# Patient Record
Sex: Male | Born: 1960 | Race: Black or African American | Hispanic: No | Marital: Married | Smoking: Current some day smoker
Health system: Southern US, Community
[De-identification: ages and names within clinical notes are randomized; demographics above are authoritative.]

## PROBLEM LIST (undated history)

## (undated) DIAGNOSIS — M1990 Unspecified osteoarthritis, unspecified site: Secondary | ICD-10-CM

## (undated) DIAGNOSIS — K769 Liver disease, unspecified: Secondary | ICD-10-CM

## (undated) DIAGNOSIS — I10 Essential (primary) hypertension: Secondary | ICD-10-CM

## (undated) DIAGNOSIS — F32A Depression, unspecified: Secondary | ICD-10-CM

## (undated) DIAGNOSIS — F419 Anxiety disorder, unspecified: Secondary | ICD-10-CM

## (undated) HISTORY — PX: JOINT REPLACEMENT: SHX530

## (undated) HISTORY — DX: Anxiety disorder, unspecified: F41.9

## (undated) HISTORY — DX: Liver disease, unspecified: K76.9

## (undated) HISTORY — DX: Depression, unspecified: F32.A

---

## 1994-07-02 HISTORY — PX: ANKLE SURGERY: SHX546

## 1999-02-14 ENCOUNTER — Emergency Department (HOSPITAL_COMMUNITY): Admission: EM | Admit: 1999-02-14 | Discharge: 1999-02-14 | Payer: Self-pay

## 1999-03-19 ENCOUNTER — Encounter: Payer: Self-pay | Admitting: Emergency Medicine

## 1999-03-19 ENCOUNTER — Emergency Department (HOSPITAL_COMMUNITY): Admission: EM | Admit: 1999-03-19 | Discharge: 1999-03-19 | Payer: Self-pay | Admitting: Emergency Medicine

## 1999-07-20 ENCOUNTER — Encounter: Payer: Self-pay | Admitting: Emergency Medicine

## 1999-07-20 ENCOUNTER — Emergency Department (HOSPITAL_COMMUNITY): Admission: EM | Admit: 1999-07-20 | Discharge: 1999-07-20 | Payer: Self-pay | Admitting: Emergency Medicine

## 2000-05-15 ENCOUNTER — Emergency Department (HOSPITAL_COMMUNITY): Admission: EM | Admit: 2000-05-15 | Discharge: 2000-05-15 | Payer: Self-pay | Admitting: Emergency Medicine

## 2001-01-06 ENCOUNTER — Emergency Department (HOSPITAL_COMMUNITY): Admission: EM | Admit: 2001-01-06 | Discharge: 2001-01-06 | Payer: Self-pay | Admitting: Emergency Medicine

## 2001-10-17 ENCOUNTER — Emergency Department (HOSPITAL_COMMUNITY): Admission: EM | Admit: 2001-10-17 | Discharge: 2001-10-17 | Payer: Self-pay

## 2002-09-01 ENCOUNTER — Emergency Department (HOSPITAL_COMMUNITY): Admission: EM | Admit: 2002-09-01 | Discharge: 2002-09-02 | Payer: Self-pay | Admitting: Emergency Medicine

## 2003-08-17 ENCOUNTER — Encounter: Admission: RE | Admit: 2003-08-17 | Discharge: 2003-11-15 | Payer: Self-pay | Admitting: Internal Medicine

## 2006-09-02 ENCOUNTER — Encounter (INDEPENDENT_AMBULATORY_CARE_PROVIDER_SITE_OTHER): Payer: Self-pay | Admitting: Cardiology

## 2006-09-02 ENCOUNTER — Inpatient Hospital Stay (HOSPITAL_COMMUNITY): Admission: EM | Admit: 2006-09-02 | Discharge: 2006-09-03 | Payer: Self-pay | Admitting: Emergency Medicine

## 2007-07-03 HISTORY — PX: UMBILICAL HERNIA REPAIR: SHX196
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
## 2008-04-23 DIAGNOSIS — F331 Major depressive disorder, recurrent, moderate: Secondary | ICD-10-CM | POA: Insufficient documentation

## 2008-04-23 DIAGNOSIS — E1169 Type 2 diabetes mellitus with other specified complication: Secondary | ICD-10-CM | POA: Insufficient documentation

## 2008-07-02 HISTORY — PX: CLAVICLE SURGERY: SHX598

## 2008-08-17 ENCOUNTER — Encounter: Payer: Self-pay | Admitting: Emergency Medicine

## 2008-08-17 ENCOUNTER — Inpatient Hospital Stay (HOSPITAL_COMMUNITY): Admission: AD | Admit: 2008-08-17 | Discharge: 2008-08-24 | Payer: Self-pay | Admitting: Orthopedic Surgery

## 2008-10-13 ENCOUNTER — Encounter: Admission: RE | Admit: 2008-10-13 | Discharge: 2009-01-11 | Payer: Self-pay | Admitting: Orthopedic Surgery

## 2008-12-17 ENCOUNTER — Encounter: Admission: RE | Admit: 2008-12-17 | Discharge: 2008-12-17 | Payer: Self-pay | Admitting: Orthopedic Surgery

## 2009-04-01 ENCOUNTER — Emergency Department (HOSPITAL_COMMUNITY): Admission: EM | Admit: 2009-04-01 | Discharge: 2009-04-01 | Payer: Self-pay | Admitting: Emergency Medicine

## 2009-04-01 ENCOUNTER — Ambulatory Visit: Payer: Self-pay | Admitting: Psychiatry

## 2009-04-01 ENCOUNTER — Inpatient Hospital Stay (HOSPITAL_COMMUNITY): Admission: RE | Admit: 2009-04-01 | Discharge: 2009-04-04 | Payer: Self-pay | Admitting: Psychiatry
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
## 2010-10-05 LAB — URINALYSIS, ROUTINE W REFLEX MICROSCOPIC
Bilirubin Urine: NEGATIVE
Ketones, ur: NEGATIVE mg/dL
Nitrite: NEGATIVE
Nitrite: NEGATIVE
Protein, ur: NEGATIVE mg/dL
Protein, ur: NEGATIVE mg/dL
Specific Gravity, Urine: 1.027 (ref 1.005–1.030)
Urobilinogen, UA: 1 mg/dL (ref 0.0–1.0)
Urobilinogen, UA: 1 mg/dL (ref 0.0–1.0)
pH: 7.5 (ref 5.0–8.0)

## 2010-10-05 LAB — POCT CARDIAC MARKERS
CKMB, poc: 2.7 ng/mL (ref 1.0–8.0)
Myoglobin, poc: 107 ng/mL (ref 12–200)
Troponin i, poc: <0.05 ng/mL (ref 0.00–0.09)

## 2010-10-05 LAB — DIFFERENTIAL
Lymphs Abs: 1 10*3/uL (ref 0.7–4.0)
Monocytes Relative: 13 % — ABNORMAL HIGH (ref 3–12)
Neutro Abs: 1.4 10*3/uL — ABNORMAL LOW (ref 1.7–7.7)
Neutrophils Relative %: 49 % (ref 43–77)

## 2010-10-05 LAB — POCT I-STAT, CHEM 8
Chloride: 106 mEq/L (ref 96–112)
Creatinine, Ser: 0.8 mg/dL (ref 0.4–1.5)
Glucose, Bld: 95 mg/dL (ref 70–99)
HCT: 39 % (ref 39.0–52.0)
Potassium: 3.3 mEq/L — ABNORMAL LOW (ref 3.5–5.1)
Sodium: 142 mEq/L (ref 135–145)

## 2010-10-05 LAB — GLUCOSE, CAPILLARY: Glucose-Capillary: 85 mg/dL (ref 70–99)

## 2010-10-05 LAB — RAPID URINE DRUG SCREEN, HOSP PERFORMED
Amphetamines: NOT DETECTED
Benzodiazepines: NOT DETECTED
Tetrahydrocannabinol: NOT DETECTED

## 2010-10-05 LAB — CBC
RBC: 4.26 MIL/uL (ref 4.22–5.81)
WBC: 2.8 10*3/uL — ABNORMAL LOW (ref 4.0–10.5)

## 2010-10-05 LAB — GC/CHLAMYDIA PROBE AMP, URINE: Chlamydia, Swab/Urine, PCR: NEGATIVE

## 2010-10-17 LAB — CBC
HCT: 31.1 % — ABNORMAL LOW (ref 39.0–52.0)
HCT: 35.4 % — ABNORMAL LOW (ref 39.0–52.0)
Hemoglobin: 10 g/dL — ABNORMAL LOW (ref 13.0–17.0)
Hemoglobin: 10.5 g/dL — ABNORMAL LOW (ref 13.0–17.0)
Hemoglobin: 12.7 g/dL — ABNORMAL LOW (ref 13.0–17.0)
MCHC: 33.7 g/dL (ref 30.0–36.0)
MCHC: 33.8 g/dL (ref 30.0–36.0)
MCHC: 33.9 g/dL (ref 30.0–36.0)
MCV: 87 fL (ref 78.0–100.0)
MCV: 87 fL (ref 78.0–100.0)
MCV: 87.2 fL (ref 78.0–100.0)
Platelets: 122 10*3/uL — ABNORMAL LOW (ref 150–400)
Platelets: 123 10*3/uL — ABNORMAL LOW (ref 150–400)
RBC: 3.41 MIL/uL — ABNORMAL LOW (ref 4.22–5.81)
RBC: 3.57 MIL/uL — ABNORMAL LOW (ref 4.22–5.81)
RBC: 4.28 MIL/uL (ref 4.22–5.81)
RDW: 12.4 % (ref 11.5–15.5)
RDW: 12.5 % (ref 11.5–15.5)
RDW: 12.8 % (ref 11.5–15.5)
RDW: 12.9 % (ref 11.5–15.5)
WBC: 4 10*3/uL (ref 4.0–10.5)
WBC: 4.9 10*3/uL (ref 4.0–10.5)

## 2010-10-17 LAB — BASIC METABOLIC PANEL
BUN: 7 mg/dL (ref 6–23)
BUN: 8 mg/dL (ref 6–23)
CO2: 27 mEq/L (ref 19–32)
CO2: 30 mEq/L (ref 19–32)
CO2: 30 mEq/L (ref 19–32)
Calcium: 8.4 mg/dL (ref 8.4–10.5)
Calcium: 8.4 mg/dL (ref 8.4–10.5)
Chloride: 100 mEq/L (ref 96–112)
Chloride: 101 mEq/L (ref 96–112)
Chloride: 97 mEq/L (ref 96–112)
GFR calc Af Amer: >60 mL/min (ref >60)
GFR calc Af Amer: >60 mL/min (ref >60)
GFR calc non Af Amer: >60 mL/min (ref >60)
GFR calc non Af Amer: >60 mL/min (ref >60)
GFR calc non Af Amer: >60 mL/min (ref >60)
Glucose, Bld: 126 mg/dL — ABNORMAL HIGH (ref 70–99)
Glucose, Bld: 130 mg/dL — ABNORMAL HIGH (ref 70–99)
Glucose, Bld: 98 mg/dL (ref 70–99)
Potassium: 3.6 mEq/L (ref 3.5–5.1)
Potassium: 3.8 mEq/L (ref 3.5–5.1)
Sodium: 133 mEq/L — ABNORMAL LOW (ref 135–145)
Sodium: 136 mEq/L (ref 135–145)
Sodium: 136 mEq/L (ref 135–145)

## 2010-10-17 LAB — GLUCOSE, CAPILLARY
Glucose-Capillary: 106 mg/dL — ABNORMAL HIGH (ref 70–99)
Glucose-Capillary: 108 mg/dL — ABNORMAL HIGH (ref 70–99)
Glucose-Capillary: 114 mg/dL — ABNORMAL HIGH (ref 70–99)
Glucose-Capillary: 115 mg/dL — ABNORMAL HIGH (ref 70–99)
Glucose-Capillary: 121 mg/dL — ABNORMAL HIGH (ref 70–99)
Glucose-Capillary: 124 mg/dL — ABNORMAL HIGH (ref 70–99)
Glucose-Capillary: 124 mg/dL — ABNORMAL HIGH (ref 70–99)
Glucose-Capillary: 146 mg/dL — ABNORMAL HIGH (ref 70–99)
Glucose-Capillary: 164 mg/dL — ABNORMAL HIGH (ref 70–99)
Glucose-Capillary: 76 mg/dL (ref 70–99)
Glucose-Capillary: 85 mg/dL (ref 70–99)
Glucose-Capillary: 86 mg/dL (ref 70–99)

## 2010-10-17 LAB — POCT I-STAT, CHEM 8
BUN: 9 mg/dL (ref 6–23)
Calcium, Ion: 1.1 mmol/L — ABNORMAL LOW (ref 1.12–1.32)
Chloride: 103 mEq/L (ref 96–112)
HCT: 44 % (ref 39.0–52.0)
Potassium: 3.4 mEq/L — ABNORMAL LOW (ref 3.5–5.1)
Sodium: 142 mEq/L (ref 135–145)

## 2010-10-17 LAB — PROTIME-INR
INR: 1.5 (ref 0.00–1.49)
INR: 1.9 — ABNORMAL HIGH (ref 0.00–1.49)
INR: 1.9 — ABNORMAL HIGH (ref 0.00–1.49)

## 2010-10-17 LAB — SAMPLE TO BLOOD BANK
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
## 2010-11-17 NOTE — Discharge Summary (Signed)
[name] [name], [name] [name]               ACCOUNT NO.:  [id number]   MEDICAL RECORD NO.:  [id number]          PATIENT TYPE:  INP   LOCATION:  1438                         FACILITY:  [facility]   PHYSICIAN:  Madaline Savage, MD        DATE OF BIRTH:  October 06, 1960   DATE OF ADMISSION:  09/01/2006  DATE OF DISCHARGE:                               DISCHARGE SUMMARY   PRIMARY CARE PHYSICIAN:  The patient is unassigned to [place].  He sees  doctors at [facility].   DISCHARGE DIAGNOSES:  1. Atypical chest pain rule out an acute coronary syndrome.  2. Hypertriglyceridemia.  3. Low HDL.  4. Borderline diabetes mellitus.   DISCHARGE MEDICATIONS:  1. Aspirin 81 mg once daily.  2. Pravastatin 20 mg once daily.  3. Xanax 0.25 mg up to t.i.d. p.r.n.   PROCEDURE PERFORMED:  He had a 2-D echocardiogram done on September 02, 2006,  which showed overall normal left ventricular systolic function with an  ejection fraction of 50-60%.  There were no regional wall motion  abnormalities in the left ventricle.   HISTORY OF PRESENT ILLNESS:  For full history and physical, see the  history and physical dictated by Dr. Roxan Hockey on September 02, 2006.  In  short, Mr. Ende is a 50 year old gentleman with a history of  borderline diabetes mellitus who comes in with chest pain a few minutes  after he had argument with his wife.  He says he had an attack of  similar pain a few years ago when it was diagnosed as a panic attack.  He was admitted for further evaluation.   PROBLEM LIST:  1. Atypical chest pain.  He was admitted, and 3 sets of cardiac      markers were done which were negative.  He had an EKG which showed      normal sinus rhythm.  We did a fasting lipid profile and a 2-D      echocardiogram for risk stratification.  His 2-D echocardiogram did      not show any left ventricular regional wall motion abnormalities.      He most likely has what has been secondary to anxiety, and he would      be started on Xanax on an  as-needed basis.  2. Hyperlipidemia.  He had elevated triglycerides of 267 and a low HDL      of 24.  He states that he has never had problem with his      cholesterol.  We will start him on pravastatin which is available      for $4.00 at the [facility] pharmacy.  3. Elevated blood sugars.  He had elevated blood sugars while he was      in the hospital.  His fasting blood sugar was up to 150s.  He      states he was being monitored for his borderline diabetes, and he      has been put on medications in the past and his blood sugars have      dropped down too low.  I would defer starting  of medications for      his diabetes for his outpatient doctor.   DISPOSITION:  He is now being discharged home in a stable condition.  He  can follow up with his regular doctors in 1-2 weeks.      Madaline Savage, MD  Electronically Signed     PKN/MEDQ  D:  09/03/2006  T:  09/03/2006  Job:  [id number]
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
## 2010-12-27 ENCOUNTER — Emergency Department (HOSPITAL_COMMUNITY)
Admission: EM | Admit: 2010-12-27 | Discharge: 2010-12-27 | Disposition: A | Discharge status: Home / Self Care | Payer: 59 | Attending: Emergency Medicine | Admitting: Emergency Medicine

## 2010-12-27 ENCOUNTER — Emergency Department (HOSPITAL_COMMUNITY): Payer: 59

## 2010-12-27 DIAGNOSIS — F410 Panic disorder [episodic paroxysmal anxiety] without agoraphobia: Secondary | ICD-10-CM | POA: Insufficient documentation

## 2010-12-27 DIAGNOSIS — R079 Chest pain, unspecified: Secondary | ICD-10-CM | POA: Insufficient documentation

## 2010-12-27 DIAGNOSIS — I10 Essential (primary) hypertension: Secondary | ICD-10-CM | POA: Insufficient documentation

## 2010-12-27 DIAGNOSIS — R0602 Shortness of breath: Secondary | ICD-10-CM | POA: Insufficient documentation

## 2010-12-27 DIAGNOSIS — M549 Dorsalgia, unspecified: Secondary | ICD-10-CM | POA: Insufficient documentation

## 2010-12-27 DIAGNOSIS — E119 Type 2 diabetes mellitus without complications: Secondary | ICD-10-CM | POA: Insufficient documentation

## 2010-12-27 LAB — COMPREHENSIVE METABOLIC PANEL
AST: 30 U/L (ref 0–37)
Albumin: 4 g/dL (ref 3.5–5.2)
Alkaline Phosphatase: 64 U/L (ref 39–117)
BUN: 12 mg/dL (ref 6–23)
Chloride: 100 mEq/L (ref 96–112)
Potassium: 3.9 mEq/L (ref 3.5–5.1)
Sodium: 136 mEq/L (ref 135–145)
Total Bilirubin: 0.5 mg/dL (ref 0.3–1.2)
Total Protein: 7.3 g/dL (ref 6.0–8.3)

## 2010-12-27 LAB — TROPONIN I: Troponin I: <0.3 ng/mL (ref <0.30)

## 2010-12-27 LAB — CBC
HCT: 36.7 % — ABNORMAL LOW (ref 39.0–52.0)
Hemoglobin: 12.3 g/dL — ABNORMAL LOW (ref 13.0–17.0)
MCV: 82.8 fL (ref 78.0–100.0)
RBC: 4.43 MIL/uL (ref 4.22–5.81)
WBC: 3.1 10*3/uL — ABNORMAL LOW (ref 4.0–10.5)

## 2010-12-27 LAB — DIFFERENTIAL
Basophils Absolute: 0 10*3/uL (ref 0.0–0.1)
Eosinophils Relative: 3 % (ref 0–5)
Lymphocytes Relative: 45 % (ref 12–46)
Lymphs Abs: 1.4 10*3/uL (ref 0.7–4.0)
Neutro Abs: 1.2 10*3/uL — ABNORMAL LOW (ref 1.7–7.7)
Neutrophils Relative %: 39 % — ABNORMAL LOW (ref 43–77)

## 2010-12-27 LAB — CK TOTAL AND CKMB (NOT AT ARMC)
CK, MB: 5 ng/mL — ABNORMAL HIGH (ref 0.3–4.0)
Relative Index: 1.9 (ref 0.0–2.5)

## 2011-03-31 ENCOUNTER — Emergency Department (HOSPITAL_COMMUNITY): Payer: 59

## 2011-03-31 ENCOUNTER — Emergency Department (HOSPITAL_COMMUNITY)
Admission: EM | Admit: 2011-03-31 | Discharge: 2011-03-31 | Disposition: A | Discharge status: Home / Self Care | Payer: 59 | Attending: Emergency Medicine | Admitting: Emergency Medicine

## 2011-03-31 DIAGNOSIS — Z79899 Other long term (current) drug therapy: Secondary | ICD-10-CM | POA: Insufficient documentation

## 2011-03-31 DIAGNOSIS — S139XXA Sprain of joints and ligaments of unspecified parts of neck, initial encounter: Secondary | ICD-10-CM | POA: Insufficient documentation

## 2011-03-31 DIAGNOSIS — M542 Cervicalgia: Secondary | ICD-10-CM | POA: Insufficient documentation

## 2011-05-06 ENCOUNTER — Emergency Department (HOSPITAL_COMMUNITY): Payer: 59

## 2011-05-06 ENCOUNTER — Emergency Department (HOSPITAL_COMMUNITY)
Admission: EM | Admit: 2011-05-06 | Discharge: 2011-05-06 | Disposition: A | Discharge status: Home / Self Care | Payer: 59 | Attending: Emergency Medicine | Admitting: Emergency Medicine

## 2011-05-06 ENCOUNTER — Encounter: Payer: Self-pay | Admitting: *Deleted

## 2011-05-06 DIAGNOSIS — IMO0002 Reserved for concepts with insufficient information to code with codable children: Secondary | ICD-10-CM | POA: Insufficient documentation

## 2011-05-06 DIAGNOSIS — S161XXA Strain of muscle, fascia and tendon at neck level, initial encounter: Secondary | ICD-10-CM

## 2011-05-06 DIAGNOSIS — S139XXA Sprain of joints and ligaments of unspecified parts of neck, initial encounter: Secondary | ICD-10-CM | POA: Insufficient documentation

## 2011-05-06 DIAGNOSIS — M542 Cervicalgia: Secondary | ICD-10-CM | POA: Insufficient documentation

## 2011-05-06 HISTORY — DX: Essential (primary) hypertension: I10

## 2011-05-06 MED ORDER — OXYCODONE-ACETAMINOPHEN 5-325 MG PO TABS: 1.0000 | ORAL_TABLET | Freq: Four times a day (QID) | ORAL | Status: AC | PRN

## 2011-05-06 NOTE — ED Notes (Signed)
Patient instructed to home BP medication and follow up with primary care provider ([facility]).
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
## 2011-09-24 ENCOUNTER — Emergency Department (HOSPITAL_COMMUNITY)
Admission: EM | Admit: 2011-09-24 | Discharge: 2011-09-25 | Disposition: A | Discharge status: Home / Self Care | Payer: 59 | Attending: Emergency Medicine | Admitting: Emergency Medicine

## 2011-09-24 DIAGNOSIS — E119 Type 2 diabetes mellitus without complications: Secondary | ICD-10-CM | POA: Insufficient documentation

## 2011-09-24 DIAGNOSIS — Z7982 Long term (current) use of aspirin: Secondary | ICD-10-CM | POA: Insufficient documentation

## 2011-09-24 DIAGNOSIS — R51 Headache: Secondary | ICD-10-CM | POA: Insufficient documentation

## 2011-09-24 DIAGNOSIS — R002 Palpitations: Secondary | ICD-10-CM

## 2011-09-24 DIAGNOSIS — R0602 Shortness of breath: Secondary | ICD-10-CM | POA: Insufficient documentation

## 2011-09-24 DIAGNOSIS — I10 Essential (primary) hypertension: Secondary | ICD-10-CM | POA: Insufficient documentation

## 2011-09-24 DIAGNOSIS — Z79899 Other long term (current) drug therapy: Secondary | ICD-10-CM | POA: Insufficient documentation

## 2011-09-25 ENCOUNTER — Emergency Department (HOSPITAL_COMMUNITY): Payer: 59

## 2011-09-25 ENCOUNTER — Encounter (HOSPITAL_COMMUNITY): Payer: Self-pay | Admitting: Emergency Medicine

## 2011-09-25 ENCOUNTER — Other Ambulatory Visit: Payer: Self-pay

## 2011-09-25 LAB — CBC
MCH: 28.9 pg (ref 26.0–34.0)
MCHC: 33.9 g/dL (ref 30.0–36.0)
MCV: 85 fL (ref 78.0–100.0)
Platelets: 163 10*3/uL (ref 150–400)
RDW: 12.4 % (ref 11.5–15.5)

## 2011-09-25 LAB — GLUCOSE, CAPILLARY: Glucose-Capillary: 191 mg/dL — ABNORMAL HIGH (ref 70–99)

## 2011-09-25 LAB — COMPREHENSIVE METABOLIC PANEL
AST: 28 U/L (ref 0–37)
Albumin: 3.7 g/dL (ref 3.5–5.2)
Calcium: 9.1 mg/dL (ref 8.4–10.5)
Creatinine, Ser: 1.3 mg/dL (ref 0.50–1.35)
GFR calc non Af Amer: 63 mL/min — ABNORMAL LOW (ref >90)
Total Protein: 7.4 g/dL (ref 6.0–8.3)

## 2011-09-25 LAB — POCT I-STAT, CHEM 8
Chloride: 103 mEq/L (ref 96–112)
Creatinine, Ser: 1.3 mg/dL (ref 0.50–1.35)
HCT: 41 % (ref 39.0–52.0)
Hemoglobin: 13.9 g/dL (ref 13.0–17.0)
Potassium: 3.5 mEq/L (ref 3.5–5.1)
Sodium: 141 mEq/L (ref 135–145)

## 2011-09-25 LAB — URINALYSIS, ROUTINE W REFLEX MICROSCOPIC
Glucose, UA: NEGATIVE mg/dL
Hgb urine dipstick: NEGATIVE
Specific Gravity, Urine: 1.026 (ref 1.005–1.030)
Urobilinogen, UA: 0.2 mg/dL (ref 0.0–1.0)
pH: 5.5 (ref 5.0–8.0)

## 2011-09-25 NOTE — ED Notes (Signed)
Pt states he is feeling short of breath and headache that is resolved at this time but continues to feel weak and having blurred vision  Sxs started earlier today around 1pm

## 2011-09-25 NOTE — ED Notes (Signed)
CBG 191 

## 2011-09-25 NOTE — Discharge Instructions (Signed)
Palpitations  A palpitation is the feeling that your heartbeat is irregular or is faster than normal. Although this is frightening, it usually is not serious. Palpitations may be caused by excesses of smoking, caffeine, or alcohol. They are also brought on by stress and anxiety. Sometimes, they are caused by heart disease. Unless otherwise noted, your caregiver did not find any signs of serious illness at this time. HOME CARE INSTRUCTIONS  To help prevent palpitations:  Drink decaffeinated coffee, tea, and soda pop. Avoid chocolate.   If you smoke or drink alcohol, quit or cut down as much as possible.   Reduce your stress or anxiety level. Biofeedback, yoga, or meditation will help you relax. Physical activity such as swimming, jogging, or walking also may be helpful.  SEEK MEDICAL CARE IF:   You continue to have a fast heartbeat.   Your palpitations occur more often.  SEEK IMMEDIATE MEDICAL CARE IF: You develop chest pain, shortness of breath, severe headache, dizziness, or fainting.   Dehydration, Adult  Dehydration is when you lose more fluids from the body than you take in. Vital organs like the kidneys, brain, and heart cannot function without a proper amount of fluids and salt. Any loss of fluids from the body can cause dehydration.  CAUSES  Vomiting.  Diarrhea.  Excessive sweating.  Excessive urine output.  Fever.  SYMPTOMS  Mild dehydration  Thirst.  Dry lips.  Slightly dry mouth.  Moderate dehydration  Very dry mouth.  Sunken eyes.  Skin does not bounce back quickly when lightly pinched and released.  Dark urine and decreased urine production.  Decreased tear production.  Headache.  Severe dehydration  Very dry mouth.  Extreme thirst.  Rapid, weak pulse (more than 100 beats per minute at rest).  Cold hands and feet.  Not able to sweat in spite of heat and temperature.  Rapid breathing.  Blue lips.  Confusion and lethargy.  Difficulty being awakened.  Minimal  urine production.  No tears.  DIAGNOSIS  Your caregiver will diagnose dehydration based on your symptoms and your exam. Blood and urine tests will help confirm the diagnosis. The diagnostic evaluation should also identify the cause of dehydration.  TREATMENT  Treatment of mild or moderate dehydration can often be done at home by increasing the amount of fluids that you drink. It is best to drink small amounts of fluid more often. Drinking too much at one time can make vomiting worse. Refer to the home care instructions below.  Severe dehydration needs to be treated at the hospital where you will probably be given intravenous (IV) fluids that contain water and electrolytes.  HOME CARE INSTRUCTIONS  Ask your caregiver about specific rehydration instructions.  Drink enough fluids to keep your urine clear or pale yellow.  Drink small amounts frequently if you have nausea and vomiting.  Eat as you normally do.  Avoid:  Foods or drinks high in sugar.  Carbonated drinks.  Juice.  Extremely hot or cold fluids.  Drinks with caffeine.  Fatty, greasy foods.  Alcohol.  Tobacco.  Overeating.  Gelatin desserts.  Wash your hands well to avoid spreading bacteria and viruses.  Only take over-the-counter or prescription medicines for pain, discomfort, or fever as directed by your caregiver.  Ask your caregiver if you should continue all prescribed and over-the-counter medicines.  Keep all follow-up appointments with your caregiver.  SEEK MEDICAL CARE IF:  You have abdominal pain and it increases or stays in one area (localizes).  You have  a rash, stiff neck, or severe headache.  You are irritable, sleepy, or difficult to awaken.  You are weak, dizzy, or extremely thirsty.  SEEK IMMEDIATE MEDICAL CARE IF:  You are unable to keep fluids down or you get worse despite treatment.  You have frequent episodes of vomiting or diarrhea.  You have blood or green matter (bile) in your vomit.  You have blood in  your stool or your stool looks black and tarry.  You have not urinated in 6 to 8 hours, or you have only urinated a small amount of very dark urine.  You have a fever.  You faint.  MAKE SURE YOU:  Understand these instructions.  Will watch your condition.  Will get help right away if you are not doing well or get worse.

## 2011-09-25 NOTE — ED Provider Notes (Signed)
History     CSN: [id number]  Arrival date & time 09/24/11  2334   First MD Initiated Contact with Patient 09/25/11 0253      Chief Complaint  Patient presents with  . Shortness of Breath  . Headache    (Consider location/radiation/quality/duration/timing/severity/associated sxs/prior treatment) The history is provided by the patient.   patient had a mild headache earlier today otherwise feeling fine. He went to work and developed some shortness of breath with palpitations. This lasted about an hour until he came to emergency department and have resolved. He denies any history of anxiety or panic attacks although has had similar symptoms previously. No history of thyroid problems. He denies any drug or alcohol use. No excess caffeine today. No cough cold or congestion. No chest pains. No fevers or chills. No new medications. Patient is treated for hypertension and diabetes without any history of heart disease. At time of evaluation patient is asymptomatic without complaints. No leg pain or swelling.  Past Medical History  Diagnosis Date  . Hypertension   . Diabetes mellitus     Past Surgical History  Procedure Date  . Clavicle surgery 2010  . Joint replacement     metal plate in knee    Family History  Problem Relation Age of Onset  . Diabetes Other   . Hypertension Other   . Leukemia Other     History  Substance Use Topics  . Smoking status: Never Smoker   . Smokeless tobacco: Not on file  . Alcohol Use: No      Review of Systems  Constitutional: Negative for fever and chills.  HENT: Negative for neck pain and neck stiffness.   Eyes: Negative for pain.  Respiratory: Negative for apnea, cough, chest tightness and wheezing.   Cardiovascular: Positive for palpitations. Negative for chest pain.  Gastrointestinal: Negative for abdominal pain.  Genitourinary: Negative for dysuria.  Musculoskeletal: Negative for back pain.  Skin: Negative for rash.  Neurological:  Positive for headaches. Negative for seizures, syncope, speech difficulty, weakness and light-headedness.  All other systems reviewed and are negative.    Allergies  Review of patient's allergies indicates no known allergies.  Home Medications   Current Outpatient Rx  Name Route Sig Dispense Refill  . ASPIRIN EC 81 MG PO TBEC Oral Take 81 mg by mouth daily.      . CYCLOBENZAPRINE HCL 10 MG PO TABS Oral Take 10 mg by mouth 3 (three) times daily as needed.    Marland Kitchen DICLOFENAC SODIUM 75 MG PO TBEC Oral Take 75 mg by mouth 2 (two) times daily.    Marland Kitchen LOSARTAN POTASSIUM-HCTZ 100-12.5 MG PO TABS Oral Take 1 tablet by mouth daily.    Marland Kitchen METFORMIN HCL 500 MG PO TABS Oral Take 500 mg by mouth daily with breakfast.    . THERA M PLUS PO TABS Oral Take 1 tablet by mouth daily.      Marland Kitchen PREDNISONE 20 MG PO TABS Oral Take 20 mg by mouth daily.    Marland Kitchen VITAMIN B-12 100 MCG PO TABS Oral Take 50 mcg by mouth daily.      Marland Kitchen METHOCARBAMOL 500 MG PO TABS Oral Take 500 mg by mouth daily.      BP 117/79  Pulse 85  Temp(Src) 98.6 F (37 C) (Oral)  Resp 18  SpO2 100%  Physical Exam  Constitutional: He is oriented to person, place, and time. He appears well-developed and well-nourished.  HENT:  Head: Normocephalic and atraumatic.  Eyes:  Conjunctivae and EOM are normal. Pupils are equal, round, and reactive to light.  Neck: Trachea normal. Neck supple. No thyromegaly present.  Cardiovascular: Normal rate, regular rhythm, S1 normal, S2 normal and normal pulses.     No systolic murmur is present   No diastolic murmur is present  Pulses:      Radial pulses are 2+ on the right side, and 2+ on the left side.  Pulmonary/Chest: Effort normal and breath sounds normal. He has no wheezes. He has no rhonchi. He has no rales. He exhibits no tenderness.  Abdominal: Soft. Normal appearance and bowel sounds are normal. There is no tenderness. There is no CVA tenderness and negative Murphy's sign.  Musculoskeletal:        BLE:s Calves nontender, no cords or erythema, negative Homans sign  Neurological: He is alert and oriented to person, place, and time. He has normal strength. No cranial nerve deficit or sensory deficit. GCS eye subscore is 4. GCS verbal subscore is 5. GCS motor subscore is 6.  Skin: Skin is warm and dry. No rash noted. He is not diaphoretic.  Psychiatric: His speech is normal.       Cooperative and appropriate    ED Course  Procedures (including critical care time)  Results for orders placed during the hospital encounter of 09/24/11  GLUCOSE, CAPILLARY      Component Value Range   Glucose-Capillary 191 (*) 70 - 99 (mg/dL)   Comment 1 Notify RN    CBC      Component Value Range   WBC 5.0  4.0 - 10.5 (K/uL)   RBC 4.54  4.22 - 5.81 (MIL/uL)   Hemoglobin 13.1  13.0 - 17.0 (g/dL)   HCT 16.1 (*) 09.6 - 52.0 (%)   MCV 85.0  78.0 - 100.0 (fL)   MCH 28.9  26.0 - 34.0 (pg)   MCHC 33.9  30.0 - 36.0 (g/dL)   RDW 04.5  40.9 - 81.1 (%)   Platelets 163  150 - 400 (K/uL)  COMPREHENSIVE METABOLIC PANEL      Component Value Range   Sodium 137  135 - 145 (mEq/L)   Potassium 3.5  3.5 - 5.1 (mEq/L)   Chloride 101  96 - 112 (mEq/L)   CO2 28  19 - 32 (mEq/L)   Glucose, Bld 189 (*) 70 - 99 (mg/dL)   BUN 25 (*) 6 - 23 (mg/dL)   Creatinine, Ser 9.14  0.50 - 1.35 (mg/dL)   Calcium 9.1  8.4 - 78.2 (mg/dL)   Total Protein 7.4  6.0 - 8.3 (g/dL)   Albumin 3.7  3.5 - 5.2 (g/dL)   AST 28  0 - 37 (U/L)   ALT 29  0 - 53 (U/L)   Alkaline Phosphatase 59  39 - 117 (U/L)   Total Bilirubin 0.3  0.3 - 1.2 (mg/dL)   GFR calc non Af Amer 63 (*) >90 (mL/min)   GFR calc Af Amer 73 (*) >90 (mL/min)  URINALYSIS, ROUTINE W REFLEX MICROSCOPIC      Component Value Range   Color, Urine YELLOW  YELLOW    APPearance CLEAR  CLEAR    Specific Gravity, Urine 1.026  1.005 - 1.030    pH 5.5  5.0 - 8.0    Glucose, UA NEGATIVE  NEGATIVE (mg/dL)   Hgb urine dipstick NEGATIVE  NEGATIVE    Bilirubin Urine NEGATIVE  NEGATIVE      Ketones, ur TRACE (*) NEGATIVE (mg/dL)   Protein, ur NEGATIVE  NEGATIVE (mg/dL)   Urobilinogen, UA 0.2  0.0 - 1.0 (mg/dL)   Nitrite NEGATIVE  NEGATIVE    Leukocytes, UA NEGATIVE  NEGATIVE   POCT I-STAT, CHEM 8      Component Value Range   Sodium 141  135 - 145 (mEq/L)   Potassium 3.5  3.5 - 5.1 (mEq/L)   Chloride 103  96 - 112 (mEq/L)   BUN 30 (*) 6 - 23 (mg/dL)   Creatinine, Ser 0.45  0.50 - 1.35 (mg/dL)   Glucose, Bld 409 (*) 70 - 99 (mg/dL)   Calcium, Ion 8.11  9.14 - 1.32 (mmol/L)   TCO2 28  0 - 100 (mmol/L)   Hemoglobin 13.9  13.0 - 17.0 (g/dL)   HCT 78.2  95.6 - 21.3 (%)   Dg Chest 2 View  09/25/2011  *RADIOLOGY REPORT*  Clinical Data: Shortness of breath and headache.  CHEST - 2 VIEW  Comparison: Chest radiograph performed 12/27/2010  Findings: The lungs are relatively well-aerated.  Mild bibasilar opacities likely reflect atelectasis.  There is no evidence of pleural effusion or pneumothorax.  The heart is normal in size; the mediastinal contour is within normal limits.  No acute osseous abnormalities are seen.  A plate and screws are noted along the left clavicle.  IMPRESSION: Mild bibasilar opacities likely reflect atelectasis; lungs otherwise clear.  Original Report Authenticated By: Tonia Ghent, M.D.     Date: 09/25/2011  Rate: 82  Rhythm: normal sinus rhythm  QRS Axis: left  Intervals: normal  ST/T Wave abnormalities: nonspecific ST changes  Conduction Disutrbances:none  Narrative Interpretation: LVH  Old EKG Reviewed: unchanged   Orthostatics vital signs reviewed by mouth fluids provided. No Indication IV fluid hydration.  MDM   Brief episode of palpitations with shortness of breath resolved prior to arrival. Symptoms do not suggest ACS - no associated chest pain, nausea or diaphoresis. Screening EKG reviewed as above. Labs obtained and reviewed. No anemia. no significant electrolyte abnormality. Patient is followed by primary care [facility] and he agrees  to outpatient followup for it hours for recheck. Work note provided.  reliable historian and verbalizes understanding of all discharge and followup instructions.        Sunnie Nielsen, MD 09/25/11 [phone]
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
## 2012-03-19 ENCOUNTER — Emergency Department (HOSPITAL_COMMUNITY)
Admission: EM | Admit: 2012-03-19 | Discharge: 2012-03-19 | Disposition: A | Discharge status: Home / Self Care | Payer: No Typology Code available for payment source | Attending: Emergency Medicine | Admitting: Emergency Medicine

## 2012-03-19 ENCOUNTER — Emergency Department (HOSPITAL_COMMUNITY): Payer: No Typology Code available for payment source

## 2012-03-19 DIAGNOSIS — Y92410 Unspecified street and highway as the place of occurrence of the external cause: Secondary | ICD-10-CM | POA: Insufficient documentation

## 2012-03-19 DIAGNOSIS — E119 Type 2 diabetes mellitus without complications: Secondary | ICD-10-CM | POA: Insufficient documentation

## 2012-03-19 DIAGNOSIS — Z8249 Family history of ischemic heart disease and other diseases of the circulatory system: Secondary | ICD-10-CM | POA: Insufficient documentation

## 2012-03-19 DIAGNOSIS — M25569 Pain in unspecified knee: Secondary | ICD-10-CM | POA: Insufficient documentation

## 2012-03-19 DIAGNOSIS — I10 Essential (primary) hypertension: Secondary | ICD-10-CM | POA: Insufficient documentation

## 2012-03-19 DIAGNOSIS — Z8489 Family history of other specified conditions: Secondary | ICD-10-CM | POA: Insufficient documentation

## 2012-03-19 DIAGNOSIS — Z7982 Long term (current) use of aspirin: Secondary | ICD-10-CM | POA: Insufficient documentation

## 2012-03-19 DIAGNOSIS — Z833 Family history of diabetes mellitus: Secondary | ICD-10-CM | POA: Insufficient documentation

## 2012-03-19 LAB — GLUCOSE, CAPILLARY: Glucose-Capillary: 342 mg/dL — ABNORMAL HIGH (ref 70–99)

## 2012-03-19 MED ORDER — INSULIN REGULAR HUMAN 100 UNIT/ML IJ SOLN: 6.0000 [IU] | Freq: Once | INTRAMUSCULAR | Status: DC

## 2012-03-19 MED ORDER — OXYCODONE-ACETAMINOPHEN 5-325 MG PO TABS: 2.0000 | ORAL_TABLET | ORAL | Status: DC | PRN

## 2012-03-19 MED ORDER — INSULIN ASPART 100 UNIT/ML ~~LOC~~ SOLN
6.0000 [IU] | Freq: Once | SUBCUTANEOUS | Status: AC
  Administered 2012-03-19: 6 [IU] via SUBCUTANEOUS
  Filled 2012-03-19: qty 1

## 2012-03-19 MED ORDER — LISINOPRIL 20 MG PO TABS
20.0000 mg | ORAL_TABLET | Freq: Once | ORAL | Status: DC
  Filled 2012-03-19: qty 1

## 2012-03-19 MED ORDER — OXYCODONE-ACETAMINOPHEN 5-325 MG PO TABS
1.0000 | ORAL_TABLET | Freq: Once | ORAL | Status: AC
  Administered 2012-03-19: 1 via ORAL
  Filled 2012-03-19: qty 1
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
## 2012-03-19 NOTE — ED Notes (Signed)
Pt back from X-Ray.  
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
## 2012-03-19 NOTE — ED Notes (Signed)
Pt reports quick headache, eyes started hurting, and vomiting on site.

## 2012-03-20 NOTE — ED Provider Notes (Signed)
Medical screening examination/treatment/procedure(s) were performed by non-physician practitioner and as supervising physician I was immediately available for consultation/collaboration.   Glynn Octave, MD 03/20/12 [phone]
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
## 2012-08-22 ENCOUNTER — Encounter (HOSPITAL_COMMUNITY): Payer: Self-pay | Admitting: Emergency Medicine

## 2012-08-22 ENCOUNTER — Emergency Department (HOSPITAL_COMMUNITY)
Admission: EM | Admit: 2012-08-22 | Discharge: 2012-08-23 | Disposition: A | Discharge status: Home / Self Care | Payer: 59 | Attending: Emergency Medicine | Admitting: Emergency Medicine

## 2012-08-22 DIAGNOSIS — E119 Type 2 diabetes mellitus without complications: Secondary | ICD-10-CM | POA: Insufficient documentation

## 2012-08-22 DIAGNOSIS — Z7982 Long term (current) use of aspirin: Secondary | ICD-10-CM | POA: Insufficient documentation

## 2012-08-22 DIAGNOSIS — I10 Essential (primary) hypertension: Secondary | ICD-10-CM

## 2012-08-22 DIAGNOSIS — Z79899 Other long term (current) drug therapy: Secondary | ICD-10-CM | POA: Insufficient documentation

## 2012-08-22 DIAGNOSIS — K4090 Unilateral inguinal hernia, without obstruction or gangrene, not specified as recurrent: Secondary | ICD-10-CM | POA: Insufficient documentation

## 2012-08-22 MED ORDER — OXYCODONE-ACETAMINOPHEN 5-325 MG PO TABS
1.0000 | ORAL_TABLET | Freq: Once | ORAL | Status: AC
  Administered 2012-08-22: 1 via ORAL
  Filled 2012-08-22: qty 1

## 2012-08-22 NOTE — ED Notes (Signed)
PT. REPORTS PROGRESSING RIGHT GROIN PAIN FOR SEVERAL DAYS WORSE WITH HEAVY LIFTING/BENDING , DENIES FALL OR INJURY. PT. TAKEN BC POWDER AND TYLENOL WITH SLIGHT RELIEF. HYPERTENSIVE AT TRIAGE.

## 2012-08-23 MED ORDER — HYDROCODONE-ACETAMINOPHEN 5-325 MG PO TABS: 1.0000 | ORAL_TABLET | Freq: Four times a day (QID) | ORAL | Status: DC | PRN

## 2012-08-23 MED ORDER — POLYETHYLENE GLYCOL 3350 17 G PO PACK: 17.0000 g | PACK | Freq: Every day | ORAL | Status: DC

## 2012-08-23 NOTE — ED Provider Notes (Signed)
Medical screening examination/treatment/procedure(s) were performed by non-physician practitioner and as supervising physician I was immediately available for consultation/collaboration.  Sunnie Nielsen, MD 08/23/12 [phone]

## 2012-08-23 NOTE — ED Provider Notes (Signed)
History     CSN: [id number]  Arrival date & time 08/22/12  2111   First MD Initiated Contact with Patient 08/22/12 2331      Chief Complaint  Patient presents with  . Groin Pain    (Consider location/radiation/quality/duration/timing/severity/associated sxs/prior treatment) HPI Comments: [name] [name] is a 52 y.o. male with a history of diabetes and hypertension presents to the emergency department with a chief complaint of right groin and pain.  Patient explains that 15 years ago when he would work out he would get intermittent bulge when lifting weights, however this resolved on its own and was never evaluated.  1-2 weeks ago patient states that a similar sensation begin to occur when lifting heavy objects at work.  Pain typically only lasts one heavy lifting or bending but has been progressively worsening.  Patient denies any current bulge. Pain severity is 3/10 now, but worsens with lifting. No known trama, testicular pain, testicular swelling, penile discharge, dysuria, hematuria, back pain.  Pain mildly relieved by [facility] powder and Tylenol.  Patient is a 52 y.o. male presenting with groin pain. The history is provided by the patient.  Groin Pain    Past Medical History  Diagnosis Date  . Hypertension   . Diabetes mellitus     Past Surgical History  Procedure Laterality Date  . Clavicle surgery  2010  . Joint replacement      metal plate in knee    Family History  Problem Relation Age of Onset  . Diabetes Other   . Hypertension Other   . Leukemia Other     History  Substance Use Topics  . Smoking status: Never Smoker   . Smokeless tobacco: Not on file  . Alcohol Use: No      Review of Systems  All other systems reviewed and are negative.    Allergies  Review of patient's allergies indicates no known allergies.  Home Medications   Current Outpatient Rx  Name  Route  Sig  Dispense  Refill  . aspirin EC 81 MG tablet   Oral   Take 81 mg by mouth daily.            . cyclobenzaprine (FLEXERIL) 10 MG tablet   Oral   Take 10 mg by mouth 3 (three) times daily as needed. Muscle spasm         . fish oil-omega-3 fatty acids 1000 MG capsule   Oral   Take 1 g by mouth daily.         Marland Kitchen glipiZIDE-metformin (METAGLIP) 5-500 MG per tablet   Oral   Take 1 tablet by mouth 2 (two) times daily before a meal.         . losartan (COZAAR) 100 MG tablet   Oral   Take 100 mg by mouth daily.         . Multiple Vitamins-Minerals (MULTIVITAMINS THER. W/MINERALS) TABS   Oral   Take 1 tablet by mouth daily.          . vitamin B-12 (CYANOCOBALAMIN) 100 MCG tablet   Oral   Take 50 mcg by mouth daily.            BP 208/122  Pulse 80  Temp(Src) 98.5 F (36.9 C) (Oral)  SpO2 97%  Physical Exam  Nursing note and vitals reviewed. Constitutional: He is oriented to person, place, and time. He appears well-developed and well-nourished. No distress.  HENT:  Head: Normocephalic and atraumatic.  Eyes: Conjunctivae and EOM are  normal.  Neck: Normal range of motion.  Pulmonary/Chest: Effort normal.  Abdominal: A hernia is present. Hernia confirmed positive in the right inguinal area.  Genitourinary: Testes normal and penis normal.    Cremasteric reflex is present. Right testis shows no swelling and no tenderness.  Musculoskeletal: Normal range of motion.  Neurological: He is alert and oriented to person, place, and time.  Skin: Skin is warm and dry. No rash noted. He is not diaphoretic.  Psychiatric: He has a normal mood and affect. His behavior is normal.    ED Course  Procedures (including critical care time)  Labs Reviewed - No data to display No results found.   No diagnosis found.    MDM  Inguinal hernia, clinically Hypertension   Patient has been diagnosed clinically with an inguinal hernia.  Thorough instructions upon discharge have been discussed including constipation precautions, no lifting heavy objects greater than  10 pounds, home reduction using gravity, and followup with general surgery recommended.  Discussed signs and symptoms of incarceration and strict return precautions.  Patient verbalizes understanding.  Will discharge with short course of pain or prescription and that the patient can use as needed. Pt has also presented with hypertension BP 208/122, he states that he is currently taking Losartan but did not take is second dose. He denies any HA or back pain. ADvised PCP f-u for re-check in the next 2 days. At this time there does not appear to be any evidence of an acute emergency medical condition and the patient appears stable for discharge with appropriate outpatient follow up. Diagnosis was discussed with patient who verbalizes understanding and is agreeable to discharge. Pt case discussed with Dr. Dierdre Highman who agrees with my plan.          Jaci Carrel, [place] 08/23/12 0030

## 2012-09-02 ENCOUNTER — Encounter (INDEPENDENT_AMBULATORY_CARE_PROVIDER_SITE_OTHER): Payer: Self-pay | Admitting: General Surgery

## 2012-09-02 ENCOUNTER — Ambulatory Visit (INDEPENDENT_AMBULATORY_CARE_PROVIDER_SITE_OTHER): Payer: 59 | Admitting: General Surgery

## 2012-09-02 VITALS — BP 130/82 | HR 84 | Temp 97.9°F | Resp 14 | Ht 70.0 in | Wt 226.0 lb

## 2012-09-02 DIAGNOSIS — S76219A Strain of adductor muscle, fascia and tendon of unspecified thigh, initial encounter: Secondary | ICD-10-CM

## 2012-09-02 DIAGNOSIS — IMO0002 Reserved for concepts with insufficient information to code with codable children: Secondary | ICD-10-CM

## 2012-09-02 NOTE — Progress Notes (Signed)
The patient comes in today with the diagnoses of writing the hernia after being seen in the emergency department recently.  The patient states that his had symptoms of a hernia in that area for over 15 years. It has become more acute in recent months.  The patient does a lot of heavy lifting at his job. The discomfort appears to be exacerbated with lifting. He describes the pain as being in the right inguinal area radiating down the right scrotum and on the medial aspect of his right thigh. He has described there is a knot in that area on occasion.  On my examination today I cannot identify all feel a hernia on either side. I did the examination with the patient in the standing position with proper Valsalva and coughing. I have the patient reexamined by one of my partners to make sure that was not overlooking some type of bulge. No hernia was identified by my partner.  The cause of the patient's discomfort is unknown and may be musculoskeletal strain versus something related to his back and/or his testicle I did not palpate a mass in his testicle or any significant pathology. He needs primary care followup and I would suggest in nature him to see. In the meantime I recommended that he continue to work as I cannot assign him disability based on our current working diagnosis of inguinal strain without hernia. He can return to see me on an as-needed basis however it would be best if he can be seen at the time that an actual bulge can be palpated.
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
## 2013-03-20 ENCOUNTER — Encounter: Payer: Self-pay | Admitting: Neurology

## 2013-03-23 ENCOUNTER — Encounter: Payer: 59 | Admitting: Neurology

## 2013-05-05 ENCOUNTER — Other Ambulatory Visit (HOSPITAL_COMMUNITY): Payer: Self-pay | Admitting: Orthopaedic Surgery

## 2013-05-07 ENCOUNTER — Encounter (HOSPITAL_COMMUNITY): Payer: Self-pay | Admitting: Pharmacy Technician

## 2013-05-07 ENCOUNTER — Other Ambulatory Visit (HOSPITAL_COMMUNITY): Payer: Self-pay | Admitting: *Deleted

## 2013-05-11 ENCOUNTER — Ambulatory Visit (HOSPITAL_COMMUNITY)
Admission: RE | Admit: 2013-05-11 | Discharge: 2013-05-11 | Disposition: A | Discharge status: Home / Self Care | Payer: BC Managed Care – PPO | Source: Ambulatory Visit | Attending: Orthopaedic Surgery | Admitting: Orthopaedic Surgery

## 2013-05-11 ENCOUNTER — Encounter (HOSPITAL_COMMUNITY): Payer: Self-pay

## 2013-05-11 ENCOUNTER — Encounter (HOSPITAL_COMMUNITY)
Admission: RE | Admit: 2013-05-11 | Discharge: 2013-05-11 | Disposition: A | Discharge status: Home / Self Care | Payer: BC Managed Care – PPO | Source: Ambulatory Visit | Attending: Orthopaedic Surgery | Admitting: Orthopaedic Surgery

## 2013-05-11 DIAGNOSIS — Z01818 Encounter for other preprocedural examination: Secondary | ICD-10-CM | POA: Insufficient documentation

## 2013-05-11 DIAGNOSIS — E119 Type 2 diabetes mellitus without complications: Secondary | ICD-10-CM | POA: Insufficient documentation

## 2013-05-11 DIAGNOSIS — Z01810 Encounter for preprocedural cardiovascular examination: Secondary | ICD-10-CM | POA: Insufficient documentation

## 2013-05-11 DIAGNOSIS — M1610 Unilateral primary osteoarthritis, unspecified hip: Secondary | ICD-10-CM | POA: Insufficient documentation

## 2013-05-11 DIAGNOSIS — Z01812 Encounter for preprocedural laboratory examination: Secondary | ICD-10-CM | POA: Insufficient documentation

## 2013-05-11 DIAGNOSIS — I10 Essential (primary) hypertension: Secondary | ICD-10-CM | POA: Insufficient documentation

## 2013-05-11 DIAGNOSIS — M169 Osteoarthritis of hip, unspecified: Secondary | ICD-10-CM | POA: Insufficient documentation

## 2013-05-11 HISTORY — DX: Unspecified osteoarthritis, unspecified site: M19.90

## 2013-05-11 LAB — URINALYSIS, ROUTINE W REFLEX MICROSCOPIC
Glucose, UA: >1000 mg/dL — AB
Leukocytes, UA: NEGATIVE
Protein, ur: NEGATIVE mg/dL
Specific Gravity, Urine: >1.046 — ABNORMAL HIGH (ref 1.005–1.030)

## 2013-05-11 LAB — BASIC METABOLIC PANEL
BUN: 11 mg/dL (ref 6–23)
Calcium: 9.7 mg/dL (ref 8.4–10.5)
Chloride: 102 mEq/L (ref 96–112)
GFR calc Af Amer: >90 mL/min (ref >90)
GFR calc non Af Amer: >90 mL/min (ref >90)
Potassium: 3.3 mEq/L — ABNORMAL LOW (ref 3.5–5.1)
Sodium: 137 mEq/L (ref 135–145)

## 2013-05-11 LAB — URINE MICROSCOPIC-ADD ON

## 2013-05-11 LAB — CBC
HCT: 41 % (ref 39.0–52.0)
Hemoglobin: 14 g/dL (ref 13.0–17.0)
MCHC: 34.1 g/dL (ref 30.0–36.0)
Platelets: 160 10*3/uL (ref 150–400)
RBC: 4.89 MIL/uL (ref 4.22–5.81)
WBC: 3.9 10*3/uL — ABNORMAL LOW (ref 4.0–10.5)

## 2013-05-11 LAB — ABO/RH: ABO/RH(D): B POS

## 2013-05-11 NOTE — Progress Notes (Signed)
abnormal BMET / UA faxed to Dr. Maureen Ralphs

## 2013-05-11 NOTE — Patient Instructions (Addendum)
[name] [name]  05/11/2013                           YOUR PROCEDURE IS SCHEDULED ON:  05/15/13               PLEASE REPORT TO SHORT STAY CENTER AT : 12:15 pm               CALL THIS NUMBER IF ANY PROBLEMS THE DAY OF SURGERY :               [phone]                      REMEMBER:   Do not eat food or drink liquids AFTER MIDNIGHT  May have clear liquids UNTIL 6 HOURS BEFORE SURGERY (8:40 am)  Clear liquids include soda, tea, black coffee, apple or grape juice, broth.  Take these medicines the morning of surgery with A SIP OF WATER:  AMLODIPINE   Do not wear jewelry, make-up   Do not wear lotions, powders, or perfumes.   Do not shave legs or underarms 12 hrs. before surgery (men may shave face)  Do not bring valuables to the hospital.  Contacts, dentures or bridgework may not be worn into surgery.  Leave suitcase in the car. After surgery it may be brought to your room.  For patients admitted to the hospital more than one night, checkout time is 11:00                          The day of discharge.   Patients discharged the day of surgery will not be allowed to drive home                             If going home same day of surgery, must have someone stay with you first                           24 hrs at home and arrange for some one to drive you home from hospital.    Special Instructions:   Please read over the following fact sheets that you were given:               1. MRSA  INFORMATION                      2.  PREPARING FOR SURGERY SHEET                                                X_____________________________________________________________________        Failure to follow these instructions may result in cancellation of your surgery

## 2013-05-11 NOTE — Progress Notes (Signed)
05/11/13 1322  OBSTRUCTIVE SLEEP APNEA  Have you ever been diagnosed with sleep apnea through a sleep study? No  Do you snore loudly (loud enough to be heard through closed doors)?  1  Do you often feel tired, fatigued, or sleepy during the daytime? 0  Has anyone observed you stop breathing during your sleep? 0  Do you have, or are you being treated for high blood pressure? 1  BMI more than 35 kg/m2? 0  Age over 52 years old? 1  Neck circumference greater than 40 cm/18 inches? 0  Gender: 1  Obstructive Sleep Apnea Score 4  Score 4 or greater  Results sent to PCP

## 2013-05-13 NOTE — Progress Notes (Signed)
Received call from Dr. Blackman - OK for surgery 

## 2013-05-15 ENCOUNTER — Inpatient Hospital Stay (HOSPITAL_COMMUNITY): Payer: BC Managed Care – PPO

## 2013-05-15 ENCOUNTER — Inpatient Hospital Stay (HOSPITAL_COMMUNITY): Payer: BC Managed Care – PPO | Admitting: Anesthesiology

## 2013-05-15 ENCOUNTER — Inpatient Hospital Stay (HOSPITAL_COMMUNITY)
Admission: RE | Admit: 2013-05-15 | Discharge: 2013-05-19 | DRG: 469 | Disposition: A | Discharge status: Home Health | Payer: BC Managed Care – PPO | Source: Ambulatory Visit | Attending: Orthopaedic Surgery | Admitting: Orthopaedic Surgery

## 2013-05-15 ENCOUNTER — Encounter (HOSPITAL_COMMUNITY)
Admission: RE | Disposition: A | Discharge status: Home Health | Payer: Self-pay | Source: Ambulatory Visit | Attending: Orthopaedic Surgery

## 2013-05-15 ENCOUNTER — Encounter (HOSPITAL_COMMUNITY): Payer: Self-pay | Admitting: *Deleted

## 2013-05-15 ENCOUNTER — Encounter (HOSPITAL_COMMUNITY): Payer: BC Managed Care – PPO | Admitting: Anesthesiology

## 2013-05-15 DIAGNOSIS — J9819 Other pulmonary collapse: Secondary | ICD-10-CM | POA: Diagnosis not present

## 2013-05-15 DIAGNOSIS — I10 Essential (primary) hypertension: Secondary | ICD-10-CM

## 2013-05-15 DIAGNOSIS — R0902 Hypoxemia: Secondary | ICD-10-CM

## 2013-05-15 DIAGNOSIS — M169 Osteoarthritis of hip, unspecified: Secondary | ICD-10-CM

## 2013-05-15 DIAGNOSIS — Z823 Family history of stroke: Secondary | ICD-10-CM

## 2013-05-15 DIAGNOSIS — Z833 Family history of diabetes mellitus: Secondary | ICD-10-CM

## 2013-05-15 DIAGNOSIS — F17200 Nicotine dependence, unspecified, uncomplicated: Secondary | ICD-10-CM | POA: Diagnosis present

## 2013-05-15 DIAGNOSIS — D696 Thrombocytopenia, unspecified: Secondary | ICD-10-CM

## 2013-05-15 DIAGNOSIS — Z791 Long term (current) use of non-steroidal anti-inflammatories (NSAID): Secondary | ICD-10-CM

## 2013-05-15 DIAGNOSIS — M1610 Unilateral primary osteoarthritis, unspecified hip: Principal | ICD-10-CM | POA: Diagnosis present

## 2013-05-15 DIAGNOSIS — Z91199 Patient's noncompliance with other medical treatment and regimen due to unspecified reason: Secondary | ICD-10-CM

## 2013-05-15 DIAGNOSIS — Z9119 Patient's noncompliance with other medical treatment and regimen: Secondary | ICD-10-CM

## 2013-05-15 DIAGNOSIS — IMO0001 Reserved for inherently not codable concepts without codable children: Secondary | ICD-10-CM | POA: Diagnosis present

## 2013-05-15 DIAGNOSIS — J9600 Acute respiratory failure, unspecified whether with hypoxia or hypercapnia: Secondary | ICD-10-CM | POA: Diagnosis not present

## 2013-05-15 DIAGNOSIS — Z7982 Long term (current) use of aspirin: Secondary | ICD-10-CM

## 2013-05-15 DIAGNOSIS — I498 Other specified cardiac arrhythmias: Secondary | ICD-10-CM | POA: Diagnosis not present

## 2013-05-15 HISTORY — PX: TOTAL HIP ARTHROPLASTY: SHX124

## 2013-05-15 LAB — GLUCOSE, CAPILLARY

## 2013-05-15 LAB — TYPE AND SCREEN
ABO/RH(D): B POS
Antibody Screen: NEGATIVE

## 2013-05-15 SURGERY — ARTHROPLASTY, HIP, TOTAL, ANTERIOR APPROACH
Anesthesia: General | Site: Hip | Laterality: Right | Wound class: Clean

## 2013-05-15 MED ORDER — VITAMIN B-12 100 MCG PO TABS
100.0000 ug | ORAL_TABLET | Freq: Every day | ORAL | Status: DC
  Administered 2013-05-16 – 2013-05-19 (×4): 100 ug via ORAL
  Filled 2013-05-15 (×5): qty 1

## 2013-05-15 MED ORDER — GLIPIZIDE 5 MG PO TABS
5.0000 mg | ORAL_TABLET | Freq: Two times a day (BID) | ORAL | Status: DC
  Administered 2013-05-16 (×2): 5 mg via ORAL
  Filled 2013-05-15 (×5): qty 1

## 2013-05-15 MED ORDER — SODIUM CHLORIDE 0.9 % IV SOLN
INTRAVENOUS | Status: DC
  Administered 2013-05-16: 09:00:00 via INTRAVENOUS

## 2013-05-15 MED ORDER — ONDANSETRON HCL 4 MG PO TABS: 4.0000 mg | ORAL_TABLET | Freq: Four times a day (QID) | ORAL | Status: DC | PRN

## 2013-05-15 MED ORDER — ALUM & MAG HYDROXIDE-SIMETH 200-200-20 MG/5ML PO SUSP: 30.0000 mL | ORAL | Status: DC | PRN

## 2013-05-15 MED ORDER — METFORMIN HCL 500 MG PO TABS
1000.0000 mg | ORAL_TABLET | Freq: Two times a day (BID) | ORAL | Status: DC
  Administered 2013-05-16 (×2): 1000 mg via ORAL
  Filled 2013-05-15 (×5): qty 2

## 2013-05-15 MED ORDER — ADULT MULTIVITAMIN W/MINERALS CH
1.0000 | ORAL_TABLET | Freq: Every day | ORAL | Status: DC
  Administered 2013-05-16 – 2013-05-19 (×4): 1 via ORAL
  Filled 2013-05-15 (×4): qty 1

## 2013-05-15 MED ORDER — CEFAZOLIN SODIUM 1-5 GM-% IV SOLN
1.0000 g | Freq: Four times a day (QID) | INTRAVENOUS | Status: AC
  Administered 2013-05-15 – 2013-05-16 (×2): 1 g via INTRAVENOUS
  Filled 2013-05-15 (×2): qty 50

## 2013-05-15 MED ORDER — ZOLPIDEM TARTRATE 5 MG PO TABS: 5.0000 mg | ORAL_TABLET | Freq: Every evening | ORAL | Status: DC | PRN

## 2013-05-15 MED ORDER — CEFAZOLIN SODIUM-DEXTROSE 2-3 GM-% IV SOLR
INTRAVENOUS | Status: AC
  Filled 2013-05-15: qty 50

## 2013-05-15 MED ORDER — CEFAZOLIN SODIUM-DEXTROSE 2-3 GM-% IV SOLR
2.0000 g | INTRAVENOUS | Status: AC
  Administered 2013-05-15: 2 g via INTRAVENOUS

## 2013-05-15 MED ORDER — HYDROMORPHONE HCL PF 1 MG/ML IJ SOLN
0.2500 mg | INTRAMUSCULAR | Status: DC | PRN
  Administered 2013-05-15 (×4): 0.5 mg via INTRAVENOUS

## 2013-05-15 MED ORDER — PHENYLEPHRINE HCL 10 MG/ML IJ SOLN
INTRAMUSCULAR | Status: DC | PRN
  Administered 2013-05-15 (×3): 80 ug via INTRAVENOUS

## 2013-05-15 MED ORDER — METOCLOPRAMIDE HCL 10 MG PO TABS: 5.0000 mg | ORAL_TABLET | Freq: Three times a day (TID) | ORAL | Status: DC | PRN

## 2013-05-15 MED ORDER — EPHEDRINE SULFATE 50 MG/ML IJ SOLN
INTRAMUSCULAR | Status: DC | PRN
  Administered 2013-05-15: 10 mg via INTRAVENOUS

## 2013-05-15 MED ORDER — HYDROMORPHONE HCL PF 1 MG/ML IJ SOLN
INTRAMUSCULAR | Status: AC
  Administered 2013-05-16: 1 mg via INTRAVENOUS
  Filled 2013-05-15: qty 1

## 2013-05-15 MED ORDER — FENTANYL CITRATE 0.05 MG/ML IJ SOLN
INTRAMUSCULAR | Status: DC | PRN
  Administered 2013-05-15: 50 ug via INTRAVENOUS
  Administered 2013-05-15: 100 ug via INTRAVENOUS
  Administered 2013-05-15 (×2): 50 ug via INTRAVENOUS

## 2013-05-15 MED ORDER — ASPIRIN EC 325 MG PO TBEC
325.0000 mg | DELAYED_RELEASE_TABLET | Freq: Two times a day (BID) | ORAL | Status: DC
  Administered 2013-05-16 – 2013-05-19 (×7): 325 mg via ORAL
  Filled 2013-05-15 (×10): qty 1

## 2013-05-15 MED ORDER — ONDANSETRON HCL 4 MG/2ML IJ SOLN: 4.0000 mg | Freq: Four times a day (QID) | INTRAMUSCULAR | Status: DC | PRN

## 2013-05-15 MED ORDER — 0.9 % SODIUM CHLORIDE (POUR BTL) OPTIME
TOPICAL | Status: DC | PRN
  Administered 2013-05-15: 1000 mL

## 2013-05-15 MED ORDER — MENTHOL 3 MG MT LOZG
1.0000 | LOZENGE | OROMUCOSAL | Status: DC | PRN
  Filled 2013-05-15: qty 9

## 2013-05-15 MED ORDER — PROPOFOL 10 MG/ML IV BOLUS
INTRAVENOUS | Status: DC | PRN
  Administered 2013-05-15: 200 mg via INTRAVENOUS

## 2013-05-15 MED ORDER — ROCURONIUM BROMIDE 100 MG/10ML IV SOLN
INTRAVENOUS | Status: DC | PRN
  Administered 2013-05-15: 30 mg via INTRAVENOUS

## 2013-05-15 MED ORDER — OXYCODONE HCL 5 MG PO TABS
5.0000 mg | ORAL_TABLET | ORAL | Status: DC | PRN
  Administered 2013-05-15 – 2013-05-17 (×6): 10 mg via ORAL
  Filled 2013-05-15 (×6): qty 2

## 2013-05-15 MED ORDER — MEPERIDINE HCL 50 MG/ML IJ SOLN: 6.2500 mg | INTRAMUSCULAR | Status: DC | PRN

## 2013-05-15 MED ORDER — DOCUSATE SODIUM 100 MG PO CAPS
100.0000 mg | ORAL_CAPSULE | Freq: Two times a day (BID) | ORAL | Status: DC
  Administered 2013-05-15 – 2013-05-19 (×8): 100 mg via ORAL
  Filled 2013-05-15 (×5): qty 1

## 2013-05-15 MED ORDER — ACETAMINOPHEN 325 MG PO TABS
650.0000 mg | ORAL_TABLET | Freq: Four times a day (QID) | ORAL | Status: DC | PRN
  Administered 2013-05-16 (×2): 650 mg via ORAL
  Filled 2013-05-15 (×2): qty 2

## 2013-05-15 MED ORDER — OXYCODONE HCL ER 10 MG PO T12A
10.0000 mg | EXTENDED_RELEASE_TABLET | Freq: Two times a day (BID) | ORAL | Status: DC
  Administered 2013-05-15: 10 mg via ORAL
  Filled 2013-05-15: qty 1

## 2013-05-15 MED ORDER — ACETAMINOPHEN 10 MG/ML IV SOLN
1000.0000 mg | Freq: Once | INTRAVENOUS | Status: AC
  Administered 2013-05-15: 1000 mg via INTRAVENOUS
  Filled 2013-05-15: qty 100

## 2013-05-15 MED ORDER — CANAGLIFLOZIN 300 MG PO TABS
300.0000 mg | ORAL_TABLET | Freq: Every morning | ORAL | Status: DC
  Administered 2013-05-16 – 2013-05-17 (×2): 300 mg via ORAL
  Filled 2013-05-15 (×2): qty 1

## 2013-05-15 MED ORDER — POLYETHYLENE GLYCOL 3350 17 G PO PACK
17.0000 g | PACK | Freq: Every day | ORAL | Status: DC | PRN
  Filled 2013-05-15: qty 1

## 2013-05-15 MED ORDER — METHOCARBAMOL 100 MG/ML IJ SOLN
500.0000 mg | Freq: Four times a day (QID) | INTRAVENOUS | Status: DC | PRN
  Administered 2013-05-15 (×2): 500 mg via INTRAVENOUS
  Filled 2013-05-15 (×2): qty 5

## 2013-05-15 MED ORDER — DIPHENHYDRAMINE HCL 12.5 MG/5ML PO ELIX: 12.5000 mg | ORAL_SOLUTION | ORAL | Status: DC | PRN

## 2013-05-15 MED ORDER — GLYCOPYRROLATE 0.2 MG/ML IJ SOLN
INTRAMUSCULAR | Status: DC | PRN
  Administered 2013-05-15: .5 mg via INTRAVENOUS

## 2013-05-15 MED ORDER — LACTATED RINGERS IV SOLN
INTRAVENOUS | Status: DC
  Administered 2013-05-15: 16:00:00 via INTRAVENOUS
  Administered 2013-05-15: 1000 mL via INTRAVENOUS

## 2013-05-15 MED ORDER — METHOCARBAMOL 500 MG PO TABS
500.0000 mg | ORAL_TABLET | Freq: Four times a day (QID) | ORAL | Status: DC | PRN
  Administered 2013-05-16 – 2013-05-18 (×3): 500 mg via ORAL
  Filled 2013-05-15 (×3): qty 1

## 2013-05-15 MED ORDER — LOSARTAN POTASSIUM 50 MG PO TABS
100.0000 mg | ORAL_TABLET | Freq: Every morning | ORAL | Status: DC
  Administered 2013-05-16 – 2013-05-19 (×4): 100 mg via ORAL
  Filled 2013-05-15 (×4): qty 2

## 2013-05-15 MED ORDER — NEOSTIGMINE METHYLSULFATE 1 MG/ML IJ SOLN
INTRAMUSCULAR | Status: DC | PRN
  Administered 2013-05-15: 4 mg via INTRAVENOUS

## 2013-05-15 MED ORDER — MIDAZOLAM HCL 5 MG/5ML IJ SOLN
INTRAMUSCULAR | Status: DC | PRN
  Administered 2013-05-15: 2 mg via INTRAVENOUS

## 2013-05-15 MED ORDER — HYDROMORPHONE HCL PF 1 MG/ML IJ SOLN
1.0000 mg | INTRAMUSCULAR | Status: DC | PRN
Start: 2013-05-15 — End: 2013-05-19
  Administered 2013-05-15 – 2013-05-18 (×6): 1 mg via INTRAVENOUS
  Filled 2013-05-15 (×6): qty 1

## 2013-05-15 MED ORDER — METOCLOPRAMIDE HCL 5 MG/ML IJ SOLN: 5.0000 mg | Freq: Three times a day (TID) | INTRAMUSCULAR | Status: DC | PRN

## 2013-05-15 MED ORDER — PHENOL 1.4 % MT LIQD
1.0000 | OROMUCOSAL | Status: DC | PRN
  Filled 2013-05-15: qty 177

## 2013-05-15 MED ORDER — INSULIN ASPART 100 UNIT/ML ~~LOC~~ SOLN
0.0000 [IU] | Freq: Three times a day (TID) | SUBCUTANEOUS | Status: DC
  Administered 2013-05-16: 3 [IU] via SUBCUTANEOUS
  Administered 2013-05-16: 2 [IU] via SUBCUTANEOUS

## 2013-05-15 MED ORDER — ONDANSETRON HCL 4 MG/2ML IJ SOLN
INTRAMUSCULAR | Status: DC | PRN
  Administered 2013-05-15: 4 mg via INTRAVENOUS

## 2013-05-15 MED ORDER — PROMETHAZINE HCL 25 MG/ML IJ SOLN: 6.2500 mg | INTRAMUSCULAR | Status: DC | PRN

## 2013-05-15 MED ORDER — ACETAMINOPHEN 650 MG RE SUPP: 650.0000 mg | Freq: Four times a day (QID) | RECTAL | Status: DC | PRN

## 2013-05-15 MED ORDER — STERILE WATER FOR IRRIGATION IR SOLN
Status: DC | PRN
Start: 2013-05-15 — End: 2013-05-15
  Administered 2013-05-15: 3000 mL

## 2013-05-15 MED ORDER — LACTATED RINGERS IV SOLN: INTRAVENOUS | Status: DC

## 2013-05-15 MED ORDER — AMLODIPINE BESYLATE 5 MG PO TABS
5.0000 mg | ORAL_TABLET | Freq: Every morning | ORAL | Status: DC
  Administered 2013-05-16 – 2013-05-19 (×4): 5 mg via ORAL
  Filled 2013-05-15 (×4): qty 1

## 2013-05-15 MED ORDER — SUCCINYLCHOLINE CHLORIDE 20 MG/ML IJ SOLN
INTRAMUSCULAR | Status: DC | PRN
  Administered 2013-05-15: 100 mg via INTRAVENOUS

## 2013-05-15 MED ORDER — INSULIN ASPART 100 UNIT/ML ~~LOC~~ SOLN: 0.0000 [IU] | Freq: Every day | SUBCUTANEOUS | Status: DC

## 2013-05-15 MED ORDER — SODIUM CHLORIDE 0.9 % IR SOLN
Status: DC | PRN
  Administered 2013-05-15: 1000 mL

## 2013-05-15 SURGICAL SUPPLY — 41 items
ADH SKN CLS APL DERMABOND .7 (GAUZE/BANDAGES/DRESSINGS)
BAG SPEC THK2 15X12 ZIP CLS (MISCELLANEOUS)
BAG ZIPLOCK 12X15 (MISCELLANEOUS) IMPLANT
BLADE SAW SGTL 18X1.27X75 (BLADE) ×2 IMPLANT
CAPT HIP PF COP ×1 IMPLANT
CELLS DAT CNTRL 66122 CELL SVR (MISCELLANEOUS) ×1 IMPLANT
DERMABOND ADVANCED (GAUZE/BANDAGES/DRESSINGS)
DERMABOND ADVANCED .7 DNX12 (GAUZE/BANDAGES/DRESSINGS) IMPLANT
DRAPE C-ARM 42X120 X-RAY (DRAPES) ×2 IMPLANT
DRAPE STERI IOBAN 125X83 (DRAPES) ×2 IMPLANT
DRAPE U-SHAPE 47X51 STRL (DRAPES) ×6 IMPLANT
DRSG AQUACEL AG ADV 3.5X10 (GAUZE/BANDAGES/DRESSINGS) ×2 IMPLANT
DURAPREP 26ML APPLICATOR (WOUND CARE) ×2 IMPLANT
ELECT BLADE TIP CTD 4 INCH (ELECTRODE) ×2 IMPLANT
ELECT REM PT RETURN 9FT ADLT (ELECTROSURGICAL) ×2
ELECTRODE REM PT RTRN 9FT ADLT (ELECTROSURGICAL) ×1 IMPLANT
FACESHIELD LNG OPTICON STERILE (SAFETY) ×8 IMPLANT
GAUZE XEROFORM 5X9 LF (GAUZE/BANDAGES/DRESSINGS) ×2 IMPLANT
GLOVE BIO SURGEON STRL SZ7.5 (GLOVE) ×2 IMPLANT
GLOVE BIOGEL PI IND STRL 8 (GLOVE) ×2 IMPLANT
GLOVE BIOGEL PI INDICATOR 8 (GLOVE) ×2
GLOVE ECLIPSE 8.0 STRL XLNG CF (GLOVE) ×2 IMPLANT
GOWN STRL REIN XL XLG (GOWN DISPOSABLE) ×4 IMPLANT
HANDPIECE INTERPULSE COAX TIP (DISPOSABLE) ×2
KIT BASIN OR (CUSTOM PROCEDURE TRAY) ×2 IMPLANT
PACK TOTAL JOINT (CUSTOM PROCEDURE TRAY) ×2 IMPLANT
PADDING CAST COTTON 6X4 STRL (CAST SUPPLIES) ×2 IMPLANT
RETRACTOR WND ALEXIS 18 MED (MISCELLANEOUS) ×1 IMPLANT
RTRCTR WOUND ALEXIS 18CM MED (MISCELLANEOUS) ×2
SET HNDPC FAN SPRY TIP SCT (DISPOSABLE) ×1 IMPLANT
STAPLER VISISTAT 35W (STAPLE) ×2 IMPLANT
SUT ETHIBOND NAB CT1 #1 30IN (SUTURE) ×2 IMPLANT
SUT ETHILON 3 0 PS 1 (SUTURE) IMPLANT
SUT MNCRL AB 4-0 PS2 18 (SUTURE) IMPLANT
SUT VIC AB 0 CT1 36 (SUTURE) ×2 IMPLANT
SUT VIC AB 1 CT1 36 (SUTURE) ×2 IMPLANT
SUT VIC AB 2-0 CT1 27 (SUTURE) ×2
SUT VIC AB 2-0 CT1 TAPERPNT 27 (SUTURE) ×1 IMPLANT
TOWEL OR 17X26 10 PK STRL BLUE (TOWEL DISPOSABLE) ×2 IMPLANT
TOWEL OR NON WOVEN STRL DISP B (DISPOSABLE) ×2 IMPLANT
TRAY FOLEY CATH 14FRSI W/METER (CATHETERS) ×2 IMPLANT

## 2013-05-15 NOTE — Transfer of Care (Signed)
Immediate Anesthesia Transfer of Care Note  Patient: [name] [name]  Procedure(s) Performed: Procedure(s): RIGHT TOTAL HIP ARTHROPLASTY ANTERIOR APPROACH (Right)  Patient Location: PACU  Anesthesia Type:General  Level of Consciousness: sedated and patient cooperative  Airway & Oxygen Therapy: Patient Spontanous Breathing and Patient connected to face mask oxygen  Post-op Assessment: Report given to PACU RN and Post -op Vital signs reviewed and stable  Post vital signs: Reviewed and stable  Complications: No apparent anesthesia complications

## 2013-05-15 NOTE — Anesthesia Preprocedure Evaluation (Signed)
Anesthesia Evaluation  Patient identified by MRN, date of birth, ID band Patient awake    Reviewed: Allergy & Precautions, H&P , NPO status , Patient's Chart, lab work & pertinent test results  Airway Mallampati: II TM Distance: >3 FB Neck ROM: Full    Dental no notable dental hx.    Pulmonary neg pulmonary ROS, Current Smoker,  breath sounds clear to auscultation  Pulmonary exam normal       Cardiovascular hypertension, Pt. on medications Rhythm:Regular Rate:Normal     Neuro/Psych negative neurological ROS  negative psych ROS   GI/Hepatic negative GI ROS, Neg liver ROS,   Endo/Other  diabetes, Type 2, Oral Hypoglycemic Agents  Renal/GU negative Renal ROS  negative genitourinary   Musculoskeletal negative musculoskeletal ROS (+)   Abdominal   Peds negative pediatric ROS (+)  Hematology negative hematology ROS (+)   Anesthesia Other Findings   Reproductive/Obstetrics negative OB ROS                           Anesthesia Physical Anesthesia Plan  ASA: II  Anesthesia Plan: General   Post-op Pain Management:    Induction: Intravenous  Airway Management Planned: Oral ETT  Additional Equipment:   Intra-op Plan:   Post-operative Plan: Extubation in OR  Informed Consent: I have reviewed the patients History and Physical, chart, labs and discussed the procedure including the risks, benefits and alternatives for the proposed anesthesia with the patient or authorized representative who has indicated his/her understanding and acceptance.   Dental advisory given  Plan Discussed with: CRNA  Anesthesia Plan Comments:         Anesthesia Quick Evaluation

## 2013-05-15 NOTE — Brief Op Note (Signed)
05/15/2013  4:11 PM  PATIENT:  [name] [name]  52 y.o. male  PRE-OPERATIVE DIAGNOSIS:  Osteoarthritis right hip  POST-OPERATIVE DIAGNOSIS:  Osteoarthritis right hip  PROCEDURE:  Procedure(s): RIGHT TOTAL HIP ARTHROPLASTY ANTERIOR APPROACH (Right)  SURGEON:  Surgeon(s) and Role:    * Kathryne Hitch, MD - Primary  PHYSICIAN ASSISTANT: Rexene Edison, PA-C  ANESTHESIA:   general  EBL:  Total I/O In: 1000 [I.V.:1000] Out: 500 [Urine:400; Blood:100]  BLOOD ADMINISTERED:none  DRAINS: none   LOCAL MEDICATIONS USED:  NONE  SPECIMEN:  No Specimen  DISPOSITION OF SPECIMEN:  N/A  COUNTS:  YES  TOURNIQUET:  * No tourniquets in log *  DICTATION: .Other Dictation: Dictation Number [id number]  PLAN OF CARE: Admit to inpatient   PATIENT DISPOSITION:  PACU - hemodynamically stable.   Delay start of Pharmacological VTE agent (>24hrs) due to surgical blood loss or risk of bleeding: no

## 2013-05-15 NOTE — H&P (Signed)
TOTAL HIP ADMISSION H&P  Patient is admitted for right total hip arthroplasty.  Subjective:  Chief Complaint: right hip pain  HPI: [name] [name], 52 y.o. male, has a history of pain and functional disability in the right hip(s) due to arthritis and patient has failed non-surgical conservative treatments for greater than 12 weeks to include NSAID's and/or analgesics, corticosteriod injections, weight reduction as appropriate and activity modification.  Onset of symptoms was gradual starting 1 years ago with gradually worsening course since that time.The patient noted no past surgery on the right hip(s).  Patient currently rates pain in the right hip at 10 out of 10 with activity. Patient has night pain, worsening of pain with activity and weight bearing, trendelenberg gait, pain that interfers with activities of daily living and pain with passive range of motion. Patient has evidence of subchondral sclerosis, periarticular osteophytes and joint space narrowing by imaging studies. This condition presents safety issues increasing the risk of falls.  There is no current active infection.  Patient Active Problem List   Diagnosis Date Noted  . Degenerative arthritis of right hip 05/15/2013  . Inguinal strain 09/02/2012   Past Medical History  Diagnosis Date  . Hypertension   . Diabetes mellitus   . Arthritis     Past Surgical History  Procedure Laterality Date  . Clavicle surgery  2010  . Umbilical hernia repair  2009  . Joint replacement      metal plate in lft knee  . Ankle surgery  1996    L ankle    Prescriptions prior to admission  Medication Sig Dispense Refill  . amLODipine (NORVASC) 5 MG tablet Take 5 mg by mouth every morning.      Marland Kitchen aspirin EC 81 MG tablet Take 81 mg by mouth daily.      . Canagliflozin (INVOKANA) 300 MG TABS Take 300 mg by mouth every morning.      Marland Kitchen CINNAMON PO Take 1,000 mg by mouth 2 (two) times daily.      . fish oil-omega-3 fatty acids 1000 MG capsule  Take 1 g by mouth daily.      Marland Kitchen gabapentin (NEURONTIN) 300 MG capsule Take 300 mg by mouth at bedtime.      Marland Kitchen glipiZIDE (GLUCOTROL) 5 MG tablet Take 5 mg by mouth 2 (two) times daily before a meal.      . losartan (COZAAR) 100 MG tablet Take 100 mg by mouth every morning.       . metFORMIN (GLUCOPHAGE) 1000 MG tablet Take 1,000 mg by mouth 2 (two) times daily with a meal.      . Multiple Vitamins-Minerals (MULTIVITAMINS THER. W/MINERALS) TABS Take 1 tablet by mouth daily.       . vitamin B-12 (CYANOCOBALAMIN) 100 MCG tablet Take 100 mcg by mouth daily.        No Known Allergies  History  Substance Use Topics  . Smoking status: Current Some Day Smoker    Types: Cigars  . Smokeless tobacco: Not on file     Comment: a cigar occasionally  . Alcohol Use: Yes     Comment: beer - occasionally    Family History  Problem Relation Age of Onset  . Diabetes Mother   . Stroke Mother   . Leukemia Maternal Aunt      Review of Systems  Musculoskeletal: Positive for back pain and joint pain.  All other systems reviewed and are negative.    Objective:  Physical Exam  Constitutional:  He is oriented to person, place, and time. He appears well-developed and well-nourished.  HENT:  Head: Normocephalic and atraumatic.  Eyes: EOM are normal. Pupils are equal, round, and reactive to light.  Neck: Normal range of motion. Neck supple.  Cardiovascular: Normal rate and regular rhythm.   Respiratory: Effort normal and breath sounds normal.  GI: Soft. Bowel sounds are normal.  Musculoskeletal:       Right hip: He exhibits decreased range of motion, decreased strength and bony tenderness.  Neurological: He is alert and oriented to person, place, and time.  Skin: Skin is warm and dry.  Psychiatric: He has a normal mood and affect.    Vital signs in last 24 hours: Temp:  [98.2 F (36.8 C)] 98.2 F (36.8 C) (11/14 1158) Pulse Rate:  [90] 90 (11/14 1158) Resp:  [18] 18 (11/14 1158) BP: (152)/(108)  152/108 mmHg (11/14 1158) SpO2:  [100 %] 100 % (11/14 1158)  Labs:   Estimated body mass index is 31.96 kg/(m^2) as calculated from the following:   Height as of 05/11/13: 5' 10.5" (1.791 m).   Weight as of 09/02/12: 102.513 kg (226 lb).   Imaging Review Plain radiographs demonstrate moderate degenerative joint disease of the right hip(s). The bone quality appears to be excellent for age and reported activity level.  Assessment/Plan:  End stage arthritis, right hip(s)  The patient history, physical examination, clinical judgement of the provider and imaging studies are consistent with end stage degenerative joint disease of the right hip(s) and total hip arthroplasty is deemed medically necessary. The treatment options including medical management, injection therapy, arthroscopy and arthroplasty were discussed at length. The risks and benefits of total hip arthroplasty were presented and reviewed. The risks due to aseptic loosening, infection, stiffness, dislocation/subluxation,  thromboembolic complications and other imponderables were discussed.  The patient acknowledged the explanation, agreed to proceed with the plan and consent was signed. Patient is being admitted for inpatient treatment for surgery, pain control, PT, OT, prophylactic antibiotics, VTE prophylaxis, progressive ambulation and ADL's and discharge planning.The patient is planning to be discharged home with home health services

## 2013-05-15 NOTE — Anesthesia Postprocedure Evaluation (Signed)
  Anesthesia Post-op Note  Patient: [name] [name]  Procedure(s) Performed: Procedure(s) (LRB): RIGHT TOTAL HIP ARTHROPLASTY ANTERIOR APPROACH (Right)  Patient Location: PACU  Anesthesia Type: General  Level of Consciousness: awake and alert   Airway and Oxygen Therapy: Patient Spontanous Breathing  Post-op Pain: mild  Post-op Assessment: Post-op Vital signs reviewed, Patient's Cardiovascular Status Stable, Respiratory Function Stable, Patent Airway and No signs of Nausea or vomiting  Last Vitals:  Filed Vitals:   05/15/13 1158  BP: 152/108  Pulse: 90  Temp: 36.8 C  Resp: 18    Post-op Vital Signs: stable   Complications: No apparent anesthesia complications

## 2013-05-15 NOTE — Preoperative (Signed)
Beta Blockers   Reason not to administer Beta Blockers:Not Applicable 

## 2013-05-16 LAB — HEMOGLOBIN A1C
Hgb A1c MFr Bld: 10.5 % — ABNORMAL HIGH (ref <5.7)
Mean Plasma Glucose: 255 mg/dL — ABNORMAL HIGH (ref <117)

## 2013-05-16 LAB — GLUCOSE, CAPILLARY
Glucose-Capillary: 184 mg/dL — ABNORMAL HIGH (ref 70–99)
Glucose-Capillary: 194 mg/dL — ABNORMAL HIGH (ref 70–99)
Glucose-Capillary: 139 mg/dL — ABNORMAL HIGH (ref 70–99)

## 2013-05-16 LAB — BASIC METABOLIC PANEL
CO2: 22 mEq/L (ref 19–32)
Calcium: 8.8 mg/dL (ref 8.4–10.5)
Chloride: 99 mEq/L (ref 96–112)
GFR calc non Af Amer: >90 mL/min (ref >90)
Sodium: 133 mEq/L — ABNORMAL LOW (ref 135–145)

## 2013-05-16 LAB — CBC
MCH: 28.6 pg (ref 26.0–34.0)
MCHC: 33.3 g/dL (ref 30.0–36.0)
Platelets: 147 10*3/uL — ABNORMAL LOW (ref 150–400)
RBC: 4.44 MIL/uL (ref 4.22–5.81)
WBC: 8.1 10*3/uL (ref 4.0–10.5)

## 2013-05-16 MED ORDER — SODIUM CHLORIDE 0.9 % IV BOLUS (SEPSIS): 500.0000 mL | Freq: Once | INTRAVENOUS | Status: DC

## 2013-05-16 MED ORDER — LACTATED RINGERS IV BOLUS (SEPSIS)
500.0000 mL | Freq: Once | INTRAVENOUS | Status: AC
  Administered 2013-05-16: 500 mL via INTRAVENOUS

## 2013-05-16 MED ORDER — OXYCODONE HCL ER 20 MG PO T12A
20.0000 mg | EXTENDED_RELEASE_TABLET | Freq: Two times a day (BID) | ORAL | Status: DC
  Administered 2013-05-16 (×2): 20 mg via ORAL
  Filled 2013-05-16 (×2): qty 1

## 2013-05-16 NOTE — Progress Notes (Signed)
   CARE MANAGEMENT NOTE 05/16/2013  Patient:  DAMANTE, SPRAGG   Account Number:  [id number]  Date Initiated:  05/16/2013  Documentation initiated by:  [facility]  Subjective/Objective Assessment:   RIGHT TOTAL HIP ARTHROPLASTY ANTERIOR APPROACH (Right)     Action/Plan:   lives at home with wife   Anticipated DC Date:  05/17/2013   Anticipated DC Plan:  HOME W HOME HEALTH SERVICES      DC Planning Services  CM consult      Choice offered to / List presented to:          [facility] arranged  HH-2 PT      [facility] agency  [facility]   Status of service:  Completed, signed off Medicare Important Message given?   (If response is "NO", the following Medicare IM given date fields will be blank) Date Medicare IM given:   Date Additional Medicare IM given:    Discharge Disposition:  HOME W HOME HEALTH SERVICES  Per UR Regulation:    If discussed at Long Length of Stay Meetings, dates discussed:    Comments:  05/16/2013 1145 NCM spoke to wife. Pt was preoperatively set up with [facility] for [facility]. Wife states pt has no DME at home. Will order DME at time of dc with [facility].  Isidoro Donning RN CCM Case Mgmt phone [phone]

## 2013-05-16 NOTE — Evaluation (Signed)
Physical Therapy Evaluation Patient Details Name: [name] [name] MRN: [id number] DOB: Mar 05, 1961 Today's Date: 05/16/2013 Time: 2725-3664 PT Time Calculation (min): 39 min  PT Assessment / Plan / Recommendation History of Present Illness  pt s/p direct anterior THA RLE  Clinical Impression  Pt with R anterior THA presents with extreme pain in R thigh, lateral quad area and tolerating very little movement during session today. Also noted to have trace quad and hip flexor strength today, as well as being very lethargic during session. Alerted nursing of this and monitoring fluctuation of HR as well, seemed at times the pulse ox would alarm and not have the ability to tract heart rate at times. Nursing aware of this as well. Will continue to follow him and need of PT to increase ability to mobilize and return to home with spouse. May need to stay on ground level for a while before heading up full flight to bedroom.      PT Assessment  Patient needs continued PT services    Follow Up Recommendations  Home health PT    Does the patient have the potential to tolerate intense rehabilitation      Barriers to Discharge        Equipment Recommendations  Rolling walker with 5" wheels    Recommendations for Other Services     Frequency 7X/week    Precautions / Restrictions Precautions Precautions: None Precaution Comments: direct anterior Restrictions Weight Bearing Restrictions: No Other Position/Activity Restrictions: WBAT   Pertinent Vitals/Pain Great pain.. 10/10  with very little movement and even to light palpation on R thigh area. nurisng aware, position in recliner and pt resting after session and ice applied to this area.    Mobility  Bed Mobility Bed Mobility: Not assessed (pt up in recliner , did not want to go back to bed, stated was in bed last night and it was hurting his thigh too much . He got out of bed at 3:00a.m.) Transfers Transfers: Sit to Stand;Stand to  Sit Sit to Stand: 1: +2 Total assist;With armrests;With upper extremity assist Sit to Stand: Patient Percentage: 60% Stand to Sit: 1: +2 Total assist;With upper extremity assist;With armrests Stand to Sit: Patient Percentage: 60% Details for Transfer Assistance: pt had to utilize all upper body strength to rise and sit down for the R LE was in so much paina dn unable to move the RLE in any fashion. one person had to assist with the the entire time.  Ambulation/Gait Ambulation/Gait Assistance: Not tested (comment) Ambulation/Gait Assistance Details: took 1-2 steps but with total assistance to progreess R LE and felt no quad contrction during this time. Worry of buckling, may use KI for next session .     Exercises Total Joint Exercises Ankle Circles/Pumps: AROM;Both;10 reps Short Arc Quad: PROM;Right;5 reps (very slow to help with movment and pain however very difficultt for him to tolerate any quad stretch. )   PT Diagnosis: Difficulty walking  PT Problem List: Decreased strength;Decreased range of motion;Decreased activity tolerance;Decreased mobility;Decreased knowledge of use of DME PT Treatment Interventions: Gait training;DME instruction;Stair training;Functional mobility training;Therapeutic activities;Therapeutic exercise     PT Goals(Current goals can be found in the care plan section) Acute Rehab PT Goals Patient Stated Goal: to go home PT Goal Formulation: With patient Time For Goal Achievement: 05/30/13 Potential to Achieve Goals: Good  Visit Information  Last PT Received On: 05/16/13 Assistance Needed: +2 PT/OT Co-Evaluation/Treatment: Yes (after beginning sessionw tih pt , knew I needed 2 skilled  therapists for difficulties pt was having. ) History of Present Illness: pt s/p direct anterior THA RLE       Prior Functioning  Home Living Family/patient expects to be discharged to:: Private residence Living Arrangements: Spouse/significant other Available Help at  Discharge: Family;Available 24 hours/day Type of Home: House Home Access: Stairs to enter [employer] of Steps: 1+1 Entrance Stairs-Rails: None Home Layout: Two level;1/2 bath on main level;Other (Comment) (bedrooms upstairs but 1/2 abth on ground level) Alternate Level Stairs-Number of Steps: 4 +14 Alternate Level Stairs-Rails: Right Home Equipment: None Prior Function Level of Independence: Independent Communication Communication: No difficulties    Cognition  Cognition Arousal/Alertness: Lethargic;Suspect due to medications Behavior During Therapy: Impulsive Overall Cognitive Status: Impaired/Different from baseline Area of Impairment: Safety/judgement Safety/Judgement: Decreased awareness of safety General Comments: suspect due to medications    Extremity/Trunk Assessment Upper Extremity Assessment Upper Extremity Assessment: Overall WFL for tasks assessed Lower Extremity Assessment Lower Extremity Assessment: RLE deficits/detail RLE Deficits / Details: active DF , but 1/4 quad and hip flxor activity.Marland Kitchen and very painful to attemp contraction as well  RLE: Unable to fully assess due to pain RLE Sensation:  (sensation intact per patient and assessment)   Balance    End of Session PT - End of Session Equipment Utilized During Treatment: Gait belt Activity Tolerance: Patient limited by lethargy;Patient limited by pain Patient left: in chair;with family/visitor present Nurse Communication: Mobility status  GP     Marella Bile 05/16/2013, 2:26 PM Marella Bile, PT Pager: [phone] 05/16/2013

## 2013-05-16 NOTE — Progress Notes (Signed)
Occupational Therapy Evaluation Patient Details Name: [name] [name] MRN: [id number] DOB: 1961-04-04 Today's Date: 05/16/2013 Time: 9562-1308 OT Time Calculation (min): 26 min  OT Assessment / Plan / Recommendation History of present illness Patient s/p R THR   Clinical Impression   Patient with decreased arousal/alertness, suspect due to medication. Also with decreased R quad strength impacting safe mobility on eval.    OT Assessment  Patient needs continued OT Services    Follow Up Recommendations  Supervision/Assistance - 24 hour;No OT follow up    Barriers to Discharge      Equipment Recommendations  3 in 1 bedside comode    Recommendations for Other Services    Frequency  Min 2X/week    Precautions / Restrictions Restrictions Weight Bearing Restrictions: No Other Position/Activity Restrictions: WBAT   Pertinent Vitals/Pain C/o pain R leg with movement    ADL  Upper Body Bathing: +1 Total assistance Lower Body Bathing: +1 Total assistance Upper Body Dressing: +1 Total assistance Lower Body Dressing: +1 Total assistance Toilet Transfer: Simulated;+2 Total assistance Toilet Transfer: Patient Percentage: 50% Toilet Transfer Method: Sit to stand Transfers/Ambulation Related to ADLs: sit to stand x 2 from recliner with mod A of 2. Patient with difficulty with quad on R not supporting him. Also has had a lot of pain medication with resulting lethargy/decreased arousal/alertness. Mildly impulsive. ADL Comments: Limited by pain, decreased alertness/arousal, weak quad on R    OT Diagnosis: Generalized weakness;Acute pain  OT Problem List: Decreased strength;Decreased range of motion;Decreased activity tolerance;Decreased knowledge of use of DME or AE;Pain OT Treatment Interventions: Self-care/ADL training;DME and/or AE instruction;Therapeutic activities;Patient/family education   OT Goals(Current goals can be found in the care plan section) Acute Rehab OT Goals Patient  Stated Goal: to go home OT Goal Formulation: With patient Time For Goal Achievement: 05/30/13 Potential to Achieve Goals: Good  Visit Information  Last OT Received On: 05/16/13 Assistance Needed: +2 PT/OT Co-Evaluation/Treatment: Yes History of Present Illness: Patient s/p R THR       Prior Functioning     Home Living Family/patient expects to be discharged to:: Private residence Living Arrangements: Spouse/significant other Available Help at Discharge: Family;Available 24 hours/day (wife available to assist initially) Type of Home: House Home Access: Stairs to enter [employer] of Steps: 1 Home Layout: Two level;1/2 bath on main level;Other (Comment) (all bedrooms upstairs) Prior Function Level of Independence: Independent         Vision/Perception Vision - History Baseline Vision: Wears glasses all the time   Cognition  Cognition Arousal/Alertness: Lethargic;Suspect due to medications Behavior During Therapy: Impulsive (mildly) Overall Cognitive Status: Impaired/Different from baseline Area of Impairment: Safety/judgement Safety/Judgement: Decreased awareness of safety General Comments: suspect due to medications    Extremity/Trunk Assessment Upper Extremity Assessment Upper Extremity Assessment: Overall WFL for tasks assessed     Mobility       Exercise     Balance     End of Session OT - End of Session Equipment Utilized During Treatment: Gait belt;Rolling walker Activity Tolerance: Patient limited by pain;Patient limited by lethargy Patient left: in chair;with call bell/phone within reach;with family/visitor present Nurse Communication: Mobility status  GO     Liba Hulsey A 05/16/2013, 1:15 PM

## 2013-05-16 NOTE — Op Note (Signed)
NAMEALEXX, MCBURNEY               ACCOUNT NO.:  [id number]  MEDICAL RECORD NO.:  [id number]  LOCATION:  1602                         FACILITY:  [facility]  PHYSICIAN:  Vanita Panda. Magnus Ivan, M.D.DATE OF BIRTH:  June 11, 1961  DATE OF PROCEDURE:  05/15/2013 DATE OF DISCHARGE:                              OPERATIVE REPORT   PREOPERATIVE DIAGNOSES:  Moderate-to-severe arthritis and degenerative joint disease, right hip.  POSTOPERATIVE DIAGNOSES:  Moderate-to-severe arthritis and degenerative joint disease, right hip.  PROCEDURE:  Right total hip arthroplasty, direct anterior approach.  SURGEON:  Vanita Panda. Magnus Ivan, M.D.  ASSISTANT:  Richardean Canal, P.A.  ANESTHESIA:  General.  BLOOD LOSS:  100 mL.  ANTIBIOTICS:  2 g of IV Ancef.  IMPLANTS:  DePuy Sector Gription acetabular component size 54, size 36+ 4 neutral polyethylene liner, size 13 Corail femoral component with standard offset, size 36+ 1.5 ceramic hip ball.  COMPLICATIONS:  None.  INDICATIONS:  Mr. Tumolo is a 52 year old gentleman well-known to me. He has been having hip pain for over a year now of his right hip and in the groin, but plain films reviewed did not show severe arthritis at all.  We even had him seen for his back and all indications pointed towards hip as source of pain and an intra-articular injection actually helped him feel better.  An MRI was obtained of his head, that actually showed moderate arthritis of the hip with infusion and pain.  This has become debilitating for him.  This is at a point where he wishes to proceed with surgical intervention because his activities of daily living are limited, his mobility has become evident, his pain is daily. At this point, we recommended total hip arthroplasty using direct anterior approach.  He understands the risks and benefits of this and does wish to proceed.  PROCEDURE DESCRIPTION:  After informed consent was obtained, appropriate right hip was  marked.  He was brought to the operating room and general anesthesia obtained while he was on the stretcher.  Foley catheter was placed and both feet had traction boots applied.  Next, he was placed supine on the HANA fracture table with the perineal post in place and both legs in inline skeletal traction, but no traction applied.  His right operative hip was then prepped and draped with DuraPrep and sterile drapes.  Time-out was called and he was identified as the correct patient, correct right hip.  I then made an incision just inferior and posterior to the anterior superior iliac spine and carried this obliquely down the leg.  I dissected down to the tensor fascia lata muscle and tensor fascia was divided longitudinally.  I then proceeded with direct anterior approach to the hip.  Cobra retractor was placed around the lateral neck and up underneath the rectus femoris.  A Cobra retractor was placed medially.  I cauterized the circumflex vessels and then I opened up the hip capsule in a L-type format placing the hip retractors in the hip capsule.  We did find a significant effusion, I made my femoral neck cut with an oscillating saw just proximal to the lesser trochanter and completed this with an osteotome, removed the femoral head in its  entirety and found it to have a large area devoid of cartilage.  We then placed a Bent Hohmann along the medial acetabular rim and a Cobra retractor laterally.  I removed the remnants of the labrum and soft tissue from within the fovea.  I then began reaming from size 42 reamer in 2 mm increments up to a size 54.  All reamers were placed under direct visualization.  The last reamer was placed under direct fluoroscopy so we could get into the depth of reamer inclination and anteversion.  Once I was pleased with this, I put the real DePuy Sector Gription acetabular component size 54, an apex hole eliminator guide, and the size 36+ 4 neutral polyethylene  liner for a size 54 cup. Attention was then turned to the femur with the leg externally rotated in 100 degrees, extended and adducted.  I placed a Mueller retractor medially and Hohmann retractor behind the greater trochanter.  I released the lateral joint capsule and used a box cutting osteotome to enter the femoral canal and a rongeur to lateralize the broaching from a size 8 broach using the Corail broaching system from DePuy up to a size 13, 13 was felt to be nice and stable, so we trialed a standard neck and a 36+ 1.5 hip ball.  We brought the leg back over and up and with traction and internal rotation reduced into the pelvis, it was stable with internal and external rotation with minimal shuck, and his leg lengths were measured equal as well as his offset under direct fluoroscopy.  We then dislocated the hip and removed all trial components.  I placed the real Corail femoral component with standard offset, size 13, real 36+ 1.5 ceramic hip ball.  We reduced this back in the acetabulum.  It was nice and stable.  We then copiously irrigated the soft tissues with normal saline solution.  We closed the joint capsule with interrupted #1 Ethibond suture followed by running #1 Vicryl in the tensor fascia, 0-Vicryl in the deep tissue, 2-0 Vicryl in the subcutaneous tissue, and staples on the skin.  Well-padded sterile dressing was applied and he was taken off the HANA table, awakened, extubated, and taken to the recovery room in stable condition.  All final counts were correct and there were no complications noted.     Vanita Panda. Magnus Ivan, M.D.     CYB/MEDQ  D:  05/15/2013  T:  05/16/2013  Job:  [id number]

## 2013-05-16 NOTE — Plan of Care (Signed)
Problem: Consults Goal: Diagnosis- Total Joint Replacement Hemiarthroplasty     

## 2013-05-16 NOTE — Progress Notes (Signed)
Patient ID: [name] [name], male   DOB: 01-01-1961, 52 y.o.   MRN: [id number] Nursing, Selena Batten called to inform of elevated heart rate, EKG showed tachycardia HR 115-120. I recommended bolus with 500cc of LR, patient does not have diabetes. Also should continue with pain medications. Check H/H and BMET.

## 2013-05-16 NOTE — Progress Notes (Signed)
Patient ID: [name] [name], male   DOB: [birth year], 52 y.o.   MRN: [id number] Subjective: 1 Day Post-Op Procedure(s) (LRB): RIGHT TOTAL HIP ARTHROPLASTY ANTERIOR APPROACH (Right) Awake, alert and oriented x4.  8 of 10 pain, 10-15 last night. Patient reports pain as marked.    Objective:   VITALS:  Temp:  [97.6 F (36.4 C)-99.9 F (37.7 C)] 99.9 F (37.7 C) (11/15 0605) Pulse Rate:  [68-107] 100 (11/15 0605) Resp:  [11-24] 16 (11/15 0605) BP: (118-152)/(72-108) 123/82 mmHg (11/15 0605) SpO2:  [93 %-100 %] 98 % (11/15 0605) Weight:  [100.245 kg (221 lb)] 100.245 kg (221 lb) (11/14 1816)  Neurologically intact ABD soft Neurovascular intact Sensation intact distally Dorsiflexion/Plantar flexion intact Incision: dressing C/D/I   LABS  Recent Labs  05/16/13 0420  HGB 12.7*  WBC 8.1  PLT 147*    Recent Labs  05/16/13 0420  NA 133*  K 4.0  CL 99  CO2 22  BUN 10  CREATININE 0.96  GLUCOSE 199*   No results found for this basename: LABPT, INR,  in the last 72 hours   Assessment/Plan: 1 Day Post-Op Procedure(s) (LRB): RIGHT TOTAL HIP ARTHROPLASTY ANTERIOR APPROACH (Right)  Advance diet Up with therapy D/C IV fluids Foley to SD.   NITKA,JAMES E 05/16/2013, 8:37 AM

## 2013-05-17 ENCOUNTER — Inpatient Hospital Stay (HOSPITAL_COMMUNITY): Payer: BC Managed Care – PPO

## 2013-05-17 DIAGNOSIS — I10 Essential (primary) hypertension: Secondary | ICD-10-CM

## 2013-05-17 DIAGNOSIS — R0902 Hypoxemia: Secondary | ICD-10-CM

## 2013-05-17 DIAGNOSIS — M169 Osteoarthritis of hip, unspecified: Secondary | ICD-10-CM

## 2013-05-17 DIAGNOSIS — D696 Thrombocytopenia, unspecified: Secondary | ICD-10-CM

## 2013-05-17 DIAGNOSIS — IMO0001 Reserved for inherently not codable concepts without codable children: Secondary | ICD-10-CM

## 2013-05-17 LAB — URINALYSIS W MICROSCOPIC + REFLEX CULTURE
Bilirubin Urine: NEGATIVE
Glucose, UA: >1000 mg/dL — AB
Ketones, ur: 40 mg/dL — AB
Nitrite: NEGATIVE
Protein, ur: NEGATIVE mg/dL
Specific Gravity, Urine: 1.038 — ABNORMAL HIGH (ref 1.005–1.030)
Urobilinogen, UA: 0.2 mg/dL (ref 0.0–1.0)

## 2013-05-17 LAB — GLUCOSE, CAPILLARY
Glucose-Capillary: 89 mg/dL (ref 70–99)
Glucose-Capillary: 100 mg/dL — ABNORMAL HIGH (ref 70–99)
Glucose-Capillary: 107 mg/dL — ABNORMAL HIGH (ref 70–99)

## 2013-05-17 LAB — CBC
Hemoglobin: 11.5 g/dL — ABNORMAL LOW (ref 13.0–17.0)
MCH: 28.5 pg (ref 26.0–34.0)
MCHC: 33 g/dL (ref 30.0–36.0)
MCV: 86.4 fL (ref 78.0–100.0)
RBC: 4.04 MIL/uL — ABNORMAL LOW (ref 4.22–5.81)
RDW: 12.6 % (ref 11.5–15.5)

## 2013-05-17 LAB — BLOOD GAS, ARTERIAL
Acid-base deficit: 3.1 mmol/L — ABNORMAL HIGH (ref 0.0–2.0)
O2 Content: 3 L/min
Patient temperature: 37
TCO2: 19.7 mmol/L (ref 0–100)

## 2013-05-17 LAB — COMPREHENSIVE METABOLIC PANEL
ALT: 39 U/L (ref 0–53)
Albumin: 3.3 g/dL — ABNORMAL LOW (ref 3.5–5.2)
CO2: 22 mEq/L (ref 19–32)
Calcium: 9.4 mg/dL (ref 8.4–10.5)
Chloride: 97 mEq/L (ref 96–112)
Creatinine, Ser: 1.08 mg/dL (ref 0.50–1.35)
GFR calc Af Amer: 89 mL/min — ABNORMAL LOW (ref >90)
GFR calc non Af Amer: 77 mL/min — ABNORMAL LOW (ref >90)
Glucose, Bld: 105 mg/dL — ABNORMAL HIGH (ref 70–99)
Total Bilirubin: 0.8 mg/dL (ref 0.3–1.2)

## 2013-05-17 MED ORDER — SODIUM CHLORIDE 0.9 % IV SOLN
INTRAVENOUS | Status: DC
  Administered 2013-05-17 – 2013-05-19 (×4): via INTRAVENOUS

## 2013-05-17 MED ORDER — HYDROCODONE-ACETAMINOPHEN 7.5-325 MG PO TABS
1.0000 | ORAL_TABLET | ORAL | Status: DC | PRN
  Administered 2013-05-17 – 2013-05-19 (×4): 2 via ORAL
  Filled 2013-05-17 (×4): qty 2

## 2013-05-17 MED ORDER — IOHEXOL 350 MG/ML SOLN
100.0000 mL | Freq: Once | INTRAVENOUS | Status: AC | PRN
  Administered 2013-05-17: 100 mL via INTRAVENOUS

## 2013-05-17 MED ORDER — INSULIN ASPART 100 UNIT/ML ~~LOC~~ SOLN
0.0000 [IU] | Freq: Three times a day (TID) | SUBCUTANEOUS | Status: DC
  Administered 2013-05-18: 2 [IU] via SUBCUTANEOUS
  Administered 2013-05-18: 3 [IU] via SUBCUTANEOUS

## 2013-05-17 MED ORDER — INSULIN ASPART 100 UNIT/ML ~~LOC~~ SOLN: 0.0000 [IU] | Freq: Every day | SUBCUTANEOUS | Status: DC

## 2013-05-17 MED ORDER — BISACODYL 10 MG RE SUPP
10.0000 mg | Freq: Once | RECTAL | Status: AC
  Administered 2013-05-17: 10 mg via RECTAL
  Filled 2013-05-17: qty 1

## 2013-05-17 NOTE — Progress Notes (Signed)
Pt transported via bed to 1403 for tele monitoring.

## 2013-05-17 NOTE — Progress Notes (Signed)
Pt being transferred to tele bed per Dr. Arbutus Leas.  Report called to Morrie Sheldon, RN on 4 east.

## 2013-05-17 NOTE — Progress Notes (Signed)
Patient ID: [name] [name], male   DOB: [birth year], 52 y.o.   MRN: [id number] Subjective: 2 Days Post-Op Procedure(s) (LRB): RIGHT TOTAL HIP ARTHROPLASTY ANTERIOR APPROACH (Right) Awake, alert and oriented x 4. Mild pain today. PT notes drowsiness and we will stop Oxycontin and Oxycodone And start hydrocodone. Patient reports pain as moderate.    Objective:   VITALS:  Temp:  [99.7 F (37.6 C)-100.9 F (38.3 C)] 99.7 F (37.6 C) (11/16 0513) Pulse Rate:  [112-220] 112 (11/16 0513) Resp:  [16] 16 (11/16 0800) BP: (118-135)/(74-80) 123/74 mmHg (11/16 0513) SpO2:  [91 %-100 %] 100 % (11/16 0800)  Neurologically intact ABD soft Neurovascular intact Sensation intact distally Intact pulses distally Dorsiflexion/Plantar flexion intact Incision: dressing C/D/I and no drainage No cellulitis present   LABS  Recent Labs  05/16/13 0420 05/16/13 2202 05/17/13 0420  HGB 12.7* 11.7* 11.5*  WBC 8.1  --  9.4  PLT 147*  --  126*    Recent Labs  05/16/13 0420  NA 133*  K 4.0  CL 99  CO2 22  BUN 10  CREATININE 0.96  GLUCOSE 199*   No results found for this basename: LABPT, INR,  in the last 72 hours   Assessment/Plan: 2 Days Post-Op Procedure(s) (LRB): RIGHT TOTAL HIP ARTHROPLASTY ANTERIOR APPROACH (Right)  Advance diet Up with therapy D/C IV fluids Plan for discharge tomorrow Discharge home with home health Change back to a oral antihyperglycemic medication schedule and d/c SSI.  Margy Sumler E 05/17/2013, 11:13 AM

## 2013-05-17 NOTE — Progress Notes (Signed)
Pt taken to CT as he arrived to the floor, HR is 115 on telemetry monitor.  Dr Tat at bedside and speaking w/pts wife.

## 2013-05-17 NOTE — Progress Notes (Addendum)
Patient ID: [name] [name], male   DOB: 05-Nov-1960, 52 y.o.   MRN: [id number] Nursing called, noted recurring episodes of tachycardia as high as >120. He is alert. Pain appears controlled. EKG yesterday was consistent with tachycardia. His blood pressure is elevated 150/80s. He is presently on O2 pnc and when this is removed he tends to desat into the 80% Range.  STAT PCXR and New EKG ordered. [facility] hospitalist contacted and will see.

## 2013-05-17 NOTE — Progress Notes (Signed)
Occupational Therapy Treatment Patient Details Name: [name] [name] MRN: [id number] DOB: 1960/09/04 Today's Date: 05/17/2013 Time: 5621-3086 OT Time Calculation (min): 10 min  OT Assessment / Plan / Recommendation  History of present illness pt s/p direct anterior THA RLE   OT comments  Patient a little more alert, falling asleep during conversation/education. Resting HR 118-122.  Follow Up Recommendations  Supervision/Assistance - 24 hour;No OT follow up (? SNF if pt does not progress)    Barriers to Discharge       Equipment Recommendations  3 in 1 bedside comode    Recommendations for Other Services    Frequency Min 2X/week   Progress towards OT Goals Progress towards OT goals: Progressing toward goals  Plan      Precautions / Restrictions Precautions Precautions: None Precaution Comments: direct anterior Restrictions Weight Bearing Restrictions: No Other Position/Activity Restrictions: WBAT   Pertinent Vitals/Pain C/o 3/10 pain in RLE    ADL  Eating/Feeding: Performed;Set up Where Assessed - Eating/Feeding: Bed level Lower Body Bathing: Moderate assistance Where Assessed - Lower Body Bathing: Supine, head of bed up;Other (comment) (educated on long sponge vs assistance by wife.) Lower Body Dressing: Maximal assistance Where Assessed - Lower Body Dressing:  (educated on AE/techniques for LB dressing) Toilet Transfer:  (deferred due to HR 118-122 at rest) Transfers/Ambulation Related to ADLs: Patient in bed. Resting HR 118-122. Transfers/amb deferred. Patient to receive bolus per nurse.  ADL Comments: Patient a little bit more alert but falling asleep during conversation. Discussed LB self-care and AE (where to purchase). Patient states his wife works so he will not have 24/7 assistance.     OT Diagnosis:    OT Problem List:   OT Treatment Interventions:     OT Goals(current goals can now be found in the care plan section)    Visit Information  Last OT  Received On: 05/17/13 History of Present Illness: pt s/p direct anterior THA RLE    Subjective Data      Prior Functioning       Cognition  Cognition Arousal/Alertness: Lethargic;Suspect due to medications General Comments: falling asleep during conversation    Mobility       Exercises      Balance     End of Session OT - End of Session Activity Tolerance: Patient limited by lethargy Patient left: in bed;with call bell/phone within reach  GO     Daved Mcfann A 05/17/2013, 10:17 AM

## 2013-05-17 NOTE — Progress Notes (Signed)
Physical Therapy Treatment Patient Details Name: [name] [name] MRN: [id number] DOB: 01-24-1961 Today's Date: 05/17/2013 Time: 2440-1027 PT Time Calculation (min): 33 min  PT Assessment / Plan / Recommendation  History of Present Illness pt s/p direct anterior THA RLE   PT Comments   Pt more alert today however still with very sensitive point tenderness to touch (hot as well) with noticeable swelling in R mid thigh and lateral with numbness to right lateral thigh as well. Still trace quad activating in medial component, but not noticeable quad activation in rectus femoris region or hip flexor. Does have good hamstring which compensates with heel slides for lack of quad and hip flexors at this time. He was able to tolerate movement of this LE more today. Also noted slightly more stomach distension and pt reported to feeling hungry and like his pills were just not going down. All of this was communicated to nurse and MD on call (which happened to be Dr. Ophelia Charter at the time).  With gait, instructed pt to keep RLE out front to decrease weight bearing on it to prevent buckling, relying on step to pattern with BLE body strength. PT had to total assist R LE for progression and placement with each step of gait.    Follow Up Recommendations  Home health PT (however will see how he progresses at this time)     Does the patient have the potential to tolerate intense rehabilitation     Barriers to Discharge        Equipment Recommendations  Rolling walker with 5" wheels    Recommendations for Other Services    Frequency 7X/week   Progress towards PT Goals Progress towards PT goals: Progressing toward goals  Plan Current plan remains appropriate    Precautions / Restrictions Precautions Precautions: None Precaution Comments: direct anterior Restrictions Weight Bearing Restrictions: No Other Position/Activity Restrictions: WBAT   Pertinent Vitals/Pain See vitals entered... 157/91, 2 L O2 91% ,  but on RA during session down to 87% and HR 118 bpm (reported to nursing)    Mobility  Bed Mobility Bed Mobility: Supine to Sit Supine to Sit: 1: +2 Total assist;With rails;HOB elevated Supine to Sit: Patient Percentage: 60% Details for Bed Mobility Assistance: assistance for all movment of RLE unable to move this LE and assist with upper body and hips as well.  Transfers Transfers: Sit to Stand;Stand to Sit Sit to Stand: 1: +2 Total assist;With armrests;With upper extremity assist Sit to Stand: Patient Percentage: 60% Stand to Sit: 1: +2 Total assist;With upper extremity assist;With armrests Stand to Sit: Patient Percentage: 60% Details for Transfer Assistance: pt had to utilize all upper body strength to rise and sit down for the R LE was in so much paina dn unable to move the RLE in any fashion. one person had to assist with the the entire time.  Ambulation/Gait Ambulation/Gait Assistance: 1: +2 Total assist Ambulation/Gait: Patient Percentage: 60% Ambulation Distance (Feet): 20 Feet Assistive device: Rolling walker Ambulation/Gait Assistance Details: very difficult for patient. Using all Upper body and UEs support and total assist by therapist to progress RLE and instructed to not put weight on RLE for it would not be able to support him with no quad or hip muscle activation. This was very taxing for patient and very tiring with amount of effort to produce ambulation today.  Gait Pattern: Step-to pattern    Exercises Total Joint Exercises Ankle Circles/Pumps: AROM;Both;10 reps Short Arc Quad: PROM;Right;5 reps   PT Diagnosis:  PT Problem List:   PT Treatment Interventions:     PT Goals (current goals can now be found in the care plan section) Acute Rehab PT Goals Patient Stated Goal: I am not doing good and supposed to go home tomorrow, but I know that will not happen, I am not doing good.  PT Goal Formulation: With patient Time For Goal Achievement: 05/30/13 Potential to  Achieve Goals: Good  Visit Information  Last PT Received On: 05/17/13 Assistance Needed: +2 History of Present Illness: pt s/p direct anterior THA RLE    Subjective Data  Subjective: I feel a little better today, but feel like my medicines are not going down and I am hot!! Patient Stated Goal: I am not doing good and supposed to go home tomorrow, but I know that will not happen, I am not doing good.    Cognition  Cognition Arousal/Alertness: Awake/alert General Comments: more alret and awake today during session     Balance     End of Session PT - End of Session Equipment Utilized During Treatment: Gait belt Activity Tolerance: Patient tolerated treatment well (just discouraged by limitationas and progress) Patient left: in chair;with family/visitor present Nurse Communication: Mobility status   GP     [name] [name] 05/17/2013, 1:08 PM Marella Bile, PT Pager: [phone] 05/17/2013

## 2013-05-17 NOTE — Consult Note (Signed)
[facility] Medical Consultation  KIT MOLLETT ZOX:[id number] DOB: 04/05/1961 DOA: 05/15/2013 PCP: Milda Smart, MD   Requesting physician: Dr. Otelia Sergeant Date of consultation: 05/17/2013 Reason for consultation: hypoxemia and tachycardia  Impression/Recommendations Hypoxemia -Concerned about pulmonary embolus -CT angiogram of the chest -Supplemental oxygen -Check ABG -Patient is not currently in any respiratory distress Sinus tachycardia -Concerned about pulmonary embolus -May be related to fever -Repeat EKG -Patient states that his pain is controlled. -Transfer patient to telemetry -TSH, CMP -start IVF Abdominal discomfort/distention -Concerned about ileus; patient is passing flatus -Acute abdominal series -Continue cathartics Diabetes mellitus type 2 -Hemoglobin A1c 10.5 -Discontinue oral hypoglycemic agents -Start patient on insulin -Start with NovoLog sliding scale -Ultimately need long-acting insulin prior to discharge -Patient endorsed noncompliance with outpatient medications Thrombocytopenia -Appears to be chronic with review of the records dating back to 2010 -Continue to monitor for now Degenerative joint disease -Status post right total hip arthroplasty -Per orthopedic service -May ultimately need workup as an outpatient   I will followup again tomorrow. Please contact me if I can be of assistance in the meanwhile. Thank you for this consultation.  Chief Complaint: Dyspnea and hypoxemia  HPI:  52 year old male with a history of diabetes mellitus, hypertension, osteoarthritis  presented to the hospital on 05/15/2013 for elective right total hip arthroplasty. This was performed by Dr. Magnus Ivan. The patient did not have any immediate postoperative complications. However since the surgery, the patient has become increasingly tachycardic. EKG obtained on 05/16/2013 revealed sinus tachycardia with nonspecific T-wave flattening. The patient has remained  hemodynamically stable. In addition, the patient has had increasing hypoxemia postoperatively. He is required 2 L nasal cannula with oxygen saturation of 91%. As a result, TRH is consulted.  Currently, the patient complains of some mild dyspnea. He denies any chest discomfort,  vomiting, diarrhea, hematochezia, melena, dysuria headache, dizziness. He complains of tightness in his abdomen and abdominal discomfort. His last bowel movement was on 05/14/2013. He is passing flatus. The patient was given a total of 1 L of fluid yesterday without much improvement of his tachycardia. Today, the patient had a low-grade temperature 100.33F. WBC was 9.5. Hemoglobin is 11.5. Platelets 126,000. BMP on 05/16/2013 showed sodium 133. Hemoglobin A1c on 05/15/2013 was 10.5. Review of Systems:  Constitutional:  No weight loss, night sweats,  fatigue.  Head&Eyes: No headache.  No vision loss.  No eye pain or scotoma ENT:  No Difficulty swallowing,Tooth/dental problems,Sore throat,   Cardio-vascular:  No chest pain, Orthopnea, PND, swelling in lower extremities,  dizziness, palpitations  GI:  No heartburn, indigestion,nausea,diarrhea, loss of appetite, hematochezia, melena Resp:   No excess mucus, no productive cough,No coughing up of blood.No change in color of mucus.No wheezing.No chest wall deformity  Skin:  no rash or lesions.  GU:  no dysuria, change in color of urine, no urgency or frequency. No flank pain.  Musculoskeletal:  No joint pain or swelling. No decreased range of motion. No back pain.  Psych:  No change in mood or affect. No depression or anxiety. Neurologic: No headache, no dysesthesia, no focal weakness, no vision loss. No syncope   Past Medical History  Diagnosis Date  . Hypertension   . Diabetes mellitus   . Arthritis    Past Surgical History  Procedure Laterality Date  . Clavicle surgery  2010  . Umbilical hernia repair  2009  . Joint replacement      metal plate in lft knee   . Ankle surgery  1996  L ankle   Social History:  reports that he has been smoking Cigars.  He does not have any smokeless tobacco history on file. He reports that he drinks alcohol. He reports that he does not use illicit drugs.  Family History  Problem Relation Age of Onset  . Diabetes Mother   . Stroke Mother   . Leukemia Maternal Aunt     No Known Allergies   Prior to Admission medications   Medication Sig Start Date End Date Taking? Authorizing Provider  amLODipine (NORVASC) 5 MG tablet Take 5 mg by mouth every morning.   Yes Historical Provider, MD  aspirin EC 81 MG tablet Take 81 mg by mouth daily.   Yes Historical Provider, MD  Canagliflozin (INVOKANA) 300 MG TABS Take 300 mg by mouth every morning.   Yes Historical Provider, MD  CINNAMON PO Take 1,000 mg by mouth 2 (two) times daily.   Yes Historical Provider, MD  fish oil-omega-3 fatty acids 1000 MG capsule Take 1 g by mouth daily.   Yes Historical Provider, MD  gabapentin (NEURONTIN) 300 MG capsule Take 300 mg by mouth at bedtime.   Yes Historical Provider, MD  glipiZIDE (GLUCOTROL) 5 MG tablet Take 5 mg by mouth 2 (two) times daily before a meal.   Yes Historical Provider, MD  losartan (COZAAR) 100 MG tablet Take 100 mg by mouth every morning.    Yes Historical Provider, MD  metFORMIN (GLUCOPHAGE) 1000 MG tablet Take 1,000 mg by mouth 2 (two) times daily with a meal.   Yes Historical Provider, MD  Multiple Vitamins-Minerals (MULTIVITAMINS THER. W/MINERALS) TABS Take 1 tablet by mouth daily.    Yes Historical Provider, MD  vitamin B-12 (CYANOCOBALAMIN) 100 MCG tablet Take 100 mcg by mouth daily.    Yes Historical Provider, MD    Physical Exam: Filed Vitals:   05/17/13 0513 05/17/13 0800 05/17/13 1200 05/17/13 1215  BP: 123/74   157/91  Pulse: 112   118  Temp: 99.7 F (37.6 C)     TempSrc: Oral     Resp: 16 16 16    Height:      Weight:      SpO2: 99% 100% 100% 91%   General:  A&O x 3, NAD, nontoxic,  pleasant/cooperative Head/Eye: No conjunctival hemorrhage, no icterus, [place]/AT, No nystagmus ENT:  No icterus,  No thrush, good dentition, no pharyngeal exudate Neck:  No masses, no lymphadenpathy, no bruits CV:  RRR, no rub, no gallop, no S3 Lung:  CTAB, good air movement, no wheeze, no rhonchi Abdomen: soft/NT, +BS, nondistended, no peritoneal signs Ext: No cyanosis, No rashes, No petechiae, No lymphangitis, No edema Neuro: CNII-XII intact, strength 4/5 in bilateral upper and lower extremities, no dysmetria  Labs on Admission:  Basic Metabolic Panel:  Recent Labs Lab 05/11/13 1440 05/16/13 0420  NA 137 133*  K 3.3* 4.0  CL 102 99  CO2 21 22  GLUCOSE 293* 199*  BUN 11 10  CREATININE 0.91 0.96  CALCIUM 9.7 8.8   Liver Function Tests: No results found for this basename: AST, ALT, ALKPHOS, BILITOT, PROT, ALBUMIN,  in the last 168 hours No results found for this basename: LIPASE, AMYLASE,  in the last 168 hours No results found for this basename: AMMONIA,  in the last 168 hours CBC:  Recent Labs Lab 05/11/13 1440 05/16/13 0420 05/16/13 2202 05/17/13 0420  WBC 3.9* 8.1  --  9.4  HGB 14.0 12.7* 11.7* 11.5*  HCT 41.0 38.1* 34.5* 34.9*  MCV  83.8 85.8  --  86.4  PLT 160 147*  --  126*   Cardiac Enzymes: No results found for this basename: CKTOTAL, CKMB, CKMBINDEX, TROPONINI,  in the last 168 hours BNP: No components found with this basename: POCBNP,  CBG:  Recent Labs Lab 05/16/13 1100 05/16/13 1642 05/16/13 2131 05/17/13 0737 05/17/13 1138  GLUCAP 194* 143* 139* 89 100*    Radiological Exams on Admission: Dg Hip Complete Right  05/15/2013   CLINICAL DATA:  Status post total hip arthroplasty. History of osteoarthritis.  EXAM: RIGHT HIP - COMPLETE 2+ VIEW  COMPARISON:  04/29/2013 MRI 9  FINDINGS: The patient has undergone right total hip arthroplasty. The hip appears located on the frontal view. No evidence for acute fracture.  IMPRESSION: Status post total hip  arthroplasty.  No adverse features.   Electronically Signed   By: Rosalie Gums M.D.   On: 05/15/2013 16:55   Dg Pelvis Portable  05/15/2013   CLINICAL DATA:  Postop right hip arthroplasty  EXAM: PORTABLE PELVIS 1-2 VIEWS  COMPARISON:  Operative image, 05/15/2013  FINDINGS: Single view of the right hip prosthesis shows the acetabular and prosthetic components to be well-seated in well aligned. There is no acute fracture or evidence of an operative complication.  IMPRESSION: Right hip arthroplasty is well seated and aligned.   Electronically Signed   By: Amie Portland M.D.   On: 05/15/2013 17:42   Dg Chest Port 1 View  05/17/2013   CLINICAL DATA:  Shortness of breath, abdominal pain  EXAM: PORTABLE CHEST - 1 VIEW  COMPARISON:  05/11/2013  FINDINGS: Lung volumes are extremely low with curvilinear lower lobe atelectasis bilaterally. No pleural effusion. Heart size upper limits of normal. Left clavicular side plate and screw fixation noted.  IMPRESSION: Low lung volumes bilaterally with bibasilar platelike atelectasis.   Electronically Signed   By: Christiana Pellant M.D.   On: 05/17/2013 13:56   Dg Hip Portable 1 View Right  05/15/2013   CLINICAL DATA:  Postop right hip arthroplasty  EXAM: PORTABLE RIGHT HIP - 1 VIEW  COMPARISON:  Operative view, 05/15/2013  FINDINGS: Cross-table lateral view of the right hip prosthesis demonstrates the femoral and acetabular components to be well seated and aligned. No acute fracture or evidence of an operative complication.  IMPRESSION: New right hip prosthesis is well-seated and well-aligned.   Electronically Signed   By: Amie Portland M.D.   On: 05/15/2013 17:42   Dg C-arm 1-60 Min-no Report  05/15/2013   CLINICAL DATA: right anterior hip replacement   C-ARM 1-60 MINUTES  Fluoroscopy was utilized by the requesting physician.  No radiographic  interpretation.     EKG: Independently reviewed. Sinus tachycardia, left axis deviation, nonspecific T wave flattening  Time  spent: 70 min  Tajha Sammarco [facility] Pager [phone]  If 7PM-7AM, please contact night-coverage [email or web address] Password [email or web address] 05/17/2013, 2:22 PM

## 2013-05-17 NOTE — Progress Notes (Signed)
05/17/13 1500  PT Visit Information  Last PT Received On 05/17/13  Assistance Needed +2  History of Present Illness pt s/p direct anterior THA RLE  PT Time Calculation  PT Start Time 1445  PT Stop Time 1500  PT Time Calculation (min) 15 min  Subjective Data  Subjective I just feel bloated with gas.   Precautions  Precautions None  Precaution Comments direct anterior  Restrictions  Weight Bearing Restrictions No  Other Position/Activity Restrictions WBAT  Cognition  Arousal/Alertness Awake/alert  Bed Mobility  Bed Mobility Sit to Supine  Sit to Supine 1: +2 Total assist  Sit to Supine: Patient Percentage 50%  Details for Bed Mobility Assistance assistance for all movment of RLE unable to move this LE and assist with upper body and hips as well.   Transfers  Transfers Stand Pivot Transfers  Sit to Stand 1: +2 Total assist;With armrests;With upper extremity assist  Sit to Stand: Patient Percentage 60%  Details for Transfer Assistance total asssit with RLE for movment progression and ease for stand to sit   PT General Charges  $$ ACUTE PT VISIT 1 Procedure  PT Treatments  $Therapeutic Activity 8-22 mins  Limited session this afternoon due to pt transferring to 4th floor at the moment.  Marella Bile, PT Pager: [phone] 05/17/2013

## 2013-05-18 ENCOUNTER — Inpatient Hospital Stay (HOSPITAL_COMMUNITY): Payer: BC Managed Care – PPO

## 2013-05-18 DIAGNOSIS — M79609 Pain in unspecified limb: Secondary | ICD-10-CM

## 2013-05-18 DIAGNOSIS — I10 Essential (primary) hypertension: Secondary | ICD-10-CM

## 2013-05-18 LAB — GLUCOSE, CAPILLARY
Glucose-Capillary: 127 mg/dL — ABNORMAL HIGH (ref 70–99)
Glucose-Capillary: 101 mg/dL — ABNORMAL HIGH (ref 70–99)
Glucose-Capillary: 155 mg/dL — ABNORMAL HIGH (ref 70–99)
Glucose-Capillary: 127 mg/dL — ABNORMAL HIGH (ref 70–99)

## 2013-05-18 LAB — BASIC METABOLIC PANEL
BUN: 12 mg/dL (ref 6–23)
CO2: 21 mEq/L (ref 19–32)
Calcium: 9 mg/dL (ref 8.4–10.5)
Chloride: 101 mEq/L (ref 96–112)
Creatinine, Ser: 0.99 mg/dL (ref 0.50–1.35)
Glucose, Bld: 104 mg/dL — ABNORMAL HIGH (ref 70–99)

## 2013-05-18 LAB — CBC
HCT: 33.3 % — ABNORMAL LOW (ref 39.0–52.0)
Hemoglobin: 11.2 g/dL — ABNORMAL LOW (ref 13.0–17.0)
MCH: 28.7 pg (ref 26.0–34.0)
MCV: 85.4 fL (ref 78.0–100.0)
Platelets: 128 10*3/uL — ABNORMAL LOW (ref 150–400)
RBC: 3.9 MIL/uL — ABNORMAL LOW (ref 4.22–5.81)

## 2013-05-18 MED ORDER — BISACODYL 10 MG RE SUPP: 10.0000 mg | Freq: Every day | RECTAL | Status: DC | PRN

## 2013-05-18 MED ORDER — METHOCARBAMOL 500 MG PO TABS: 500.0000 mg | ORAL_TABLET | Freq: Four times a day (QID) | ORAL | Status: DC | PRN

## 2013-05-18 MED ORDER — HYDROCODONE-ACETAMINOPHEN 7.5-325 MG PO TABS: 1.0000 | ORAL_TABLET | ORAL | Status: DC | PRN

## 2013-05-18 MED ORDER — POLYETHYLENE GLYCOL 3350 17 G PO PACK
17.0000 g | PACK | Freq: Two times a day (BID) | ORAL | Status: DC
  Administered 2013-05-18 – 2013-05-19 (×2): 17 g via ORAL
  Filled 2013-05-18 (×4): qty 1

## 2013-05-18 MED ORDER — INFLUENZA VAC SPLIT QUAD 0.5 ML IM SUSP
0.5000 mL | INTRAMUSCULAR | Status: AC
  Administered 2013-05-19: 0.5 mL via INTRAMUSCULAR
  Filled 2013-05-18 (×2): qty 0.5

## 2013-05-18 MED ORDER — ASPIRIN 325 MG PO TBEC: 325.0000 mg | DELAYED_RELEASE_TABLET | Freq: Two times a day (BID) | ORAL | Status: DC

## 2013-05-18 NOTE — Progress Notes (Signed)
Subjective: 3 Days Post-Op Procedure(s) (LRB): RIGHT TOTAL HIP ARTHROPLASTY ANTERIOR APPROACH (Right) Patient reports pain as mild.  Patient states he is feeling better today. No acute distress apparent that patients heart rate is not 10 as recorded.  Objective: Vital signs in last 24 hours: Temp:  [97.3 F (36.3 C)-100.6 F (38.1 C)] 98.6 F (37 C) (11/17 0530) Pulse Rate:  [10-118] 10 (11/17 0530) Resp:  [16-18] 16 (11/17 0530) BP: (122-157)/(74-91) 122/74 mmHg (11/17 0530) SpO2:  [91 %-100 %] 97 % (11/17 0530)  Intake/Output from previous day: 11/16 0701 - 11/17 0700 In: 692.5 [P.O.:480; I.V.:212.5] Out: 1525 [Urine:1525] Intake/Output this shift:     Recent Labs  05/16/13 0420 05/16/13 2202 05/17/13 0420 05/18/13 0428  HGB 12.7* 11.7* 11.5* 11.2*    Recent Labs  05/17/13 0420 05/18/13 0428  WBC 9.4 7.1  RBC 4.04* 3.90*  HCT 34.9* 33.3*  PLT 126* 128*    Recent Labs  05/17/13 1652 05/18/13 0428  NA 133* 136  K 3.2* 3.3*  CL 97 101  CO2 22 21  BUN 13 12  CREATININE 1.08 0.99  GLUCOSE 105* 104*  CALCIUM 9.4 9.0   No results found for this basename: LABPT, INR,  in the last 72 hours  Sensation intact distally Intact pulses distally Incision: scant drainage General alert and oriented no acute distress  Assessment/Plan: 3 Days Post-Op Procedure(s) (LRB): RIGHT TOTAL HIP ARTHROPLASTY ANTERIOR APPROACH (Right) Up with therapy Possible discharge tomorrow Negative work up for DVT  Richardean Canal 05/18/2013, 7:43 AM

## 2013-05-18 NOTE — Progress Notes (Signed)
Physical Therapy Treatment Patient Details Name: [name] [name] MRN: [id number] DOB: 06/17/1961 Today's Date: 05/18/2013 Time: 9562-1308 PT Time Calculation (min): 41 min  PT Assessment / Plan / Recommendation  History of Present Illness pt s/p direct anterior THA RLE   PT Comments   POD # 3 am session.  Demonstrated and instructed pt how to use a belt to assist R LE off and informed about the poss of purchasing a "Leg Lifter".  Pt was able to get self OOB however require increased time, nearly 4 min to get self from supine to EOB.  Amb in hallway increased distance.  Pt still c/o "thigh numbness" and 3/10 pain however did increase his gait distance and tolerance this session.  Positioned in recliner and performed some THR TE's.   Follow Up Recommendations  Home health PT     Does the patient have the potential to tolerate intense rehabilitation     Barriers to Discharge        Equipment Recommendations  Rolling walker with 5" wheels    Recommendations for Other Services    Frequency 7X/week   Progress towards PT Goals Progress towards PT goals: Progressing toward goals  Plan      Precautions / Restrictions Precautions Precautions: None Precaution Comments: direct anterior Restrictions Weight Bearing Restrictions: No Other Position/Activity Restrictions: WBAT    Pertinent Vitals/Pain C/o 3/10 pain and "alot of soreness/swelling"     Mobility  Bed Mobility Bed Mobility: Supine to Sit;Sitting - Scoot to Edge of Bed Supine to Sit: 4: Min guard Sitting - Scoot to [place] of Bed: 4: Min guard Details for Bed Mobility Assistance: demonstrated and instructed pt how to use a belt to assist R LE off bed and 25% VC's on proper tech.  Pt required increased time, nearly 4 min to complete task.   Transfers Transfers: Sit to Stand;Stand to Sit Sit to Stand: 4: Min guard;From bed Stand to Sit: 4: Min guard;To chair/3-in-1 Details for Transfer Assistance: 50% VC's on proper tech and  hand placement as pt attempted bu pulling self up from walker.   Ambulation/Gait Ambulation/Gait Assistance: 4: Min guard Ambulation Distance (Feet): 150 Feet Assistive device: Rolling walker Ambulation/Gait Assistance Details: increased time with difficulty advancing R LE due to "sore" hip flexors reporting pain as 3/10.  Pt was able to advance R LE without assist this session and became more tolerable with increased gait distance.   Gait Pattern: Step-to pattern Gait velocity: decreased    Exercises  10 reps LAQ's 10 reps ankle pumps    PT Goals (current goals can now be found in the care plan section)    Visit Information  Last PT Received On: 05/18/13 Assistance Needed: +1 History of Present Illness: pt s/p direct anterior THA RLE    Subjective Data      Cognition       Balance     End of Session PT - End of Session Equipment Utilized During Treatment: Gait belt Activity Tolerance: Patient tolerated treatment well Patient left: in chair;with family/visitor present   Felecia Shelling  PTA [facility]  Acute  Rehab Pager      [phone]

## 2013-05-18 NOTE — Care Management Note (Addendum)
    Page 1 of 2   05/19/2013     11:29:16 AM   CARE MANAGEMENT NOTE 05/19/2013  Patient:  [name] [name], [name] [name]   Account Number:  [id number]  Date Initiated:  05/16/2013  Documentation initiated by:  [facility]  Subjective/Objective Assessment:   RIGHT TOTAL HIP ARTHROPLASTY ANTERIOR APPROACH (Right)     Action/Plan:   lives at home with wife   Anticipated DC Date:  05/19/2013   Anticipated DC Plan:  HOME W HOME HEALTH SERVICES      DC Planning Services  CM consult      Choice offered to / List presented to:  C-1 Patient   DME arranged  WALKER - ROLLING  BEDSIDE COMMODE      DME agency  [facility]     HH arranged  HH-2 PT      [facility] agency  [facility]   Status of service:  Completed, signed off Medicare Important Message given?   (If response is "NO", the following Medicare IM given date fields will be blank) Date Medicare IM given:   Date Additional Medicare IM given:    Discharge Disposition:  HOME W HOME HEALTH SERVICES  Per UR Regulation:  Reviewed for med. necessity/level of care/duration of stay  If discussed at Long Length of Stay Meetings, dates discussed:    Comments:  05/18/13 Khloee Garza RN,BSN NCM [phone] [facility] REP DEBBIE AWARE OF D/C,& HHPT ORDER.ORDERED FOR RW,3N1-[facility] REP HAS BROUGHT TO [facility].  05/18/13 Pope Brunty RN,BSN NCM [phone] TRANSFER FROM 6E.POD#3 R THA.NOTED PT NOTES STATES 1-2+ASST,DEPENDING ON PROGRESS,IF HOME [facility] FOLLOWING DEBBIE REP AWARE.IF DME NEEDED CAN ARRANGE IF D/C PLAN HOME.  05/16/2013 1145 NCM spoke to wife. Pt was preoperatively set up with [facility] for [facility]. Wife states pt has no DME at home. Will order DME at time of dc with [facility].  Isidoro Donning RN CCM Case Mgmt phone [phone]

## 2013-05-18 NOTE — Progress Notes (Signed)
Patient ID: [name] [name], male   DOB: October 06, 1960, 52 y.o.   MRN: [id number] Doing much better overall. Can discharge to home tomorrow 11/18.

## 2013-05-18 NOTE — Consult Note (Signed)
[facility] PROGRESS NOTE  [name] [name] VHQ:[id number] DOB: 11/10/1960 DOA: 05/15/2013 PCP: Milda Smart, MD  Requesting physician: Dr. Otelia Sergeant  Date of consultation: 05/17/2013  Reason for consultation: hypoxemia and tachycardia   Brief narrative: 52 year old male with past medical history of diabetes mellitus, hypertension, osteoarthritis who presented to [facility] ED 05/15/2013 for an elective right total hip arthroplasty. The patient did not have any immediate postoperative complications. However since the surgery, the patient has become increasingly tachycardic. EKG obtained on 05/16/2013 revealed sinus tachycardia with nonspecific T-wave flattening. In addition, the patient has had an episode of desaturation and his O2 sats were around low 90's with 2 L [place] oxygen support. CT angio chest ruled out PE.   Impression/Recommendations   Acute hypoxic respiratory failure - CT angio chest ruled out pulmonary embolism; unclear etiology of hypoxia, no significant findings of CXR; perhaps atelectasis which may cause lower oxygen sats - I placed an order for LE venous doppler to rule out DVT; will follow up on this result  - respiratory status is stable at this time - saturating 97% on room air. Ambulate pt prior to discharge to see if oxygen remains stable; If O2 saturation less than 89% on ambulation then patient may benefit form supplemental oxygen at home on discharge  Sinus tachycardia  - on 12 lead EKG - related to pain - no chest pain Abdominal discomfort/distention  - Concerned about ileus; patient is passing flatus  - added miralax to colace - had 2 BM recently -Continue cathartics  Diabetes mellitus type 2  - Hemoglobin A1c 10.5  - appreciate diabetic coordinator consult - pt is on glipizide and metformin; I dont see previous A1c on file; I think it is reasonable to have him on same regimen on discharge and then repeat A1c in 3 months and see if further change is warranted.  If he does not have PCP he can follow in community health and wellness clinic with me or one of my colleagues and we can follow up on this issue. [phone]) Thrombocytopenia  - Appears to be chronic with review of the records dating back to 2010  Degenerative joint disease  -Status post right total hip arthroplasty  -Per orthopedic service   Will sign off; for further questions call [phone] or my pager [phone]  Code Status: full code Family Communication: wife at the bedside Disposition Plan: home likely in next 24 hours  Manson Passey, MD  [facility] Pager [phone]  If 7PM-7AM, please contact night-coverage [email or web address] Password [facility] 05/18/2013, 12:35 PM   LOS: 3 days    HPI/Subjective: Feels better this am with no shortness of breath.   Objective: Filed Vitals:   05/18/13 0000 05/18/13 0357 05/18/13 0530 05/18/13 0941  BP:   122/74 122/73  Pulse:   100 91  Temp:   98.6 F (37 C)   TempSrc:   Oral   Resp: 16 16 16    Height:      Weight:      SpO2: 96% 97% 97%     Intake/Output Summary (Last 24 hours) at 05/18/13 1235 Last data filed at 05/18/13 0931  Gross per 24 hour  Intake 1352.5 ml  Output   1325 ml  Net   27.5 ml    Exam:   General:  Pt is alert, follows commands appropriately, not in acute distress  Cardiovascular: Regular rate and rhythm, S1/S2, no murmurs, no rubs, no gallops  Respiratory: Clear to auscultation bilaterally, no wheezing, no crackles, no rhonchi  Abdomen: Soft, non tender, non distended, bowel sounds present, no guarding  Extremities: Pulses DP and PT palpable bilaterally  Neuro: Grossly nonfocal  Data Reviewed: Basic Metabolic Panel:  Recent Labs Lab 05/11/13 1440 05/16/13 0420 05/17/13 1652 05/18/13 0428  NA 137 133* 133* 136  K 3.3* 4.0 3.2* 3.3*  CL 102 99 97 101  CO2 21 22 22 21   GLUCOSE 293* 199* 105* 104*  BUN 11 10 13 12   CREATININE 0.91 0.96 1.08 0.99  CALCIUM 9.7 8.8 9.4 9.0   Liver  Function Tests:  Recent Labs Lab 05/17/13 1652  AST 27  ALT 39  ALKPHOS 75  BILITOT 0.8  PROT 7.7  ALBUMIN 3.3*   No results found for this basename: LIPASE, AMYLASE,  in the last 168 hours No results found for this basename: AMMONIA,  in the last 168 hours CBC:  Recent Labs Lab 05/11/13 1440 05/16/13 0420 05/16/13 2202 05/17/13 0420 05/18/13 0428  WBC 3.9* 8.1  --  9.4 7.1  HGB 14.0 12.7* 11.7* 11.5* 11.2*  HCT 41.0 38.1* 34.5* 34.9* 33.3*  MCV 83.8 85.8  --  86.4 85.4  PLT 160 147*  --  126* 128*   Cardiac Enzymes: No results found for this basename: CKTOTAL, CKMB, CKMBINDEX, TROPONINI,  in the last 168 hours BNP: No components found with this basename: POCBNP,  CBG:  Recent Labs Lab 05/17/13 1138 05/17/13 1659 05/17/13 2155 05/18/13 0727 05/18/13 1207  GLUCAP 100* 107* 127* 101* 155*    Recent Results (from the past 240 hour(s))  SURGICAL PCR SCREEN     Status: None   Collection Time    05/11/13  1:50 PM      Result Value Range Status   MRSA, PCR NEGATIVE  NEGATIVE Final   Staphylococcus aureus NEGATIVE  NEGATIVE Final     Studies: Ct Angio Chest Pe W/cm &/or Wo Cm  05/17/2013   CLINICAL DATA:  Sudden onset tachycardia and hypoxemia. Recent surgery.  EXAM: CT ANGIOGRAPHY CHEST WITH CONTRAST  TECHNIQUE: Multidetector CT imaging of the chest was performed using the standard protocol during bolus administration of intravenous contrast. Multiplanar CT image reconstructions including MIPs were obtained to evaluate the vascular anatomy.  CONTRAST:  OMNIPAQUE IOHEXOL 350 MG/ML SOLN  COMPARISON:  Chest radiograph same date  FINDINGS: Bovine arch type configuration, a normal variant. Suboptimal contrast bolus opacification. The study is adequate for evaluation for pulmonary embolism up to and including both main pulmonary arteries but is decreased in sensitivity for more distal evaluation. No central focal filling defect is seen to suggest large pulmonary  embolism. Heart size is normal. No pericardial or pleural effusion. No lymphadenopathy.  Left clavicular fusion sideplate and screws partly visualized. Subpleural dependent areas of patchy and curvilinear atelectasis are noted. No focal consolidation, mass, or nodule. Central airways are patent.  No acute osseous abnormality.  Review of the MIP images confirms the above findings.  IMPRESSION: No CT evidence for acute pulmonary embolism up to and involving either main pulmonary artery.  Dependent bibasilar patchy and curvilinear atelectasis reidentified. Very early pneumonia could appear similar but is less likely given the clinical presentation and radiographic appearance.   Electronically Signed   By: Christiana Pellant M.D.   On: 05/17/2013 15:39   Dg Chest Port 1 View  05/17/2013   CLINICAL DATA:  Shortness of breath, abdominal pain  EXAM: PORTABLE CHEST - 1 VIEW  COMPARISON:  05/11/2013  FINDINGS: Lung volumes are extremely low with curvilinear lower lobe  atelectasis bilaterally. No pleural effusion. Heart size upper limits of normal. Left clavicular side plate and screw fixation noted.  IMPRESSION: Low lung volumes bilaterally with bibasilar platelike atelectasis.   Electronically Signed   By: Christiana Pellant M.D.   On: 05/17/2013 13:56   Dg Abd 2 Views  05/17/2013   CLINICAL DATA:  Abdominal pain and distention for 1 day  EXAM: ABDOMEN - 2 VIEW  COMPARISON:  None.  FINDINGS: Diffuse prominence of multiple loops of bowel without differential air-fluid levels. No decubitus evidence for free air. No abnormal calcific opacity. Radiodense material projecting over the bladder likely represents contrast from recent separate examination. Right hip prosthesis partly visualized.  IMPRESSION: Diffuse prominence of multiple loops of bowel with a pattern most typical for ileus.   Electronically Signed   By: Christiana Pellant M.D.   On: 05/17/2013 16:03   Dg Abd Portable 2v  05/18/2013   CLINICAL DATA:  Ileus  EXAM:  PORTABLE ABDOMEN - 2 VIEW  COMPARISON:  Radiography from yesterday  FINDINGS: Persistent gaseous dilation of the colon diffusely (note the rectum is not included on this examination). Maximal sigmoid diameter is slightly increased from prior, now 8 cm - previously 7.5 cm. No evidence of pneumatosis or pneumoperitoneum. No evidence of small bowel obstruction.  IMPRESSION: Similar degree of diffuse colonic distention - again favoring ileus.   Electronically Signed   By: Tiburcio Pea M.D.   On: 05/18/2013 05:48    Scheduled Meds: . amLODipine  5 mg Oral q morning - 10a  . aspirin EC  325 mg Oral BID PC  . docusate sodium  100 mg Oral BID  . [START ON 05/19/2013] influenza vac split quadrivalent PF  0.5 mL Intramuscular Tomorrow-1000  . insulin aspart  0-15 Units Subcutaneous TID WC  . insulin aspart  0-5 Units Subcutaneous QHS  . losartan  100 mg Oral q morning - 10a  . multivitamin with minerals  1 tablet Oral Daily  . sodium chloride  500 mL Intravenous Once  . vitamin B-12  100 mcg Oral Daily   Continuous Infusions: . sodium chloride 75 mL/hr at 05/18/13 0402

## 2013-05-18 NOTE — Progress Notes (Addendum)
Physical Therapy Treatment Patient Details Name: [name] [name] MRN: [id number] DOB: 04-19-1961 Today's Date: 05/18/2013 Time: 1191-4782 PT Time Calculation (min): 35 min  PT Assessment / Plan / Recommendation  History of Present Illness pt s/p direct anterior THA RLE   PT Comments   POD # 3 pm session.  Assisted out of recliner to amb in hallway, practiced going up one step backward with RW, assisted to BR then back to bed.  Pt progressing. Will practice 7 steps with one rail and one crutch in am.   Follow Up Recommendations  Home health PT     Does the patient have the potential to tolerate intense rehabilitation     Barriers to Discharge        Equipment Recommendations  Rolling walker with 5" wheels    Recommendations for Other Services    Frequency 7X/week   Progress towards PT Goals Progress towards PT goals: Progressing toward goals  Plan      Precautions / Restrictions Precautions Precautions: None Precaution Comments: direct anterior Restrictions Weight Bearing Restrictions: No Other Position/Activity Restrictions: WBAT    Pertinent Vitals/Pain C/o 4/10 ICE applied    Mobility  Bed Mobility Bed Mobility: Sit to Supine Supine to Sit: 4: Min guard Sitting - Scoot to Edge of Bed: 4: Min guard Sit to Supine: 4: Min guard Details for Bed Mobility Assistance: Pt used a belt to Assist R LE up onto bed with 25% VC's proper tech and was able to complete with increased time. Transfers Transfers: Sit to Stand;Stand to Sit Sit to Stand: 4: Min guard;From chair/3-in-1 Stand to Sit: 4: Min guard;To toilet;To bed Details for Transfer Assistance: 25% VC's on proper tech and increased time Ambulation/Gait Ambulation/Gait Assistance: 4: Min guard Ambulation Distance (Feet): 150 Feet Assistive device: Rolling walker Ambulation/Gait Assistance Details: increased time and 25% VC's on safety with turns and increased time to complete due to MAX c/o "soreness',  "tightness". Gait Pattern: Step-to pattern;Step-through pattern Gait velocity: decreased Stairs: Yes Stairs Assistance: 4: Min assist Stairs Assistance Details (indicate cue type and reason): 50% VC's on proper sequencing and safety Stair Management Technique: No rails;Backwards;With walker Number of Stairs: 1    PT Goals (current goals can now be found in the care plan section)    Visit Information  Last PT Received On: 05/18/13 Assistance Needed: +1 History of Present Illness: pt s/p direct anterior THA RLE    Subjective Data      Cognition       Balance     End of Session PT - End of Session Equipment Utilized During Treatment: Gait belt Activity Tolerance: Patient tolerated treatment well Patient left: in bed;with call bell/phone within reach   Felecia Shelling  PTA [facility]  Acute  Rehab Pager      [phone]

## 2013-05-18 NOTE — Progress Notes (Signed)
Inpatient Diabetes Program Recommendations  AACE/ADA: New Consensus Statement on Inpatient Glycemic Control (2013)  Target Ranges:  Prepandial:   less than 140 mg/dL      Peak postprandial:   less than 180 mg/dL (1-2 hours)      Critically ill patients:  140 - 180 mg/dL   Reason for Visit: Diabetes Consult  Pt states he has been depressed and eating lots of candy and sweets, regular sodas and lemonade, and hasn't been interested in taking care of his diabetes.  Has meters at home, but states they don't work right. States he has been to diabetes classes in the past and also attended with other family members who also have diabetes.  "I know what to do, I just haven't felt like doing it." Pt states he misses his diabetes medicines occasionally and just forgets to take them.  Results for [name], [name] [name] (MRN [id number]) as of 05/18/2013 14:55  Ref. Range 05/17/2013 11:38 05/17/2013 16:59 05/17/2013 21:55 05/18/2013 07:27 05/18/2013 12:07  Glucose-Capillary Latest Range: 70-99 mg/dL 147 (H) 829 (H) 562 (H) 101 (H) 155 (H)  Results for [name], [name] [name] (MRN [id number]) as of 05/18/2013 14:55  Ref. Range 05/15/2013 18:43  Hemoglobin A1C Latest Range: <5.7 % 10.5 (H)   ? Whether HgbA1C is accurate with low H/H. Discussed importance of controlling blood sugars to prevent long-term complications.  Recommend f/u with PCP to manage DM when pt is discharged from hospital. Would like prescription for glucose meter and strips. If he must be discharged on insulin, pt requests insulin pen instead of vial and syringe.    Blood sugars WNL here in hospital while on Carb-mod diet. Will order Living Well With Diabetes book and encouraged pt to view diabetes videos on pt ed channel. Does not want to go to OP Diabetes Education at this time.  Will continue to follow. Thank you. Ailene Ards, RD, LDN, CDE Inpatient Diabetes Coordinator [phone]

## 2013-05-18 NOTE — Progress Notes (Signed)
VASCULAR LAB PRELIMINARY  PRELIMINARY  PRELIMINARY  PRELIMINARY  Bilateral lower extremity venous duplex completed.    Preliminary report:  Bilateral:  No evidence of DVT, superficial thrombosis, or Baker's Cyst.   Crickett Abbett, RVS 05/18/2013, 4:02 PM

## 2013-05-19 ENCOUNTER — Encounter (HOSPITAL_COMMUNITY): Payer: Self-pay | Admitting: Orthopaedic Surgery

## 2013-05-19 LAB — GLUCOSE, CAPILLARY

## 2013-05-19 MED ORDER — METHOCARBAMOL 500 MG PO TABS: 500.0000 mg | ORAL_TABLET | Freq: Four times a day (QID) | ORAL | Status: AC | PRN

## 2013-05-19 MED ORDER — DSS 100 MG PO CAPS: 100.0000 mg | ORAL_CAPSULE | Freq: Two times a day (BID) | ORAL | Status: DC

## 2013-05-19 MED ORDER — HYDROCODONE-ACETAMINOPHEN 7.5-325 MG PO TABS: 1.0000 | ORAL_TABLET | ORAL | Status: DC | PRN

## 2013-05-19 MED ORDER — ASPIRIN 325 MG PO TBEC: 325.0000 mg | DELAYED_RELEASE_TABLET | Freq: Two times a day (BID) | ORAL | Status: DC

## 2013-05-19 NOTE — Progress Notes (Signed)
Physical Therapy Treatment Patient Details Name: [name] [name] MRN: [id number] DOB: Mar 18, 1961 Today's Date: 05/19/2013 Time: 4098-1191 PT Time Calculation (min): 48 min  PT Assessment / Plan / Recommendation  History of Present Illness pt s/p direct anterior THA RLE   PT Comments   POD # 4 pt progressing better and plans to D/c to home today.  Assisted OOB to amb in hallway, practiced going up one step backward, practiced going up 7 steps with one rail and one crutch, assisted to BR and toileting, then positioned in recliner with ICE to hip.  Pt given handout HEP.   Follow Up Recommendations  Home health PT     Does the patient have the potential to tolerate intense rehabilitation     Barriers to Discharge        Equipment Recommendations  Rolling walker with 5" wheels    Recommendations for Other Services    Frequency 7X/week   Progress towards PT Goals Progress towards PT goals: Progressing toward goals  Plan      Precautions / Restrictions Precautions Precautions: None Precaution Comments: direct anterior Restrictions Weight Bearing Restrictions: No Other Position/Activity Restrictions: WBAT    Pertinent Vitals/Pain C/o 2/10 pain "soreness" ICE applied    Mobility  Bed Mobility Bed Mobility: Supine to Sit Supine to Sit: 5: Supervision Sitting - Scoot to Edge of Bed: 5: Supervision Details for Bed Mobility Assistance: Pt used his leg lifter and was able to transition self from supine to EOB with increased time. Transfers Transfers: Sit to Stand;Stand to Sit Sit to Stand: 6: Modified independent (Device/Increase time);From bed;From toilet Stand to Sit: 6: Modified independent (Device/Increase time);To toilet;To chair/3-in-1 Details for Transfer Assistance: good use of hands and safety cognition Ambulation/Gait Ambulation/Gait Assistance: 5: Supervision Ambulation Distance (Feet): 285 Feet Assistive device: Rolling walker Ambulation/Gait Assistance  Details: increased time and < 25% VC's safety with turns and backward gait sequencing Gait Pattern: Step-to pattern;Step-through pattern Gait velocity: decreased Stairs: Yes Stairs Assistance: 4: Min guard Stairs Assistance Details (indicate cue type and reason): performed one step backward with RW with one VC on proper sequencing then performed 7 steps up forward with one rail and one crutch @ 25% VC's proper sequencing Stair Management Technique: One rail Right;With crutches;Backwards Number of Stairs: 8 (one step then 7 steps)     PT Goals (current goals can now be found in the care plan section)    Visit Information  Last PT Received On: 05/19/13 Assistance Needed: +1 History of Present Illness: pt s/p direct anterior THA RLE    Subjective Data      Cognition       Balance     End of Session PT - End of Session Equipment Utilized During Treatment: Gait belt Activity Tolerance: Patient tolerated treatment well Patient left: in chair;with call bell/phone within reach   Felecia Shelling  PTA [facility]  Acute  Rehab Pager      [phone]

## 2013-05-19 NOTE — Progress Notes (Signed)
Subjective: 4 Days Post-Op Procedure(s) (LRB): RIGHT TOTAL HIP ARTHROPLASTY ANTERIOR APPROACH (Right) Patient reports pain as moderate.  Tolerating diet. BM x 2. Positive flatus. No chest pain or SOB.  Objective: Vital signs in last 24 hours: Temp:  [98.8 F (37.1 C)-100 F (37.8 C)] 98.8 F (37.1 C) (11/18 0521) Pulse Rate:  [91-114] 100 (11/18 0521) Resp:  [14-20] 19 (11/18 0521) BP: (122-142)/(67-80) 130/80 mmHg (11/18 0521) SpO2:  [92 %-97 %] 92 % (11/18 0521)  Intake/Output from previous day: 11/17 0701 - 11/18 0700 In: 1402.5 [P.O.:240; I.V.:1162.5] Out: 1350 [Urine:1350] Intake/Output this shift: Total I/O In: -  Out: 300 [Urine:300]   Recent Labs  05/16/13 2202 05/17/13 0420 05/18/13 0428  HGB 11.7* 11.5* 11.2*    Recent Labs  05/17/13 0420 05/18/13 0428  WBC 9.4 7.1  RBC 4.04* 3.90*  HCT 34.9* 33.3*  PLT 126* 128*    Recent Labs  05/17/13 1652 05/18/13 0428  NA 133* 136  K 3.2* 3.3*  CL 97 101  CO2 22 21  BUN 13 12  CREATININE 1.08 0.99  GLUCOSE 105* 104*  CALCIUM 9.4 9.0   No results found for this basename: LABPT, INR,  in the last 72 hours  Neurovascular intact Sensation intact distally Intact pulses distally Dorsiflexion/Plantar flexion intact Incision: dressing C/D/I Compartment soft  Assessment/Plan: 4 Days Post-Op Procedure(s) (LRB): RIGHT TOTAL HIP ARTHROPLASTY ANTERIOR APPROACH (Right) Up with therapy Discharge home with home health  Richardean Canal 05/19/2013, 8:30 AM

## 2013-05-19 NOTE — Discharge Summary (Signed)
Patient ID: [name] [name] MRN: [id number] DOB/AGE: [birth year] 52 y.o.  Admit date: 05/15/2013 Discharge date: 05/19/2013  Admission Diagnoses:  Principal Problem:   Degenerative arthritis of right hip Active Problems:   Hypoxemia   Type II or unspecified type diabetes mellitus without mention of complication, uncontrolled   Thrombocytopenia, unspecified   Unspecified essential hypertension   Discharge Diagnoses:  S/p right total hip arthroplasty Tachycardia (Resolved) secondary to pain   Past Medical History  Diagnosis Date  . Hypertension   . Diabetes mellitus   . Arthritis     Surgeries: Procedure(s): RIGHT TOTAL HIP ARTHROPLASTY ANTERIOR APPROACH on 05/15/2013   Consultants:    Discharged Condition: Improved  Hospital Course: [name] [name] is an 52 y.o. male who was admitted 05/15/2013 for operative treatment ofDegenerative arthritis of hip. Patient has severe unremitting pain that affects sleep, daily activities, and work/hobbies. After pre-op clearance the patient was taken to the operating room on 05/15/2013 and underwent  Procedure(s): RIGHT TOTAL HIP ARTHROPLASTY ANTERIOR APPROACH.    Patient was given perioperative antibiotics: Anti-infectives   Start     Dose/Rate Route Frequency Ordered Stop   05/15/13 2100  ceFAZolin (ANCEF) IVPB 1 g/50 mL premix     1 g 100 mL/hr over 30 Minutes Intravenous Every 6 hours 05/15/13 1822 05/16/13 0442   05/15/13 1230  ceFAZolin (ANCEF) IVPB 2 g/50 mL premix     2 g 100 mL/hr over 30 Minutes Intravenous On call to O.R. 05/15/13 1214 05/15/13 1506       Patient was given sequential compression devices, early ambulation, and chemoprophylaxis to prevent DVT.  Patient benefited maximally from hospital stay and there were no complications.   Tachycardia workup of Chest CT and dopper negative for DVT.  Recent vital signs: Patient Vitals for the past 24 hrs:  BP Temp Temp src Pulse Resp SpO2  05/19/13 0521 130/80 mmHg  98.8 F (37.1 C) Oral 100 19 92 %  05/19/13 0400 - - - - 18 92 %  05/19/13 0000 - - - - 14 97 %  05/18/13 2330 - 98.9 F (37.2 C) Oral - - -  05/18/13 2132 140/72 mmHg 100 F (37.8 C) Oral 114 20 94 %  05/18/13 2000 - - - - 16 96 %  05/18/13 1520 142/67 mmHg 100 F (37.8 C) Oral 112 16 97 %     Recent laboratory studies:  Recent Labs  05/17/13 0420  05/17/13 1652 05/18/13 0428  WBC 9.4  --   --  7.1  HGB 11.5*  --   --  11.2*  HCT 34.9*  --   --  33.3*  PLT 126*  --   --  128*  NA  --   --  133* 136  K  --   --  3.2* 3.3*  CL  --   --  97 101  CO2  --   --  22 21  BUN  --   --  13 12  CREATININE  --   --  1.08 0.99  GLUCOSE  --   --  105* 104*  CALCIUM  --   < > 9.4 9.0  < > = values in this interval not displayed.   Discharge Medications:     Medication List         amLODipine 5 MG tablet  Commonly known as:  NORVASC  Take 5 mg by mouth every morning.     aspirin 325 MG EC tablet  Take  1 tablet (325 mg total) by mouth 2 (two) times daily after a meal.     CINNAMON PO  Take 1,000 mg by mouth 2 (two) times daily.     DSS 100 MG Caps  Take 100 mg by mouth 2 (two) times daily.     fish oil-omega-3 fatty acids 1000 MG capsule  Take 1 g by mouth daily.     gabapentin 300 MG capsule  Commonly known as:  NEURONTIN  Take 300 mg by mouth at bedtime.     glipiZIDE 5 MG tablet  Commonly known as:  GLUCOTROL  Take 5 mg by mouth 2 (two) times daily before a meal.     HYDROcodone-acetaminophen 7.5-325 MG per tablet  Commonly known as:  NORCO  Take 1-2 tablets by mouth every 4 (four) hours as needed for moderate pain (maximum of 8 per day.).     INVOKANA 300 MG Tabs  Generic drug:  Canagliflozin  Take 300 mg by mouth every morning.     losartan 100 MG tablet  Commonly known as:  COZAAR  Take 100 mg by mouth every morning.     metFORMIN 1000 MG tablet  Commonly known as:  GLUCOPHAGE  Take 1,000 mg by mouth 2 (two) times daily with a meal.      methocarbamol 500 MG tablet  Commonly known as:  ROBAXIN  Take 1 tablet (500 mg total) by mouth every 6 (six) hours as needed for muscle spasms.     multivitamins ther. w/minerals Tabs tablet  Take 1 tablet by mouth daily.     vitamin B-12 100 MCG tablet  Commonly known as:  CYANOCOBALAMIN  Take 100 mcg by mouth daily.        Diagnostic Studies: Dg Chest 2 View  05/11/2013   CLINICAL DATA:  Preoperative for total hip replacement. Hypertension. Diabetic.  EXAM: CHEST  2 VIEW  COMPARISON:  September 25, 2011  FINDINGS: The heart size and mediastinal contours are within normal limits. Both lungs are clear. The visualized skeletal structures are stable. Prior fixation of left clavicle is unchanged.  IMPRESSION: No active cardiopulmonary disease.   Electronically Signed   By: Sherian Rein M.D.   On: 05/11/2013 15:18   Dg Hip Complete Right  05/15/2013   CLINICAL DATA:  Status post total hip arthroplasty. History of osteoarthritis.  EXAM: RIGHT HIP - COMPLETE 2+ VIEW  COMPARISON:  04/29/2013 MRI 9  FINDINGS: The patient has undergone right total hip arthroplasty. The hip appears located on the frontal view. No evidence for acute fracture.  IMPRESSION: Status post total hip arthroplasty.  No adverse features.   Electronically Signed   By: Rosalie Gums M.D.   On: 05/15/2013 16:55   Ct Angio Chest Pe W/cm &/or Wo Cm  05/17/2013   CLINICAL DATA:  Sudden onset tachycardia and hypoxemia. Recent surgery.  EXAM: CT ANGIOGRAPHY CHEST WITH CONTRAST  TECHNIQUE: Multidetector CT imaging of the chest was performed using the standard protocol during bolus administration of intravenous contrast. Multiplanar CT image reconstructions including MIPs were obtained to evaluate the vascular anatomy.  CONTRAST:  OMNIPAQUE IOHEXOL 350 MG/ML SOLN  COMPARISON:  Chest radiograph same date  FINDINGS: Bovine arch type configuration, a normal variant. Suboptimal contrast bolus opacification. The study is adequate for  evaluation for pulmonary embolism up to and including both main pulmonary arteries but is decreased in sensitivity for more distal evaluation. No central focal filling defect is seen to suggest large pulmonary embolism. Heart size  is normal. No pericardial or pleural effusion. No lymphadenopathy.  Left clavicular fusion sideplate and screws partly visualized. Subpleural dependent areas of patchy and curvilinear atelectasis are noted. No focal consolidation, mass, or nodule. Central airways are patent.  No acute osseous abnormality.  Review of the MIP images confirms the above findings.  IMPRESSION: No CT evidence for acute pulmonary embolism up to and involving either main pulmonary artery.  Dependent bibasilar patchy and curvilinear atelectasis reidentified. Very early pneumonia could appear similar but is less likely given the clinical presentation and radiographic appearance.   Electronically Signed   By: Christiana Pellant M.D.   On: 05/17/2013 15:39   Dg Pelvis Portable  05/15/2013   CLINICAL DATA:  Postop right hip arthroplasty  EXAM: PORTABLE PELVIS 1-2 VIEWS  COMPARISON:  Operative image, 05/15/2013  FINDINGS: Single view of the right hip prosthesis shows the acetabular and prosthetic components to be well-seated in well aligned. There is no acute fracture or evidence of an operative complication.  IMPRESSION: Right hip arthroplasty is well seated and aligned.   Electronically Signed   By: Amie Portland M.D.   On: 05/15/2013 17:42   Dg Chest Port 1 View  05/17/2013   CLINICAL DATA:  Shortness of breath, abdominal pain  EXAM: PORTABLE CHEST - 1 VIEW  COMPARISON:  05/11/2013  FINDINGS: Lung volumes are extremely low with curvilinear lower lobe atelectasis bilaterally. No pleural effusion. Heart size upper limits of normal. Left clavicular side plate and screw fixation noted.  IMPRESSION: Low lung volumes bilaterally with bibasilar platelike atelectasis.   Electronically Signed   By: Christiana Pellant M.D.    On: 05/17/2013 13:56   Dg Hip Portable 1 View Right  05/15/2013   CLINICAL DATA:  Postop right hip arthroplasty  EXAM: PORTABLE RIGHT HIP - 1 VIEW  COMPARISON:  Operative view, 05/15/2013  FINDINGS: Cross-table lateral view of the right hip prosthesis demonstrates the femoral and acetabular components to be well seated and aligned. No acute fracture or evidence of an operative complication.  IMPRESSION: New right hip prosthesis is well-seated and well-aligned.   Electronically Signed   By: Amie Portland M.D.   On: 05/15/2013 17:42   Dg Abd 2 Views  05/17/2013   CLINICAL DATA:  Abdominal pain and distention for 1 day  EXAM: ABDOMEN - 2 VIEW  COMPARISON:  None.  FINDINGS: Diffuse prominence of multiple loops of bowel without differential air-fluid levels. No decubitus evidence for free air. No abnormal calcific opacity. Radiodense material projecting over the bladder likely represents contrast from recent separate examination. Right hip prosthesis partly visualized.  IMPRESSION: Diffuse prominence of multiple loops of bowel with a pattern most typical for ileus.   Electronically Signed   By: Christiana Pellant M.D.   On: 05/17/2013 16:03   Dg Abd Portable 2v  05/18/2013   CLINICAL DATA:  Ileus  EXAM: PORTABLE ABDOMEN - 2 VIEW  COMPARISON:  Radiography from yesterday  FINDINGS: Persistent gaseous dilation of the colon diffusely (note the rectum is not included on this examination). Maximal sigmoid diameter is slightly increased from prior, now 8 cm - previously 7.5 cm. No evidence of pneumatosis or pneumoperitoneum. No evidence of small bowel obstruction.  IMPRESSION: Similar degree of diffuse colonic distention - again favoring ileus.   Electronically Signed   By: Tiburcio Pea M.D.   On: 05/18/2013 05:48   Dg C-arm 1-60 Min-no Report  05/15/2013   CLINICAL DATA: right anterior hip replacement   C-ARM 1-60 MINUTES  Fluoroscopy was utilized by the requesting physician.  No radiographic  interpretation.      Disposition: 01-Home or Self Care      Discharge Orders   Future Orders Complete By Expires   Discharge wound care:  As directed    Comments:     Keep dressing clean and intact. May shower with dressing intact . Remove dressing Thursday and shower. After showering apply clean dressing.   Weight bearing as tolerated  As directed    Questions:     Laterality:     Extremity:        Follow-up Information   Follow up with [facility])    Contact information:   [address] [place] [address] [phone]       Follow up with Kathryne Hitch, MD In 2 weeks.   Specialty:  Orthopedic Surgery   Contact information:   [address] [place] [address] [phone]        Signed: Richardean Canal 05/19/2013, 12:58 PM

## 2014-04-14 IMAGING — CR DG HIP 1V PORT*R*
1 series · 1 of 1 positions shown · non-contrast
Comparison: Operative view, 05/15/2013

CLINICAL DATA: Postop right hip arthroplasty

EXAM:
PORTABLE RIGHT HIP - 1 VIEW

[AP]
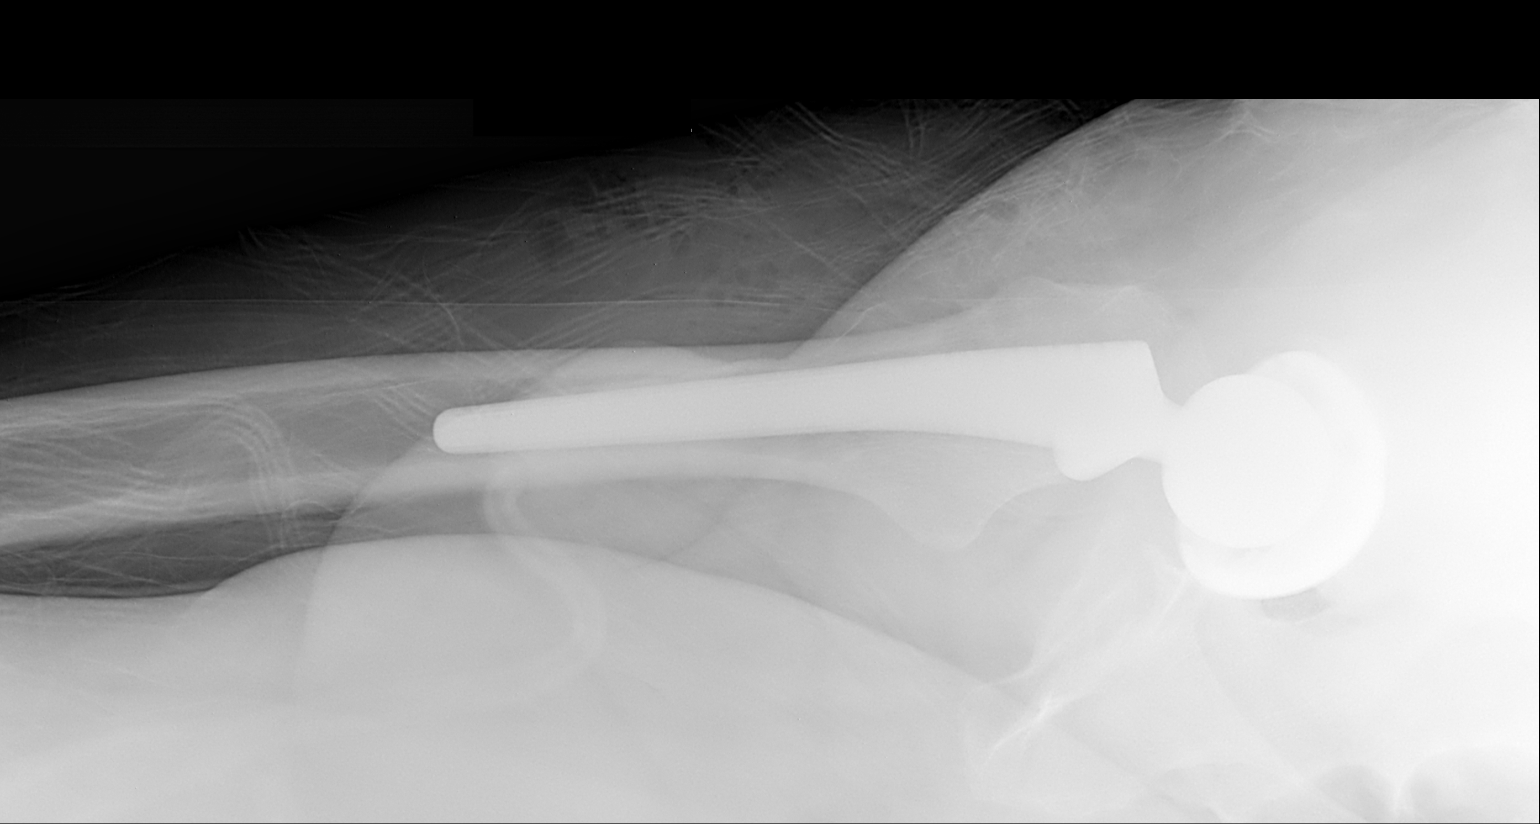

[1 of 1 positions shown; findings below may reference images not displayed]

FINDINGS: Cross-table lateral view of the right hip prosthesis demonstrates
the femoral and acetabular components to be well seated and aligned.
No acute fracture or evidence of an operative complication.
IMPRESSION: New right hip prosthesis is well-seated and well-aligned.

## 2014-04-14 IMAGING — CR DG PORTABLE PELVIS
1 series · 1 of 1 positions shown · non-contrast
Comparison: Operative image, 05/15/2013

CLINICAL DATA: Postop right hip arthroplasty

EXAM:
PORTABLE PELVIS 1-2 VIEWS

[AP]
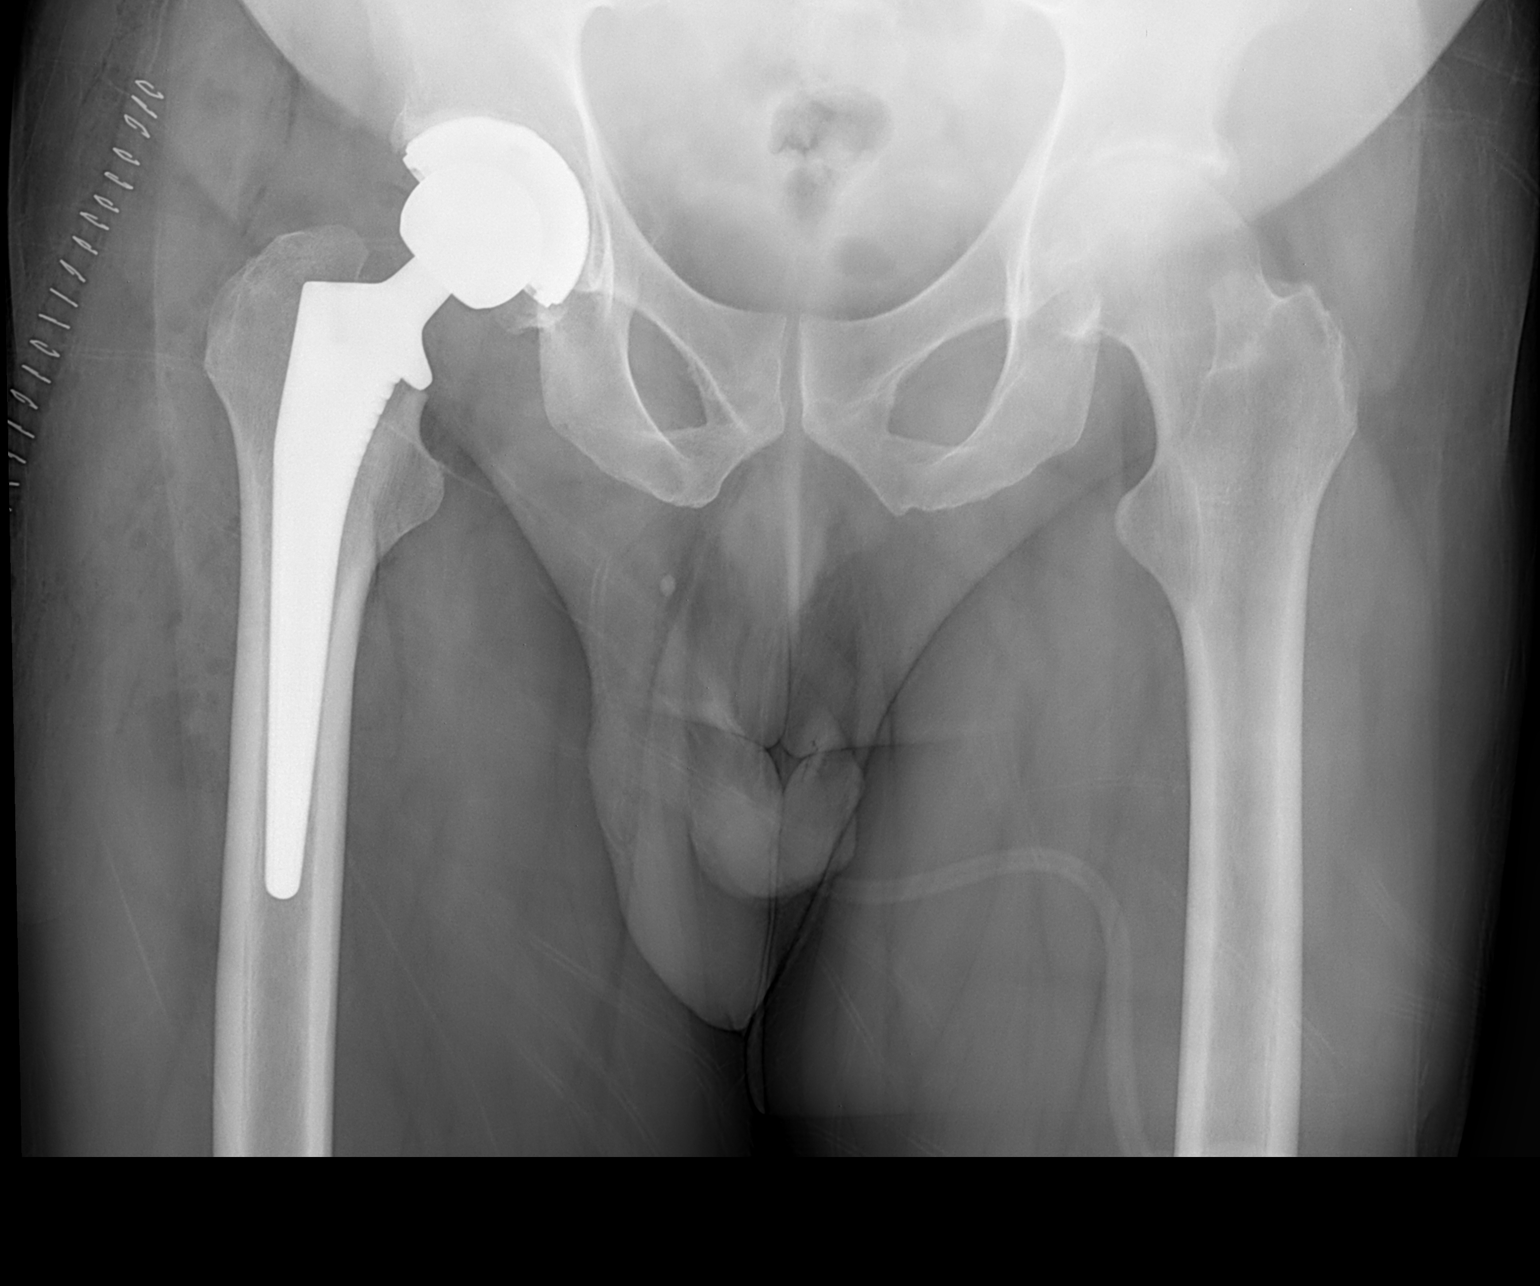

[1 of 1 positions shown; findings below may reference images not displayed]

FINDINGS: Single view of the right hip prosthesis shows the acetabular and
prosthetic components to be well-seated in well aligned. There is no
acute fracture or evidence of an operative complication.
IMPRESSION: Right hip arthroplasty is well seated and aligned.

## 2014-04-14 IMAGING — RF DG HIP COMPLETE 2+V*R*
1 series · 2 of 2 positions shown · non-contrast
Comparison: 04/29/2013 MRI 9

CLINICAL DATA: Status post total hip arthroplasty. History of
osteoarthritis.

EXAM:
RIGHT HIP - COMPLETE 2+ VIEW

[Series 1: run · 2 of 2 slices shown]
[im 1/2]
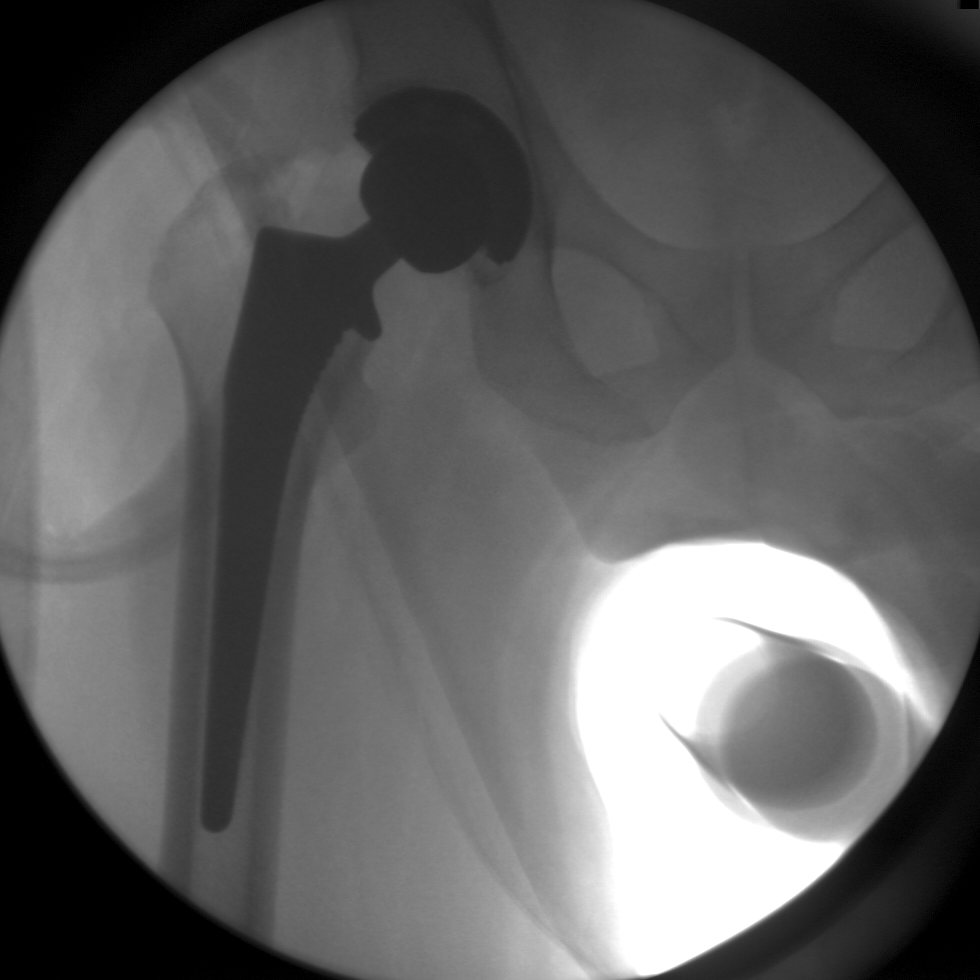
[im 2/2]
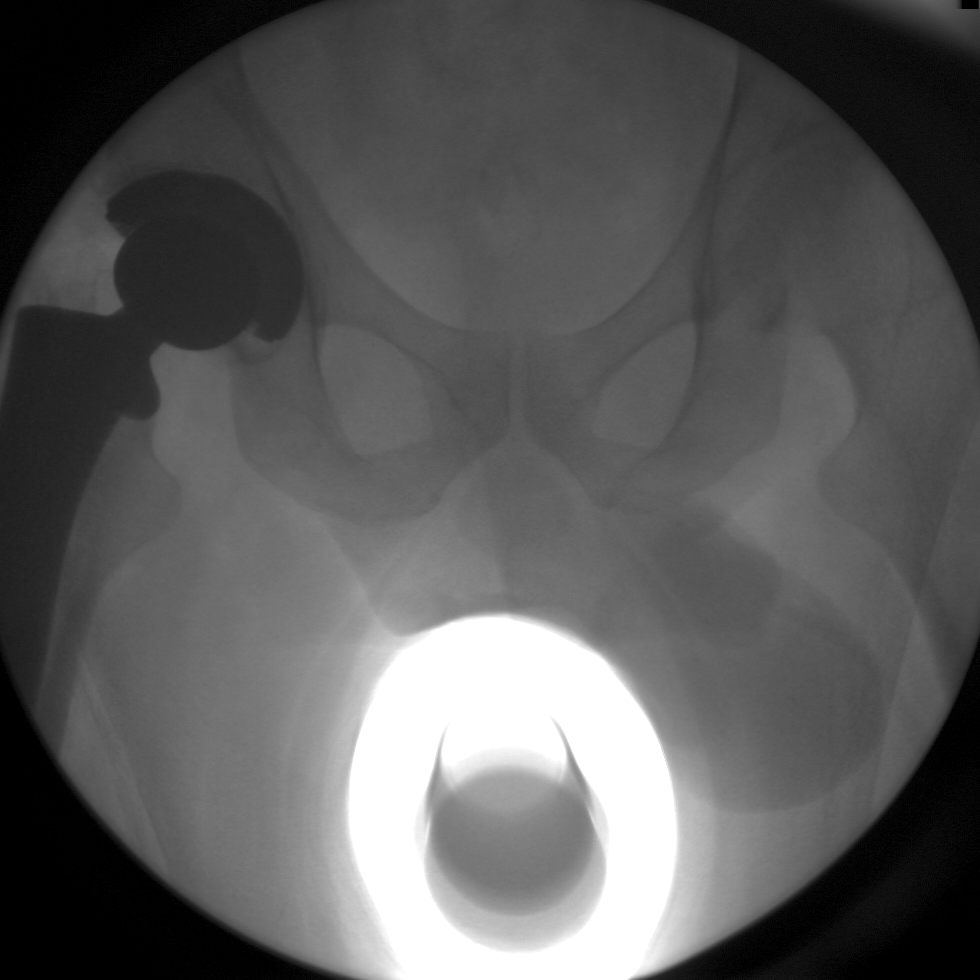

[2 of 2 positions shown; findings below may reference images not displayed]

FINDINGS: The patient has undergone right total hip arthroplasty. The hip
appears located on the frontal view. No evidence for acute fracture.
IMPRESSION: Status post total hip arthroplasty.  No adverse features.

## 2014-04-16 IMAGING — CR DG CHEST 1V PORT
1 series · 1 of 1 positions shown · non-contrast
Comparison: 05/11/2013

CLINICAL DATA: Shortness of breath, abdominal pain

EXAM:
PORTABLE CHEST - 1 VIEW

[AP]
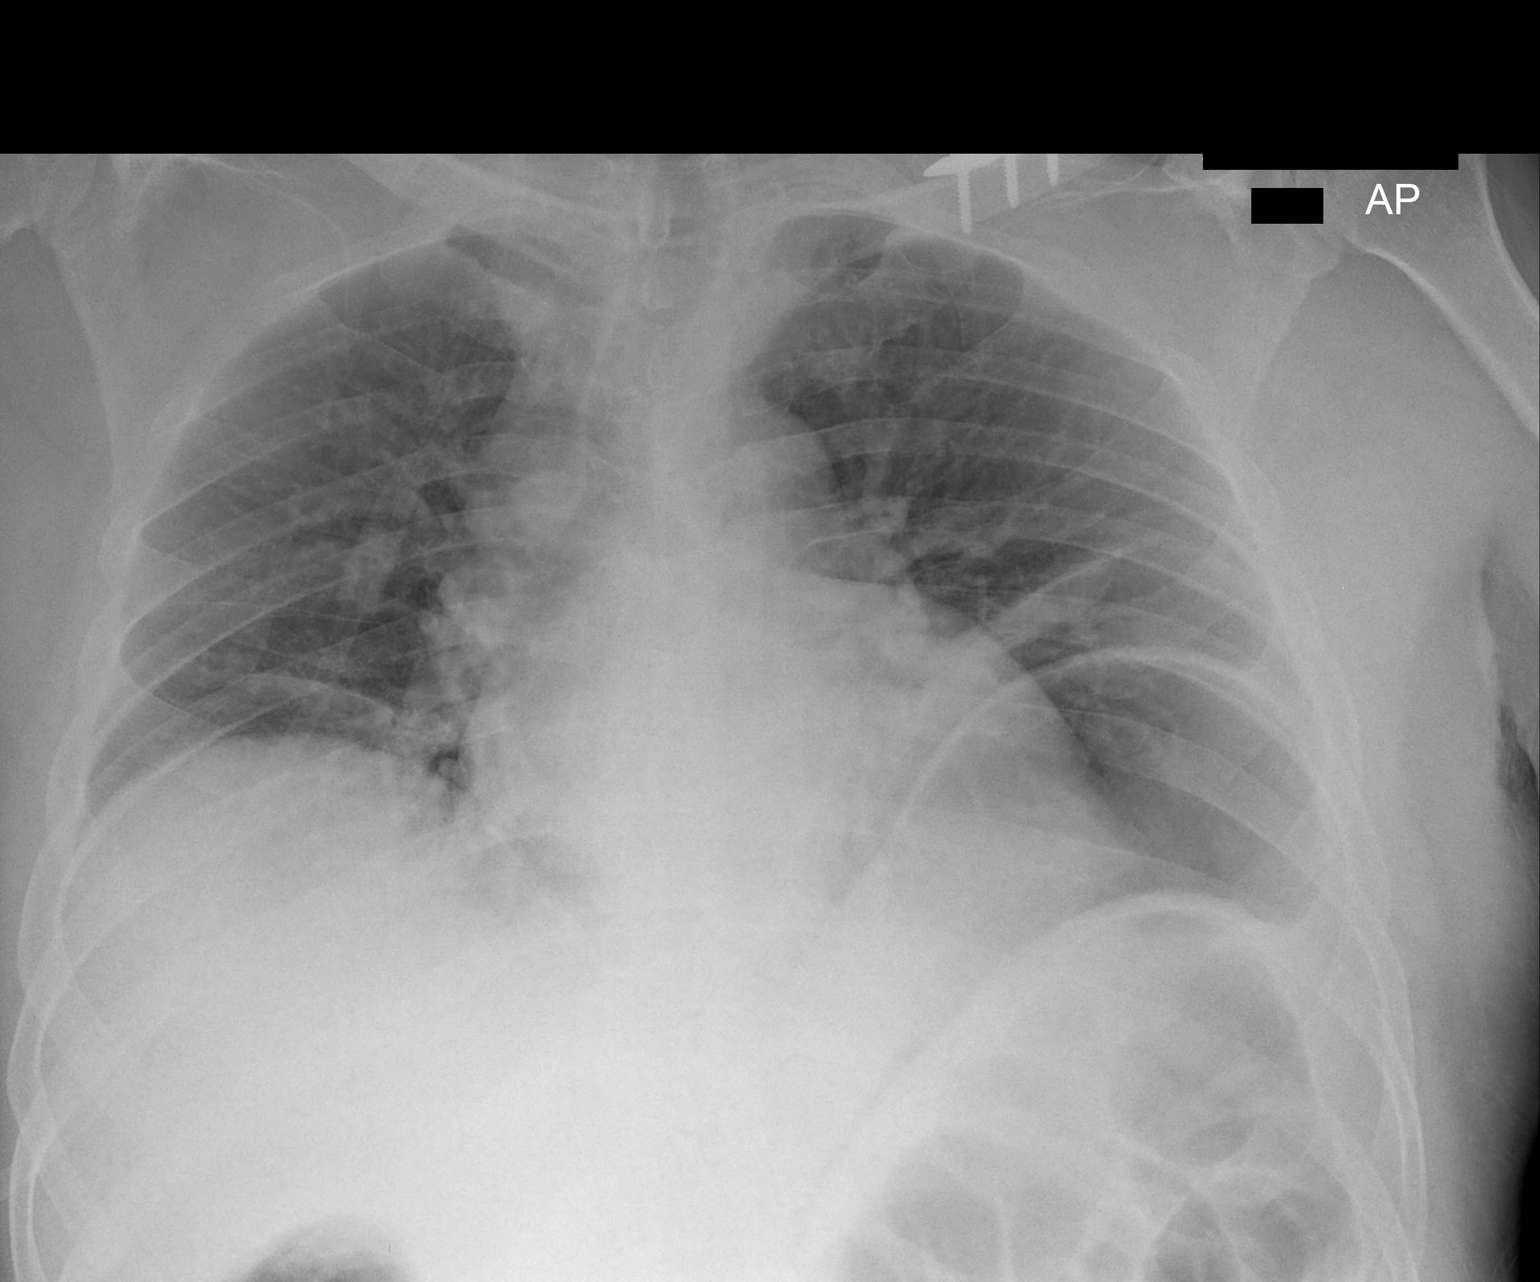

[1 of 1 positions shown; findings below may reference images not displayed]

FINDINGS: Lung volumes are extremely low with curvilinear lower lobe
atelectasis bilaterally. No pleural effusion. Heart size upper
limits of normal. Left clavicular side plate and screw fixation
noted.
IMPRESSION: Low lung volumes bilaterally with bibasilar platelike atelectasis.

## 2014-04-16 IMAGING — CR DG ABDOMEN 2V
3 series · 3 of 3 positions shown · non-contrast
Comparison: None.

CLINICAL DATA: Abdominal pain and distention for 1 day

EXAM:
ABDOMEN - 2 VIEW

[w abdomen decub *]
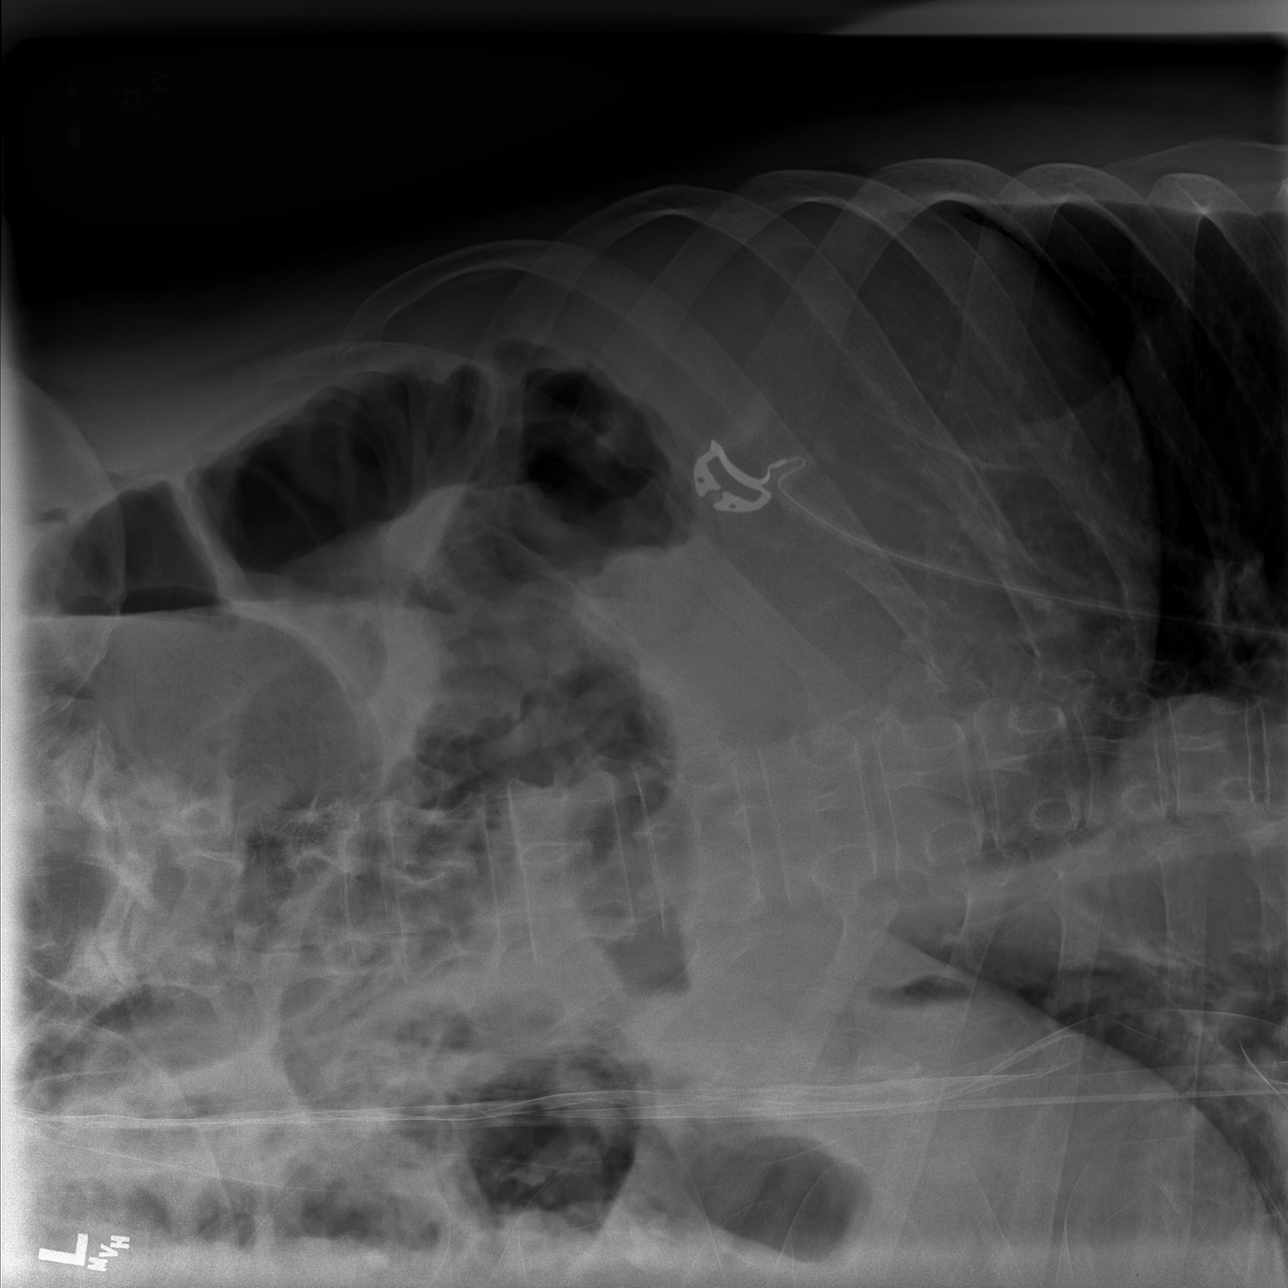

[view not recorded (1 of 2)]
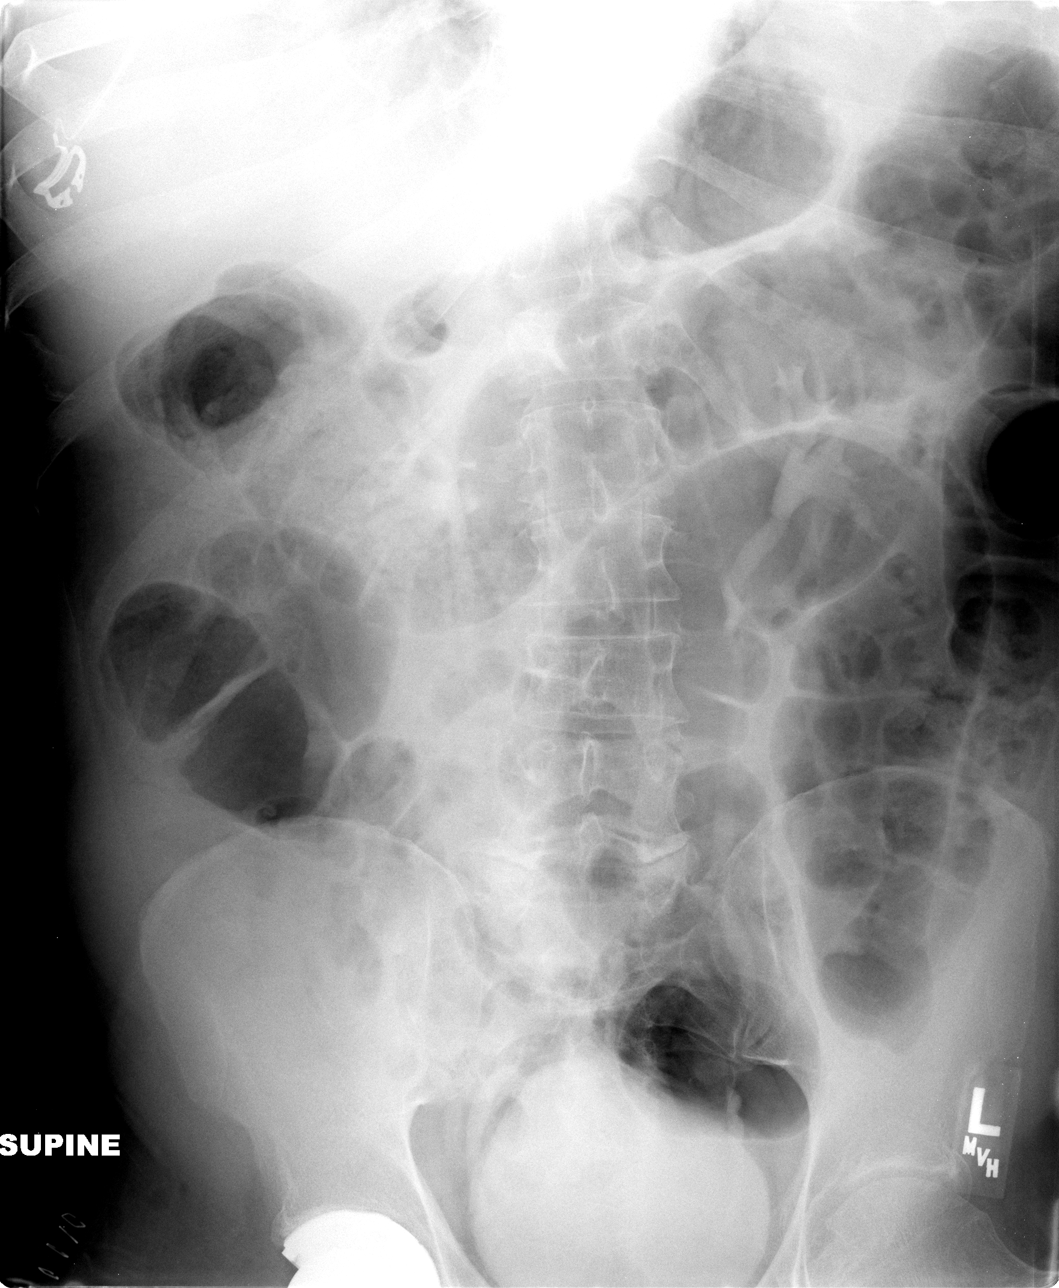

[view not recorded (2 of 2)]
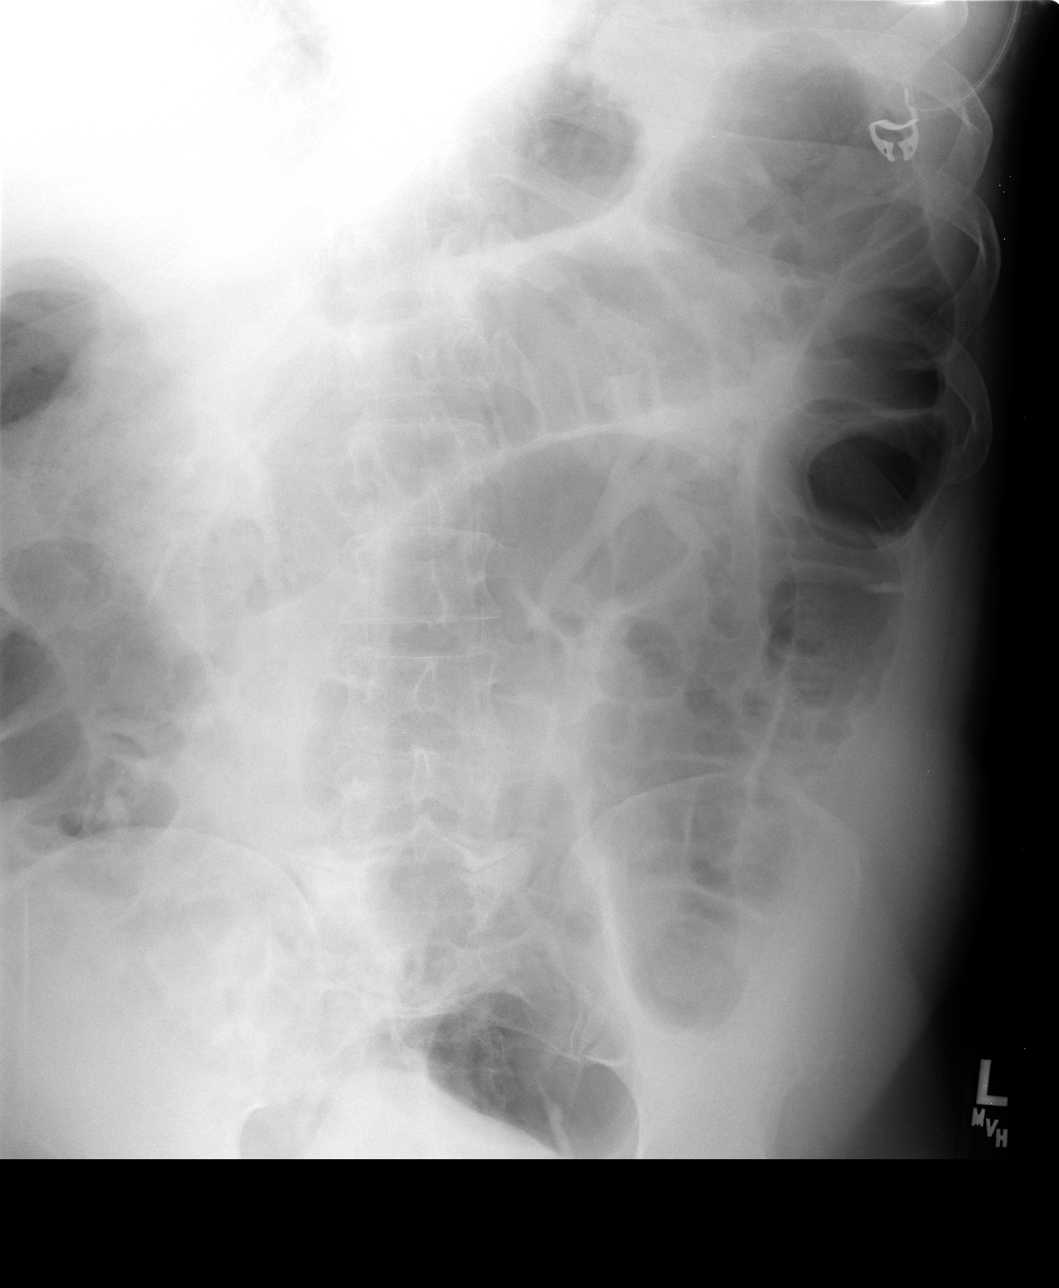

[3 of 3 positions shown; findings below may reference images not displayed]

FINDINGS: Diffuse prominence of multiple loops of bowel without differential
air-fluid levels. No decubitus evidence for free air. No abnormal
calcific opacity. Radiodense material projecting over the bladder
likely represents contrast from recent separate examination. Right
hip prosthesis partly visualized.
IMPRESSION: Diffuse prominence of multiple loops of bowel with a pattern most
typical for ileus.

## 2014-04-16 IMAGING — CT CT ANGIO CHEST
2 of 6 series · 19 of 36 positions shown · IV contrast (OMNIPAQUE)
Comparison: Chest radiograph same date

CLINICAL DATA: Sudden onset tachycardia and hypoxemia. Recent
surgery.

EXAM:
CT ANGIOGRAPHY CHEST WITH CONTRAST
TECHNIQUE: Multidetector CT imaging of the chest was performed using the
standard protocol during bolus administration of intravenous
contrast. Multiplanar CT image reconstructions including MIPs were
obtained to evaluate the vascular anatomy.
CONTRAST:  100mL OMNIPAQUE IOHEXOL 350 MG/ML SOLN

[Series 7: pe thins @ 1mm · axial · 0.76mm/px · z∈[-242,-52]mm · 18 of 212 slices shown]
[im 11/212  lung]
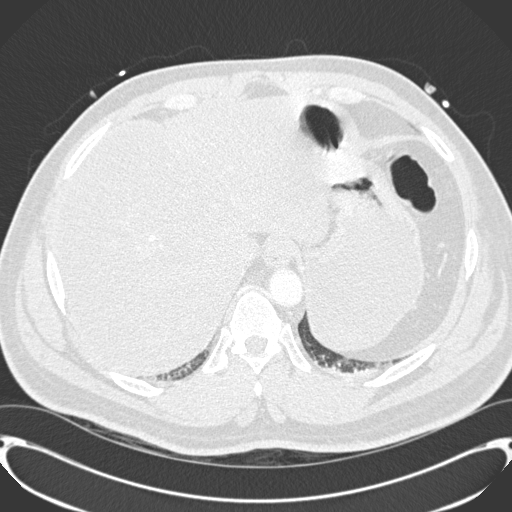
[im 22/212  mediastinal]
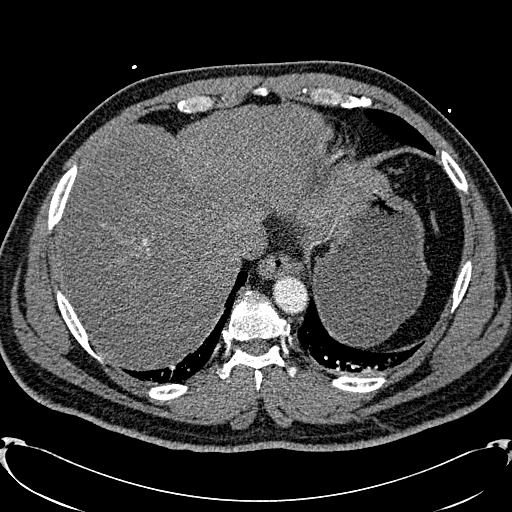
[im 32/212  lung]
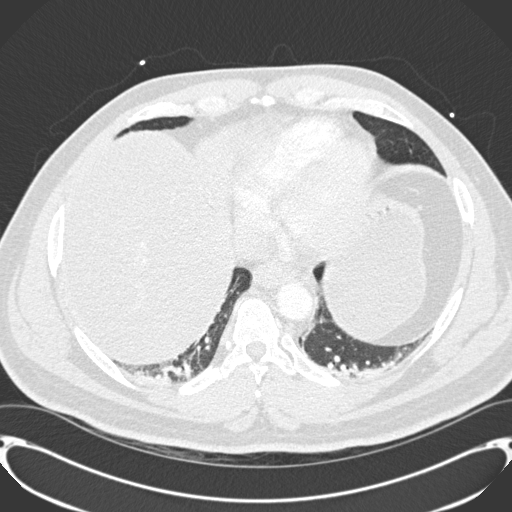
[im 43/212  mediastinal]
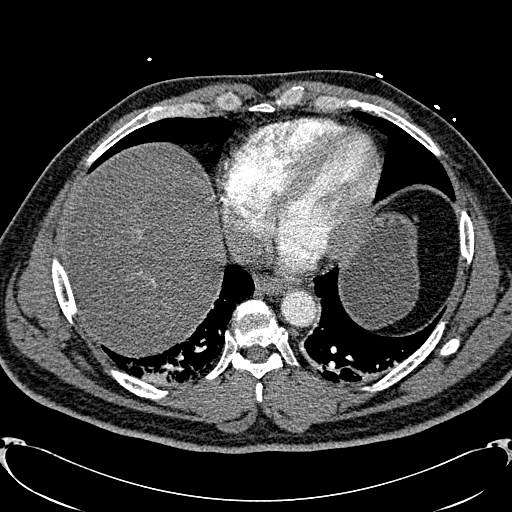
[im 53/212  lung]
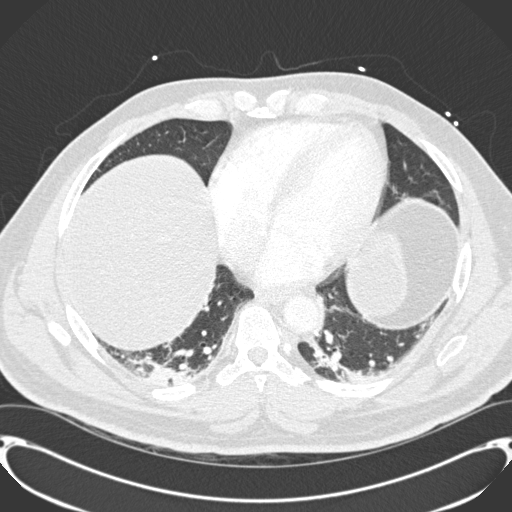
[im 64/212  mediastinal]
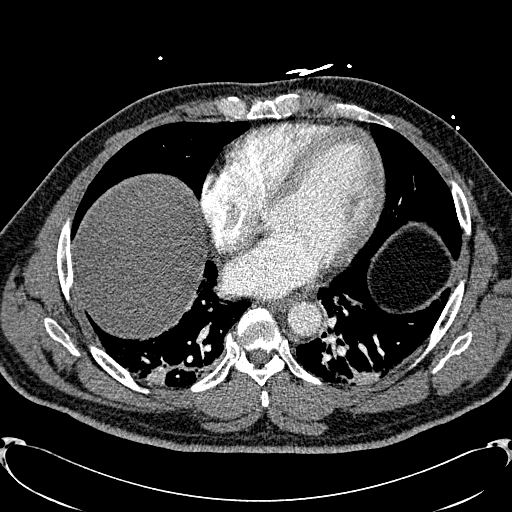
[im 74/212  lung]
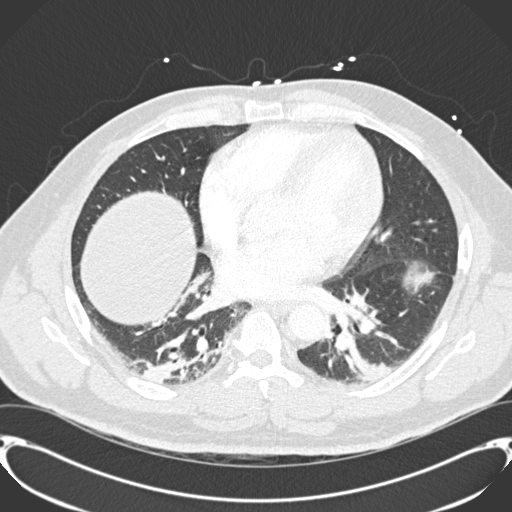
[im 85/212  mediastinal]
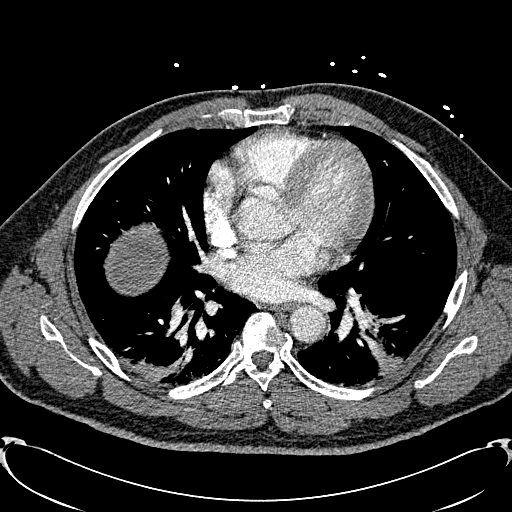
[im 95/212  lung]
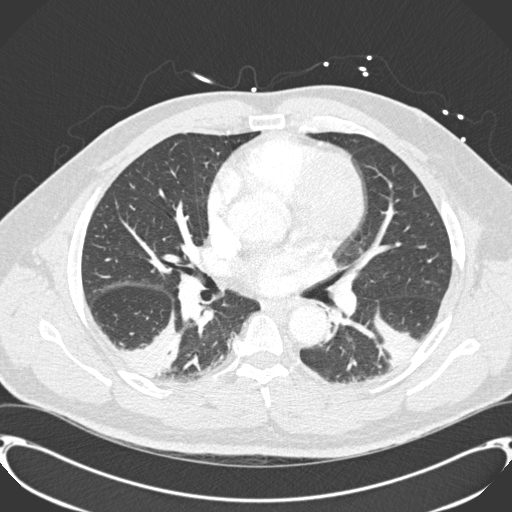
[im 117/212  mediastinal]
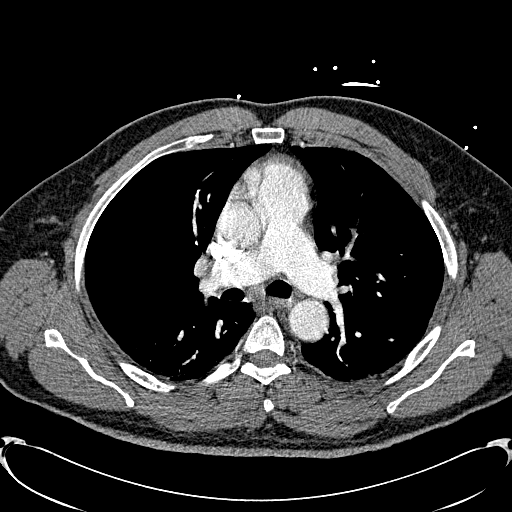
[im 127/212  lung]
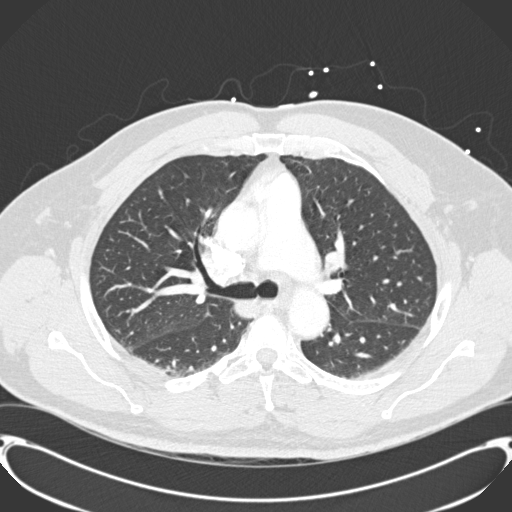
[im 138/212  mediastinal]
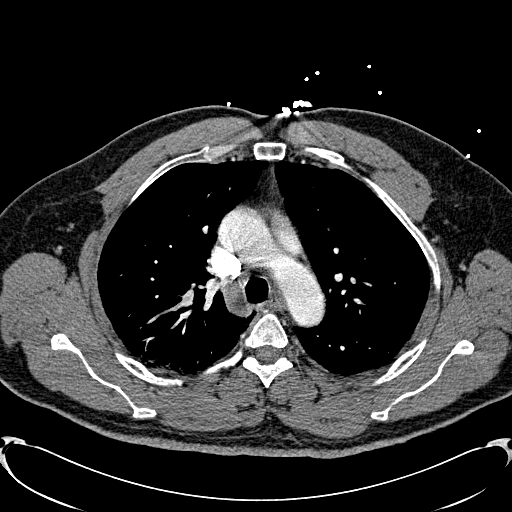
[im 148/212  lung]
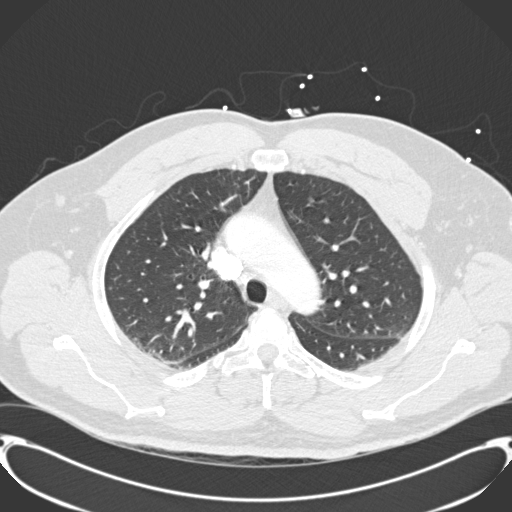
[im 159/212  mediastinal]
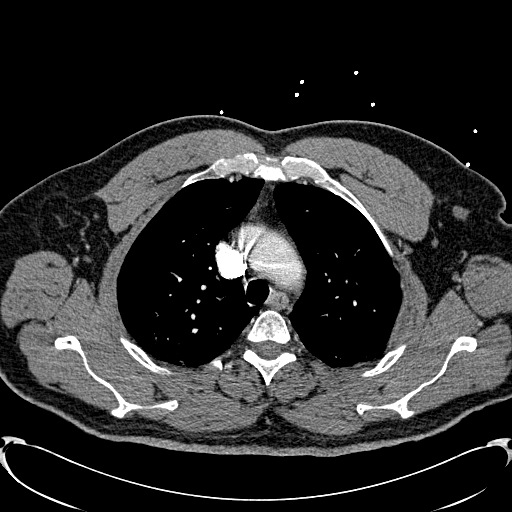
[im 169/212  lung]
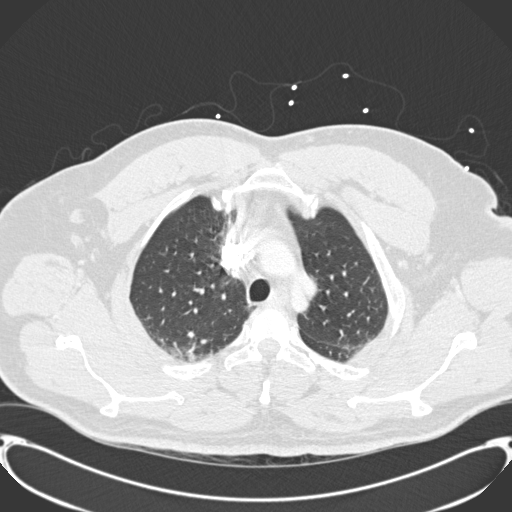
[im 180/212  mediastinal]
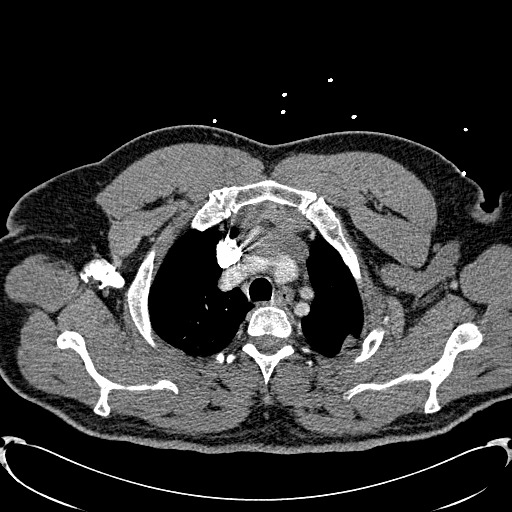
[im 190/212  lung]
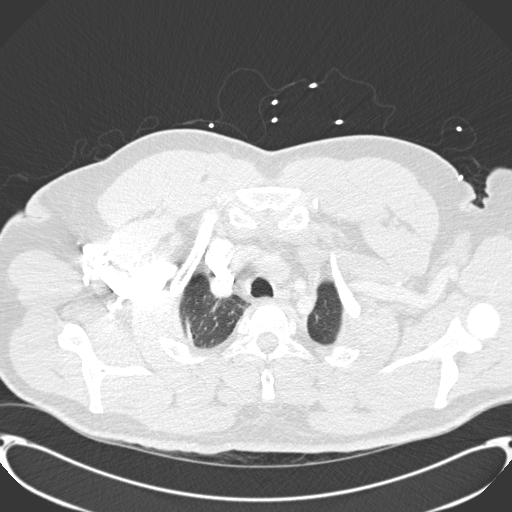
[im 201/212  mediastinal]
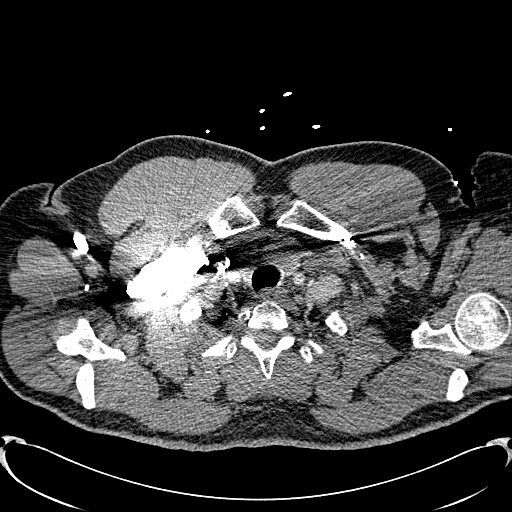

[Series 604: coronal mpr · coronal · 0.76mm/px · 1 of 129 slices shown]
[im 65/129  mediastinal]
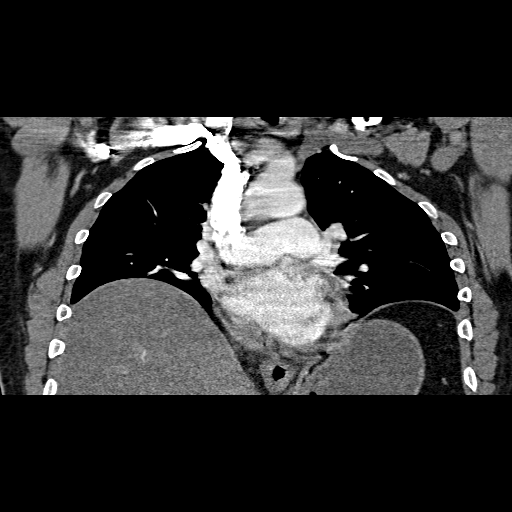

[19 of 36 positions shown; findings below may reference images not displayed]

FINDINGS: Bovine arch type configuration, a normal variant. Suboptimal
contrast bolus opacification. The study is adequate for evaluation
for pulmonary embolism up to and including both main pulmonary
arteries but is decreased in sensitivity for more distal evaluation.
No central focal filling defect is seen to suggest large pulmonary
embolism. Heart size is normal. No pericardial or pleural effusion.
No lymphadenopathy.

Left clavicular fusion sideplate and screws partly visualized.
Subpleural dependent areas of patchy and curvilinear atelectasis are
noted. No focal consolidation, mass, or nodule. Central airways are
patent.

No acute osseous abnormality.

Review of the MIP images confirms the above findings.
IMPRESSION: No CT evidence for acute pulmonary embolism up to and involving
either main pulmonary artery.

Dependent bibasilar patchy and curvilinear atelectasis reidentified.
Very early pneumonia could appear similar but is less likely given
the clinical presentation and radiographic appearance.

## 2014-04-17 IMAGING — CR DG ABD PORTABLE 2V
1 series · 4 of 4 positions shown · non-contrast
Comparison: Radiography from yesterday

CLINICAL DATA: Ileus

EXAM:
PORTABLE ABDOMEN - 2 VIEW

[Series 1: AP · U · 4 of 4 slices shown]
[im 1/4]
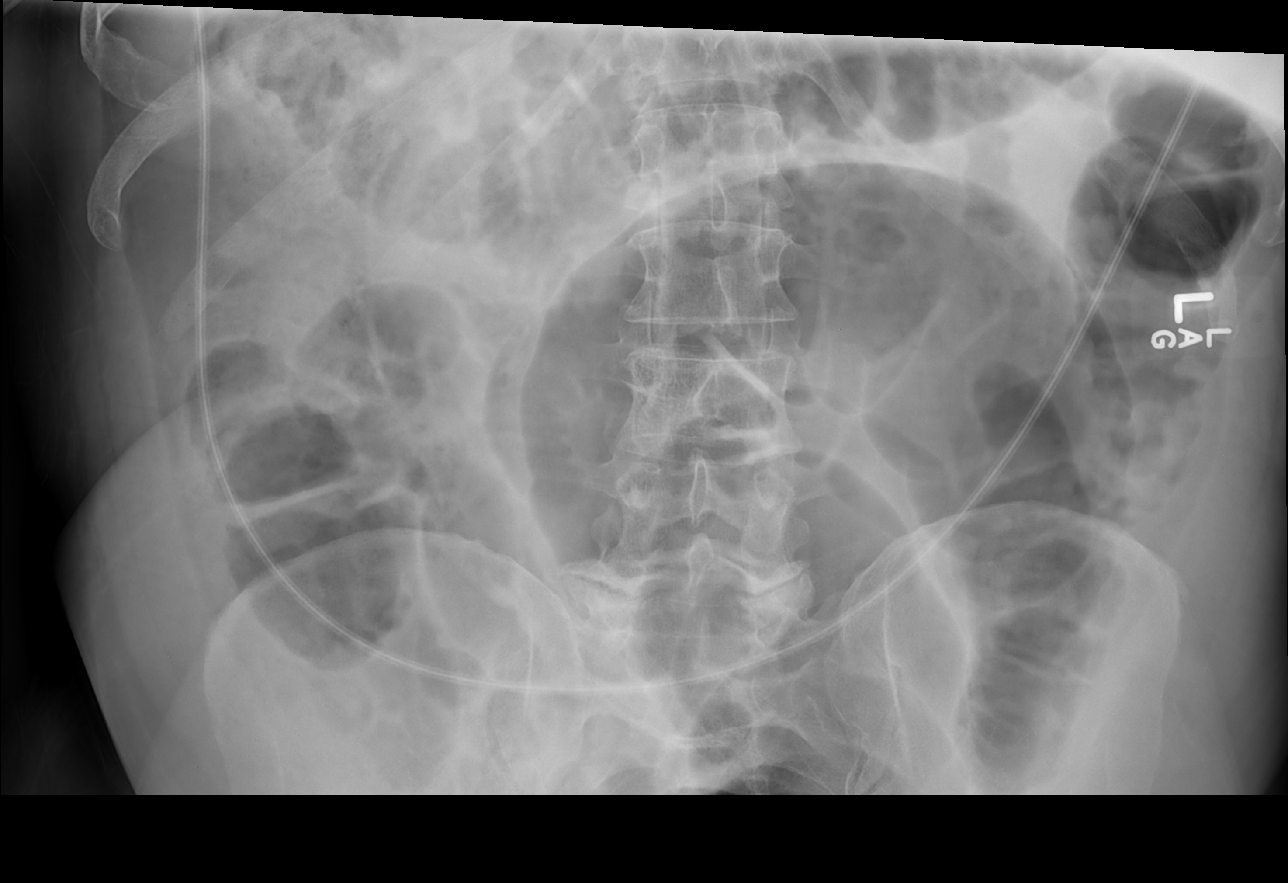
[im 2/4]
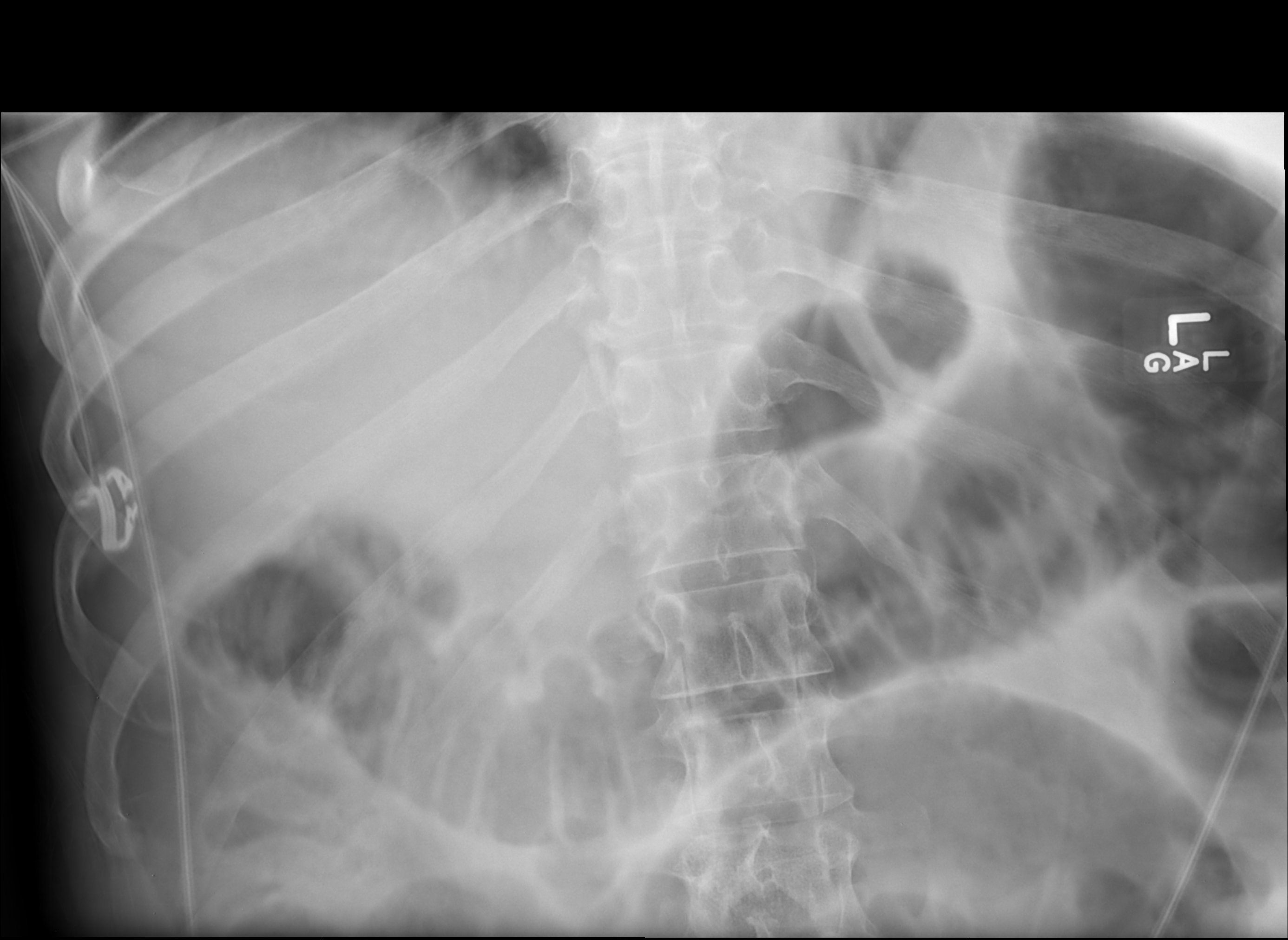
[im 3/4]
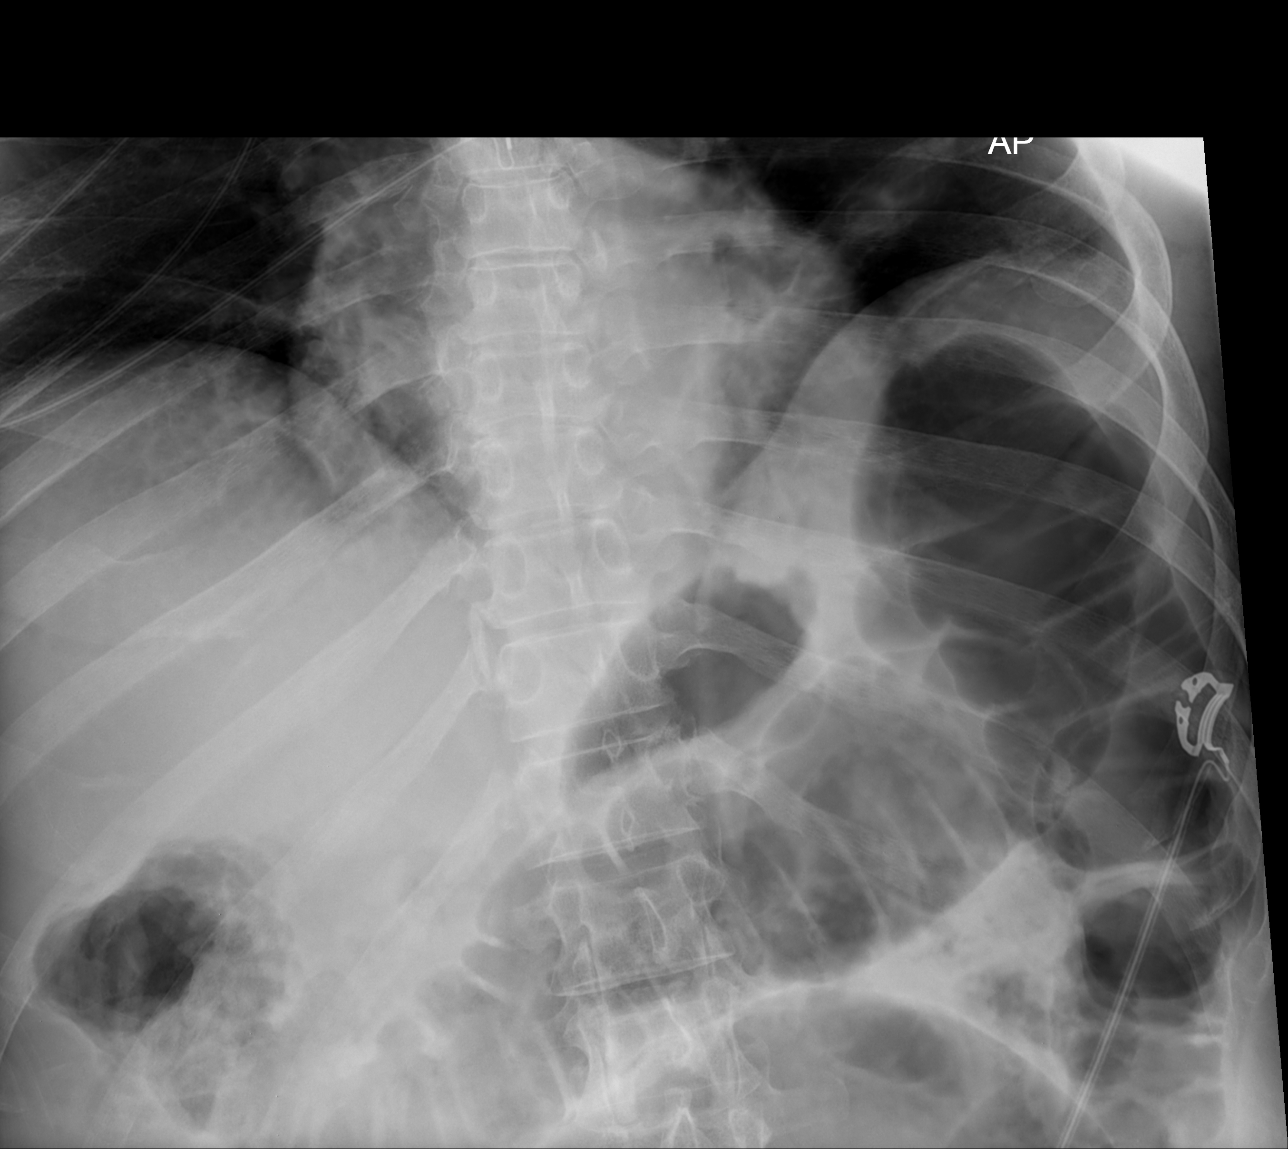
[im 4/4]
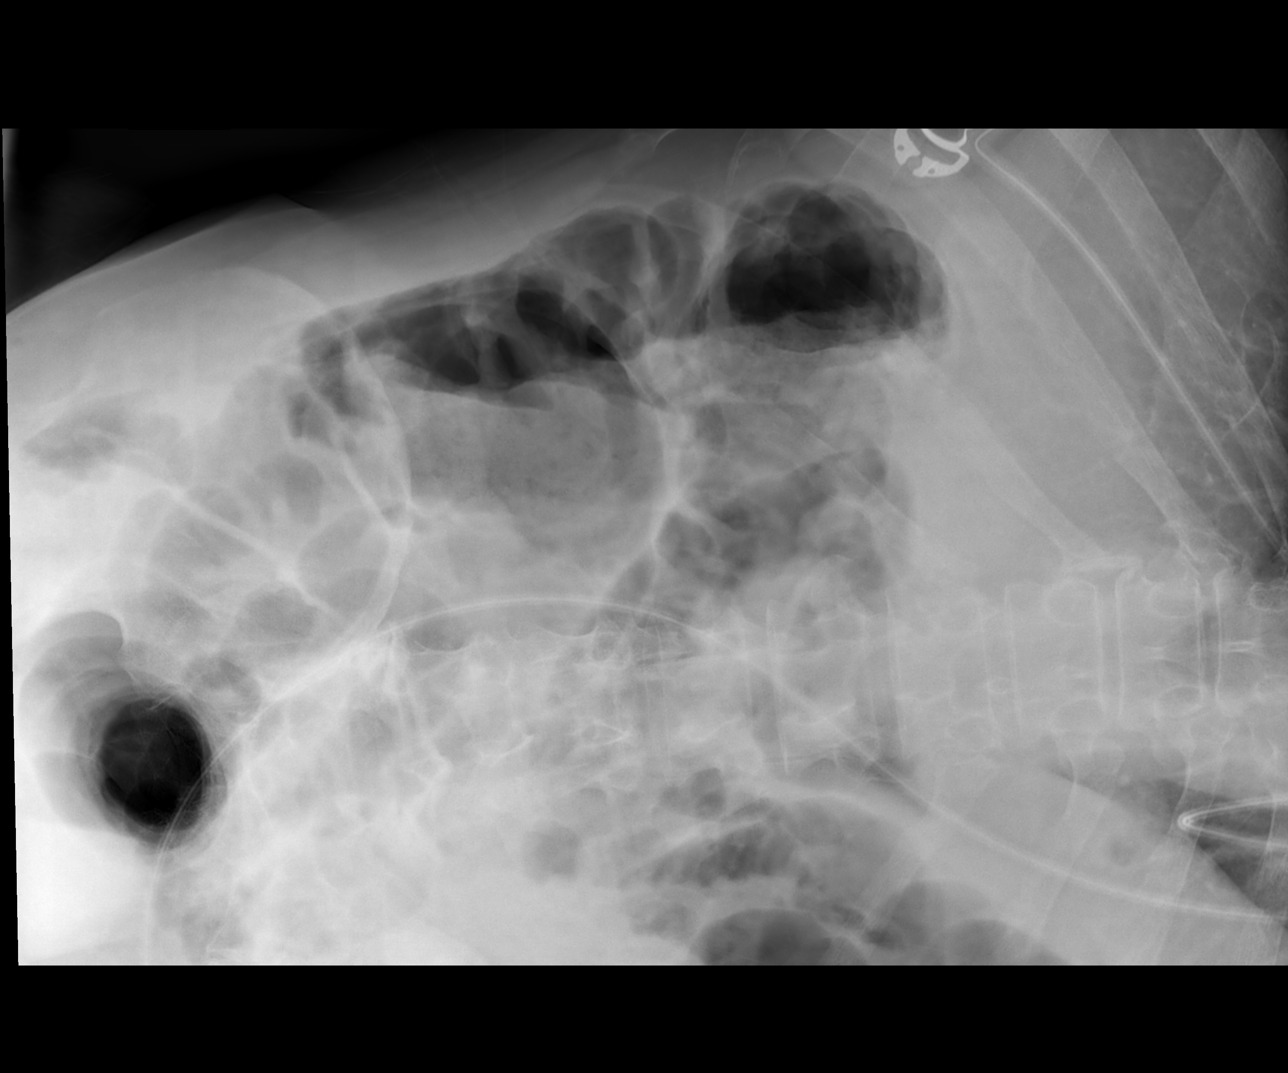

[4 of 4 positions shown; findings below may reference images not displayed]

FINDINGS: Persistent gaseous dilation of the colon diffusely (note the rectum
is not included on this examination). Maximal sigmoid diameter is
slightly increased from prior, now 8 cm - previously 7.5 cm. No
evidence of pneumatosis or pneumoperitoneum. No evidence of small
bowel obstruction.
IMPRESSION: Similar degree of diffuse colonic distention - again favoring ileus.

## 2014-08-17 ENCOUNTER — Encounter (HOSPITAL_COMMUNITY): Payer: Self-pay | Admitting: Emergency Medicine

## 2014-08-17 ENCOUNTER — Emergency Department (HOSPITAL_COMMUNITY): Payer: 59

## 2014-08-17 ENCOUNTER — Emergency Department (HOSPITAL_COMMUNITY)
Admission: EM | Admit: 2014-08-17 | Discharge: 2014-08-17 | Disposition: A | Discharge status: Home / Self Care | Payer: 59 | Attending: Emergency Medicine | Admitting: Emergency Medicine

## 2014-08-17 DIAGNOSIS — Z720 Tobacco use: Secondary | ICD-10-CM | POA: Insufficient documentation

## 2014-08-17 DIAGNOSIS — M1990 Unspecified osteoarthritis, unspecified site: Secondary | ICD-10-CM | POA: Insufficient documentation

## 2014-08-17 DIAGNOSIS — I10 Essential (primary) hypertension: Secondary | ICD-10-CM | POA: Insufficient documentation

## 2014-08-17 DIAGNOSIS — E119 Type 2 diabetes mellitus without complications: Secondary | ICD-10-CM | POA: Insufficient documentation

## 2014-08-17 DIAGNOSIS — R002 Palpitations: Secondary | ICD-10-CM | POA: Diagnosis present

## 2014-08-17 DIAGNOSIS — I493 Ventricular premature depolarization: Secondary | ICD-10-CM | POA: Diagnosis not present

## 2014-08-17 DIAGNOSIS — R42 Dizziness and giddiness: Secondary | ICD-10-CM | POA: Diagnosis not present

## 2014-08-17 DIAGNOSIS — R197 Diarrhea, unspecified: Secondary | ICD-10-CM | POA: Insufficient documentation

## 2014-08-17 DIAGNOSIS — Z7982 Long term (current) use of aspirin: Secondary | ICD-10-CM | POA: Insufficient documentation

## 2014-08-17 DIAGNOSIS — Z79899 Other long term (current) drug therapy: Secondary | ICD-10-CM | POA: Insufficient documentation

## 2014-08-17 LAB — CBC
HCT: 39.9 % (ref 39.0–52.0)
Hemoglobin: 13.4 g/dL (ref 13.0–17.0)
MCH: 28.6 pg (ref 26.0–34.0)
MCHC: 33.6 g/dL (ref 30.0–36.0)
MCV: 85.1 fL (ref 78.0–100.0)
Platelets: 177 10*3/uL (ref 150–400)
RBC: 4.69 MIL/uL (ref 4.22–5.81)
RDW: 11.9 % (ref 11.5–15.5)
WBC: 3.2 10*3/uL — ABNORMAL LOW (ref 4.0–10.5)

## 2014-08-17 LAB — COMPREHENSIVE METABOLIC PANEL
ALK PHOS: 67 U/L (ref 39–117)
ALT: 27 U/L (ref 0–53)
AST: 22 U/L (ref 0–37)
Albumin: 4 g/dL (ref 3.5–5.2)
Anion gap: 7 (ref 5–15)
BILIRUBIN TOTAL: 0.7 mg/dL (ref 0.3–1.2)
BUN: 11 mg/dL (ref 6–23)
CO2: 26 mmol/L (ref 19–32)
CREATININE: 1.12 mg/dL (ref 0.50–1.35)
Calcium: 9.3 mg/dL (ref 8.4–10.5)
Chloride: 105 mmol/L (ref 96–112)
GFR calc Af Amer: 85 mL/min — ABNORMAL LOW (ref >90)
GFR calc non Af Amer: 73 mL/min — ABNORMAL LOW (ref >90)
Glucose, Bld: 102 mg/dL — ABNORMAL HIGH (ref 70–99)
Potassium: 3.9 mmol/L (ref 3.5–5.1)
Sodium: 138 mmol/L (ref 135–145)
Total Protein: 7.7 g/dL (ref 6.0–8.3)

## 2014-08-17 LAB — I-STAT TROPONIN, ED: TROPONIN I, POC: 0 ng/mL (ref 0.00–0.08)

## 2014-08-17 MED ORDER — SODIUM CHLORIDE 0.9 % IV BOLUS (SEPSIS)
1000.0000 mL | Freq: Once | INTRAVENOUS | Status: AC
  Administered 2014-08-17: 1000 mL via INTRAVENOUS

## 2014-08-17 MED ORDER — ONDANSETRON HCL 4 MG/2ML IJ SOLN
4.0000 mg | Freq: Once | INTRAMUSCULAR | Status: AC
  Administered 2014-08-17: 4 mg via INTRAVENOUS
  Filled 2014-08-17: qty 2

## 2014-08-17 NOTE — Discharge Instructions (Signed)
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
## 2014-08-17 NOTE — ED Notes (Signed)
Pt presents with chest palpitations for the past 2 days- admits to being under increased stress recently.  Pt states that today he has felt lightheaded and nauseated- denies vomiting.  Denies CP at this time, pt alert and oriented X 4 at present.

## 2014-12-05 ENCOUNTER — Encounter (HOSPITAL_COMMUNITY): Payer: Self-pay | Admitting: Emergency Medicine

## 2014-12-05 ENCOUNTER — Emergency Department (HOSPITAL_COMMUNITY): Payer: 59

## 2014-12-05 ENCOUNTER — Emergency Department (HOSPITAL_COMMUNITY)
Admission: EM | Admit: 2014-12-05 | Discharge: 2014-12-05 | Disposition: A | Discharge status: Home / Self Care | Payer: 59 | Attending: Emergency Medicine | Admitting: Emergency Medicine

## 2014-12-05 DIAGNOSIS — I10 Essential (primary) hypertension: Secondary | ICD-10-CM | POA: Diagnosis not present

## 2014-12-05 DIAGNOSIS — N390 Urinary tract infection, site not specified: Secondary | ICD-10-CM

## 2014-12-05 DIAGNOSIS — M1990 Unspecified osteoarthritis, unspecified site: Secondary | ICD-10-CM | POA: Insufficient documentation

## 2014-12-05 DIAGNOSIS — Z7982 Long term (current) use of aspirin: Secondary | ICD-10-CM | POA: Insufficient documentation

## 2014-12-05 DIAGNOSIS — Z720 Tobacco use: Secondary | ICD-10-CM | POA: Insufficient documentation

## 2014-12-05 DIAGNOSIS — Z9114 Patient's other noncompliance with medication regimen: Secondary | ICD-10-CM

## 2014-12-05 DIAGNOSIS — R05 Cough: Secondary | ICD-10-CM | POA: Diagnosis not present

## 2014-12-05 DIAGNOSIS — R5383 Other fatigue: Secondary | ICD-10-CM

## 2014-12-05 DIAGNOSIS — Z79899 Other long term (current) drug therapy: Secondary | ICD-10-CM | POA: Diagnosis not present

## 2014-12-05 DIAGNOSIS — J3489 Other specified disorders of nose and nasal sinuses: Secondary | ICD-10-CM | POA: Insufficient documentation

## 2014-12-05 DIAGNOSIS — Z9119 Patient's noncompliance with other medical treatment and regimen: Secondary | ICD-10-CM | POA: Insufficient documentation

## 2014-12-05 DIAGNOSIS — E119 Type 2 diabetes mellitus without complications: Secondary | ICD-10-CM | POA: Diagnosis not present

## 2014-12-05 LAB — COMPREHENSIVE METABOLIC PANEL
ALBUMIN: 3.8 g/dL (ref 3.5–5.0)
ALT: 25 U/L (ref 17–63)
AST: 23 U/L (ref 15–41)
Alkaline Phosphatase: 63 U/L (ref 38–126)
Anion gap: 8 (ref 5–15)
BUN: 14 mg/dL (ref 6–20)
CALCIUM: 9.2 mg/dL (ref 8.9–10.3)
CO2: 25 mmol/L (ref 22–32)
Chloride: 105 mmol/L (ref 101–111)
Creatinine, Ser: 1 mg/dL (ref 0.61–1.24)
Glucose, Bld: 159 mg/dL — ABNORMAL HIGH (ref 65–99)
POTASSIUM: 3.9 mmol/L (ref 3.5–5.1)
SODIUM: 138 mmol/L (ref 135–145)
Total Bilirubin: 0.6 mg/dL (ref 0.3–1.2)
Total Protein: 7.4 g/dL (ref 6.5–8.1)

## 2014-12-05 LAB — CBC WITH DIFFERENTIAL/PLATELET
Basophils Absolute: 0 10*3/uL (ref 0.0–0.1)
Basophils Relative: 1 % (ref 0–1)
EOS ABS: 0.2 10*3/uL (ref 0.0–0.7)
EOS PCT: 4 % (ref 0–5)
HCT: 37.8 % — ABNORMAL LOW (ref 39.0–52.0)
HEMOGLOBIN: 12.6 g/dL — AB (ref 13.0–17.0)
Lymphocytes Relative: 41 % (ref 12–46)
Lymphs Abs: 1.6 10*3/uL (ref 0.7–4.0)
MCH: 28.6 pg (ref 26.0–34.0)
MCHC: 33.3 g/dL (ref 30.0–36.0)
MCV: 85.7 fL (ref 78.0–100.0)
Monocytes Absolute: 0.5 10*3/uL (ref 0.1–1.0)
Monocytes Relative: 12 % (ref 3–12)
NEUTROS ABS: 1.6 10*3/uL — AB (ref 1.7–7.7)
NEUTROS PCT: 42 % — AB (ref 43–77)
Platelets: 182 10*3/uL (ref 150–400)
RBC: 4.41 MIL/uL (ref 4.22–5.81)
RDW: 12.3 % (ref 11.5–15.5)
WBC: 3.8 10*3/uL — AB (ref 4.0–10.5)

## 2014-12-05 LAB — URINALYSIS, ROUTINE W REFLEX MICROSCOPIC
Bilirubin Urine: NEGATIVE
Hgb urine dipstick: NEGATIVE
Ketones, ur: NEGATIVE mg/dL
Leukocytes, UA: NEGATIVE
Nitrite: NEGATIVE
Protein, ur: NEGATIVE mg/dL
Specific Gravity, Urine: 1.04 — ABNORMAL HIGH (ref 1.005–1.030)
Urobilinogen, UA: 0.2 mg/dL (ref 0.0–1.0)
pH: 5 (ref 5.0–8.0)

## 2014-12-05 LAB — RAPID URINE DRUG SCREEN, HOSP PERFORMED
AMPHETAMINES: NOT DETECTED
BARBITURATES: NOT DETECTED
Benzodiazepines: NOT DETECTED
Cocaine: NOT DETECTED
OPIATES: NOT DETECTED
Tetrahydrocannabinol: NOT DETECTED

## 2014-12-05 LAB — URINE MICROSCOPIC-ADD ON

## 2014-12-05 LAB — I-STAT CG4 LACTIC ACID, ED
LACTIC ACID, VENOUS: 0.66 mmol/L (ref 0.5–2.0)
Lactic Acid, Venous: 0.95 mmol/L (ref 0.5–2.0)

## 2014-12-05 MED ORDER — SODIUM CHLORIDE 0.9 % IV BOLUS (SEPSIS)
1000.0000 mL | Freq: Once | INTRAVENOUS | Status: AC
  Administered 2014-12-05: 1000 mL via INTRAVENOUS

## 2014-12-05 MED ORDER — CEPHALEXIN 500 MG PO CAPS: 500.0000 mg | ORAL_CAPSULE | Freq: Four times a day (QID) | ORAL | Status: DC

## 2014-12-05 MED ORDER — CEPHALEXIN 250 MG PO CAPS
500.0000 mg | ORAL_CAPSULE | Freq: Once | ORAL | Status: AC
  Administered 2014-12-05: 500 mg via ORAL
  Filled 2014-12-05: qty 2

## 2014-12-05 NOTE — Discharge Instructions (Signed)
Fatigue Fatigue is a feeling of tiredness, lack of energy, lack of motivation, or feeling tired all the time. Having enough rest, good nutrition, and reducing stress will normally reduce fatigue. Consult your caregiver if it persists. The nature of your fatigue will help your caregiver to find out its cause. The treatment is based on the cause.  CAUSES  There are many causes for fatigue. Most of the time, fatigue can be traced to one or more of your habits or routines. Most causes fit into one or more of three general areas. They are: Lifestyle problems  Sleep disturbances.  Overwork.  Physical exertion.  Unhealthy habits.  Poor eating habits or eating disorders.  Alcohol and/or drug use .  Lack of proper nutrition (malnutrition). Psychological problems  Stress and/or anxiety problems.  Depression.  Grief.  Boredom. Medical Problems or Conditions  Anemia.  Pregnancy.  Thyroid gland problems.  Recovery from major surgery.  Continuous pain.  Emphysema or asthma that is not well controlled  Allergic conditions.  Diabetes.  Infections (such as mononucleosis).  Obesity.  Sleep disorders, such as sleep apnea.  Heart failure or other heart-related problems.  Cancer.  Kidney disease.  Liver disease.  Effects of certain medicines such as antihistamines, cough and cold remedies, prescription pain medicines, heart and blood pressure medicines, drugs used for treatment of cancer, and some antidepressants. SYMPTOMS  The symptoms of fatigue include:   Lack of energy.  Lack of drive (motivation).  Drowsiness.  Feeling of indifference to the surroundings. DIAGNOSIS  The details of how you feel help guide your caregiver in finding out what is causing the fatigue. You will be asked about your present and past health condition. It is important to review all medicines that you take, including prescription and non-prescription items. A thorough exam will be done.  You will be questioned about your feelings, habits, and normal lifestyle. Your caregiver may suggest blood tests, urine tests, or other tests to look for common medical causes of fatigue.  TREATMENT  Fatigue is treated by correcting the underlying cause. For example, if you have continuous pain or depression, treating these causes will improve how you feel. Similarly, adjusting the dose of certain medicines will help in reducing fatigue.  HOME CARE INSTRUCTIONS   Try to get the required amount of good sleep every night.  Eat a healthy and nutritious diet, and drink enough water throughout the day.  Practice ways of relaxing (including yoga or meditation).  Exercise regularly.  Make plans to change situations that cause stress. Act on those plans so that stresses decrease over time. Keep your work and personal routine reasonable.  Avoid street drugs and minimize use of alcohol.  Start taking a daily multivitamin after consulting your caregiver. SEEK MEDICAL CARE IF:   You have persistent tiredness, which cannot be accounted for.  You have fever.  You have unintentional weight loss.  You have headaches.  You have disturbed sleep throughout the night.  You are feeling sad.  You have constipation.  You have dry skin.  You have gained weight.  You are taking any new or different medicines that you suspect are causing fatigue.  You are unable to sleep at night.  You develop any unusual swelling of your legs or other parts of your body. SEEK IMMEDIATE MEDICAL CARE IF:   You are feeling confused.  Your vision is blurred.  You feel faint or pass out.  You develop severe headache.  You develop severe abdominal, pelvic, or  back pain.  You develop chest pain, shortness of breath, or an irregular or fast heartbeat.  You are unable to pass a normal amount of urine.  You develop abnormal bleeding such as bleeding from the rectum or you vomit blood.  You have thoughts  about harming yourself or committing suicide.  You are worried that you might harm someone else. MAKE SURE YOU:   Understand these instructions.  Will watch your condition.  Will get help right away if you are not doing well or get worse. Document Released: 04/15/2007 Document Revised: 09/10/2011 Document Reviewed: 10/20/2013 [facility] Patient Information 2015 [place], [place]. This information is not intended to replace advice given to you by your health care provider. Make sure you discuss any questions you have with your health care provider. Urinary Tract Infection A urinary tract infection (UTI) can occur any place along the urinary tract. The tract includes the kidneys, ureters, bladder, and urethra. A type of germ called bacteria often causes a UTI. UTIs are often helped with antibiotic medicine.  HOME CARE   If given, take antibiotics as told by your doctor. Finish them even if you start to feel better.  Drink enough fluids to keep your pee (urine) clear or pale yellow.  Avoid tea, drinks with caffeine, and bubbly (carbonated) drinks.  Pee often. Avoid holding your pee in for a long time.  Pee before and after having sex (intercourse).  Wipe from front to back after you poop (bowel movement) if you are a woman. Use each tissue only once. GET HELP RIGHT AWAY IF:   You have back pain.  You have lower belly (abdominal) pain.  You have chills.  You feel sick to your stomach (nauseous).  You throw up (vomit).  Your burning or discomfort with peeing does not go away.  You have a fever.  Your symptoms are not better in 3 days. MAKE SURE YOU:   Understand these instructions.  Will watch your condition.  Will get help right away if you are not doing well or get worse. Document Released: 12/05/2007 Document Revised: 03/12/2012 Document Reviewed: 01/17/2012 [facility] Patient Information 2015 [place], [place]. This information is not intended to replace advice given to you  by your health care provider. Make sure you discuss any questions you have with your health care provider.
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
## 2014-12-05 NOTE — ED Notes (Signed)
Pt remains monitored by blood pressure, pulse ox, and 12 lead. pts family remains at bedside.  
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
## 2014-12-06 LAB — URINE CULTURE
Colony Count: NO GROWTH
Culture: NO GROWTH

## 2014-12-11 LAB — CULTURE, BLOOD (ROUTINE X 2)
CULTURE: NO GROWTH
Culture: NO GROWTH

## 2015-05-19 ENCOUNTER — Emergency Department (HOSPITAL_COMMUNITY): Payer: 59

## 2015-05-19 ENCOUNTER — Emergency Department (HOSPITAL_COMMUNITY)
Admission: EM | Admit: 2015-05-19 | Discharge: 2015-05-19 | Disposition: A | Discharge status: Home / Self Care | Payer: 59 | Attending: Emergency Medicine | Admitting: Emergency Medicine

## 2015-05-19 ENCOUNTER — Encounter (HOSPITAL_COMMUNITY): Payer: Self-pay | Admitting: *Deleted

## 2015-05-19 DIAGNOSIS — Z7982 Long term (current) use of aspirin: Secondary | ICD-10-CM | POA: Diagnosis not present

## 2015-05-19 DIAGNOSIS — E119 Type 2 diabetes mellitus without complications: Secondary | ICD-10-CM | POA: Insufficient documentation

## 2015-05-19 DIAGNOSIS — M1990 Unspecified osteoarthritis, unspecified site: Secondary | ICD-10-CM | POA: Diagnosis not present

## 2015-05-19 DIAGNOSIS — R42 Dizziness and giddiness: Secondary | ICD-10-CM | POA: Insufficient documentation

## 2015-05-19 DIAGNOSIS — I10 Essential (primary) hypertension: Secondary | ICD-10-CM | POA: Insufficient documentation

## 2015-05-19 DIAGNOSIS — Z7984 Long term (current) use of oral hypoglycemic drugs: Secondary | ICD-10-CM | POA: Diagnosis not present

## 2015-05-19 DIAGNOSIS — R0602 Shortness of breath: Secondary | ICD-10-CM | POA: Diagnosis not present

## 2015-05-19 DIAGNOSIS — F17210 Nicotine dependence, cigarettes, uncomplicated: Secondary | ICD-10-CM | POA: Insufficient documentation

## 2015-05-19 DIAGNOSIS — Z79899 Other long term (current) drug therapy: Secondary | ICD-10-CM | POA: Diagnosis not present

## 2015-05-19 LAB — BRAIN NATRIURETIC PEPTIDE: B Natriuretic Peptide: 49.9 pg/mL (ref 0.0–100.0)

## 2015-05-19 LAB — BASIC METABOLIC PANEL
Anion gap: 9 (ref 5–15)
BUN: 13 mg/dL (ref 6–20)
CO2: 24 mmol/L (ref 22–32)
Calcium: 9 mg/dL (ref 8.9–10.3)
Chloride: 110 mmol/L (ref 101–111)
Creatinine, Ser: 0.91 mg/dL (ref 0.61–1.24)
GFR calc Af Amer: >60 mL/min (ref >60)
GFR calc non Af Amer: >60 mL/min (ref >60)
GLUCOSE: 125 mg/dL — AB (ref 65–99)
POTASSIUM: 3.6 mmol/L (ref 3.5–5.1)
Sodium: 143 mmol/L (ref 135–145)

## 2015-05-19 LAB — CBC
HEMATOCRIT: 38.1 % — AB (ref 39.0–52.0)
Hemoglobin: 12.5 g/dL — ABNORMAL LOW (ref 13.0–17.0)
MCH: 28.5 pg (ref 26.0–34.0)
MCHC: 32.8 g/dL (ref 30.0–36.0)
MCV: 86.8 fL (ref 78.0–100.0)
Platelets: 160 10*3/uL (ref 150–400)
RBC: 4.39 MIL/uL (ref 4.22–5.81)
RDW: 12.5 % (ref 11.5–15.5)
WBC: 3.3 10*3/uL — ABNORMAL LOW (ref 4.0–10.5)

## 2015-05-19 LAB — D-DIMER, QUANTITATIVE: D-Dimer, Quant: <0.27 ug/mL-FEU (ref 0.00–0.50)

## 2015-05-19 LAB — TROPONIN I
Troponin I: 0.06 ng/mL — ABNORMAL HIGH (ref <0.031)
Troponin I: 0.03 ng/mL (ref <0.031)

## 2015-05-19 MED ORDER — ASPIRIN 81 MG PO CHEW
324.0000 mg | CHEWABLE_TABLET | Freq: Once | ORAL | Status: AC
  Administered 2015-05-19: 324 mg via ORAL
  Filled 2015-05-19: qty 4

## 2015-05-19 NOTE — ED Notes (Signed)
Pt to xray

## 2015-05-19 NOTE — ED Provider Notes (Signed)
CSN: [id number]     Arrival date & time 05/19/15  1614 History   First MD Initiated Contact with Patient 05/19/15 1625     No chief complaint on file.    (Consider location/radiation/quality/duration/timing/severity/associated sxs/prior Treatment) HPI Comments: 54yo M w/ PMH including HTN, T2DM, arthritis presents with shortness of breath. The patient states that around 11 AM while he was at work he had a sudden onset of shortness of breath. He has had associated lightheadedness. He denies any chest pain. He has had some nausea but no vomiting, diarrhea, or abdominal pain. No fevers, cough/cold symptoms, or recent illness. He endorsed left hand and foot tingling during the episode but denies any currently. No extremity weakness. He states that he had a similar episode of shortness of breath several years ago he had a lot of stress related to a divorce. He reports that he has been stressed out at work this week. No leg swelling, recent travel, history of cancer, or history of blood clots.  The history is provided by the patient.    Past Medical History  Diagnosis Date  . Hypertension   . Diabetes mellitus   . Arthritis    Past Surgical History  Procedure Laterality Date  . Clavicle surgery  2010  . Umbilical hernia repair  2009  . Joint replacement      metal plate in lft knee  . Ankle surgery  1996    L ankle  . Total hip arthroplasty Right 05/15/2013    Procedure: RIGHT TOTAL HIP ARTHROPLASTY ANTERIOR APPROACH;  Surgeon: Kathryne Hitch, MD;  Location: [facility];  Service: Orthopedics;  Laterality: Right;   Family History  Problem Relation Age of Onset  . Diabetes Mother   . Stroke Mother   . Leukemia Maternal Aunt    Social History  Substance Use Topics  . Smoking status: Current Some Day Smoker -- 0.50 packs/day    Types: Cigars  . Smokeless tobacco: None     Comment: a cigar occasionally  . Alcohol Use: Yes     Comment: beer - occasionally    Review of  Systems 10 Systems reviewed and are negative for acute change except as noted in the HPI.    Allergies  Review of patient's allergies indicates no known allergies.  Home Medications   Prior to Admission medications   Medication Sig Start Date End Date Taking? Authorizing Provider  acetaminophen (TYLENOL) 650 MG CR tablet Take 650 mg by mouth every 8 (eight) hours as needed for pain.   Yes Historical Provider, MD  amLODipine (NORVASC) 10 MG tablet Take 10 mg by mouth daily.   Yes Historical Provider, MD  aspirin 325 MG EC tablet Take 1 tablet (325 mg total) by mouth 2 (two) times daily after a meal. 05/19/13  Yes Kirtland Bouchard, PA-C  Canagliflozin (INVOKANA) 300 MG TABS Take 300 mg by mouth every morning.   Yes Historical Provider, MD  CINNAMON PO Take 1,000 mg by mouth 2 (two) times daily.   Yes Historical Provider, MD  glipiZIDE (GLUCOTROL) 5 MG tablet Take 5 mg by mouth 2 (two) times daily before a meal.   Yes Historical Provider, MD  losartan (COZAAR) 100 MG tablet Take 100 mg by mouth every morning.    Yes Historical Provider, MD  meloxicam (MOBIC) 15 MG tablet Take 15 mg by mouth as needed for pain.  08/09/14 08/10/15 Yes Historical Provider, MD  metFORMIN (GLUCOPHAGE) 1000 MG tablet Take 1,000 mg by mouth 2 (  two) times daily with a meal.   Yes Historical Provider, MD  methocarbamol (ROBAXIN) 500 MG tablet Take 1 tablet (500 mg total) by mouth every 6 (six) hours as needed for muscle spasms. 05/19/13  Yes Kirtland Bouchard, PA-C  Multiple Vitamins-Minerals (MULTIVITAMINS THER. W/MINERALS) TABS Take 1 tablet by mouth daily.    Yes Historical Provider, MD  traMADol (ULTRAM) 50 MG tablet Take 50 mg by mouth every 6 (six) hours as needed for severe pain.   Yes Historical Provider, MD  vitamin B-12 (CYANOCOBALAMIN) 100 MCG tablet Take 100 mcg by mouth daily.    Yes Historical Provider, MD  cephALEXin (KEFLEX) 500 MG capsule Take 1 capsule (500 mg total) by mouth 4 (four) times daily. Patient  not taking: Reported on 05/19/2015 12/05/14   Margarita Grizzle, MD  docusate sodium 100 MG CAPS Take 100 mg by mouth 2 (two) times daily. Patient not taking: Reported on 05/19/2015 05/19/13   Kirtland Bouchard, PA-C  HYDROcodone-acetaminophen [facility]) 7.5-325 MG per tablet Take 1-2 tablets by mouth every 4 (four) hours as needed for moderate pain (maximum of 8 per day.). Patient not taking: Reported on 05/19/2015 05/19/13   Kirtland Bouchard, PA-C   BP 110/75 mmHg  Pulse 67  Temp(Src) 98.1 F (36.7 C) (Oral)  Resp 16  Ht  (1.778 m)  Wt 210 lb (95.255 kg)  BMI 30.13 kg/m2  SpO2 97% Physical Exam  Constitutional: He is oriented to person, place, and time. He appears well-developed and well-nourished. No distress.  HENT:  Head: Normocephalic and atraumatic.  Moist mucous membranes  Eyes: Conjunctivae are normal. Pupils are equal, round, and reactive to light.  Neck: Neck supple.  Cardiovascular: Normal rate, regular rhythm and normal heart sounds.   No murmur heard. Pulmonary/Chest: Effort normal and breath sounds normal.  Abdominal: Soft. Bowel sounds are normal. He exhibits no distension. There is no tenderness.  Musculoskeletal: He exhibits no edema.  Neurological: He is alert and oriented to person, place, and time.  Fluent speech  Skin: Skin is warm and dry.  Psychiatric: He has a normal mood and affect. Judgment normal.  Nursing note and vitals reviewed.   ED Course  Procedures (including critical care time) Labs Review Labs Reviewed  BASIC METABOLIC PANEL - Abnormal; Notable for the following:    Glucose, Bld 125 (*)    All other components within normal limits  CBC - Abnormal; Notable for the following:    WBC 3.3 (*)    Hemoglobin 12.5 (*)    HCT 38.1 (*)    All other components within normal limits  TROPONIN I - Abnormal; Notable for the following:    Troponin I 0.06 (*)    All other components within normal limits  BRAIN NATRIURETIC PEPTIDE  D-DIMER, QUANTITATIVE  (NOT AT [facility])  TROPONIN I  URINALYSIS, ROUTINE W REFLEX MICROSCOPIC (NOT AT [facility])  CBG MONITORING, ED    Imaging Review Dg Chest 2 View  05/19/2015  CLINICAL DATA:  Shortness of breath and dizziness EXAM: CHEST - 2 VIEW COMPARISON:  12/05/2014 FINDINGS: The heart size and mediastinal contours are within normal limits. Both lungs are clear. Show evidence of prior left clavicular repair IMPRESSION: No active disease. Electronically Signed   By: Alcide Clever M.D.   On: 05/19/2015 20:28   I have personally reviewed and evaluated these lab results as part of my medical decision-making.   EKG Interpretation   Date/Time:  Thursday May 19 2015 16:16:14 EST Ventricular Rate:  93 PR Interval:  203 QRS Duration: 109 QT Interval:  395 QTC Calculation: 491 R Axis:   -36 Text Interpretation:  Sinus rhythm Ventricular bigeminy Borderline  prolonged PR interval Abnormal R-wave progression, early transition Left  ventricular hypertrophy intermittent ventricular bigeminy new from  previous EKG Confirmed by LITTLE MD, RACHEL ([id number]) on 05/19/2015 5:05:16  PM      MDM   Final diagnoses:  Shortness of breath   Patient presents with sudden onset of shortness of breath associated with lightheadedness that began at work earlier today. Patient well-appearing at presentation. He was mildly hypertensive but had not taken his medications yet today. O2 sat 94-96% on room air. EKG was initially unchanged from previous and then repeat EKG showed ventricular bigeminy. During my examination the patient had regular rhythm. He denied any symptoms in the ED and stated that his shortness of breath had resolved before arrival. Obtained above labs including troponin, BNP, and d-dimer.  The patient remains stable on cardiac monitoring with no obvious arrhythmias. Chest x-ray unremarkable. BNP normal and d-dimer negative therefore I feel that PE is very unlikely. Initial troponin 0.06, repeat 0.03. On  reexamination, the patient is comfortable and denies any complaints. He denies any associated chest pain, nausea, or diaphoresis with his original symptoms therefore I feel that cardiac etiology is less likely. I discussed options including risks/benefits of discharge home with close follow-up versus overnight admission. Pt felt comfortable going home. Reviewed return precautions and patient voice understanding. Patient was discharged in satisfactory condition.  Laurence Spatesachel Morgan Little, MD 05/19/15 [phone]

## 2015-05-19 NOTE — ED Notes (Signed)
Pt still having PVC's. Resting quietly with fiancee at the bedside.

## 2015-05-19 NOTE — ED Notes (Signed)
Pt c/o SOB & dizziness onset today while at work, pt denies CP, v/d, pt c/o nausea, pt rcvd zofran 4 mg IV pta, pt denies pain upon arrival, pt c/o L sided hand & L foot tingling, pt reported to have intermittent bigeminy, pt A&Ox 4

## 2015-05-19 NOTE — ED Notes (Addendum)
Dr. Sibyl Parrhapman in to speak with patient and family. Given happy meals to pt and fiancee

## 2015-05-19 NOTE — Discharge Instructions (Signed)

## 2015-05-19 NOTE — ED Notes (Signed)
In to update pt and family. Family upset that they have been here and not seen an MD back in to update them. Explained to Dr. Sibyl Parrhapman the situation and he mentioned he would step in to speak with them.
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
## 2016-04-17 IMAGING — DX DG CHEST 2V
2 series · 2 of 2 positions shown · non-contrast
Comparison: 12/05/2014

CLINICAL DATA: Shortness of breath and dizziness

EXAM:
CHEST - 2 VIEW

[w chest pa]
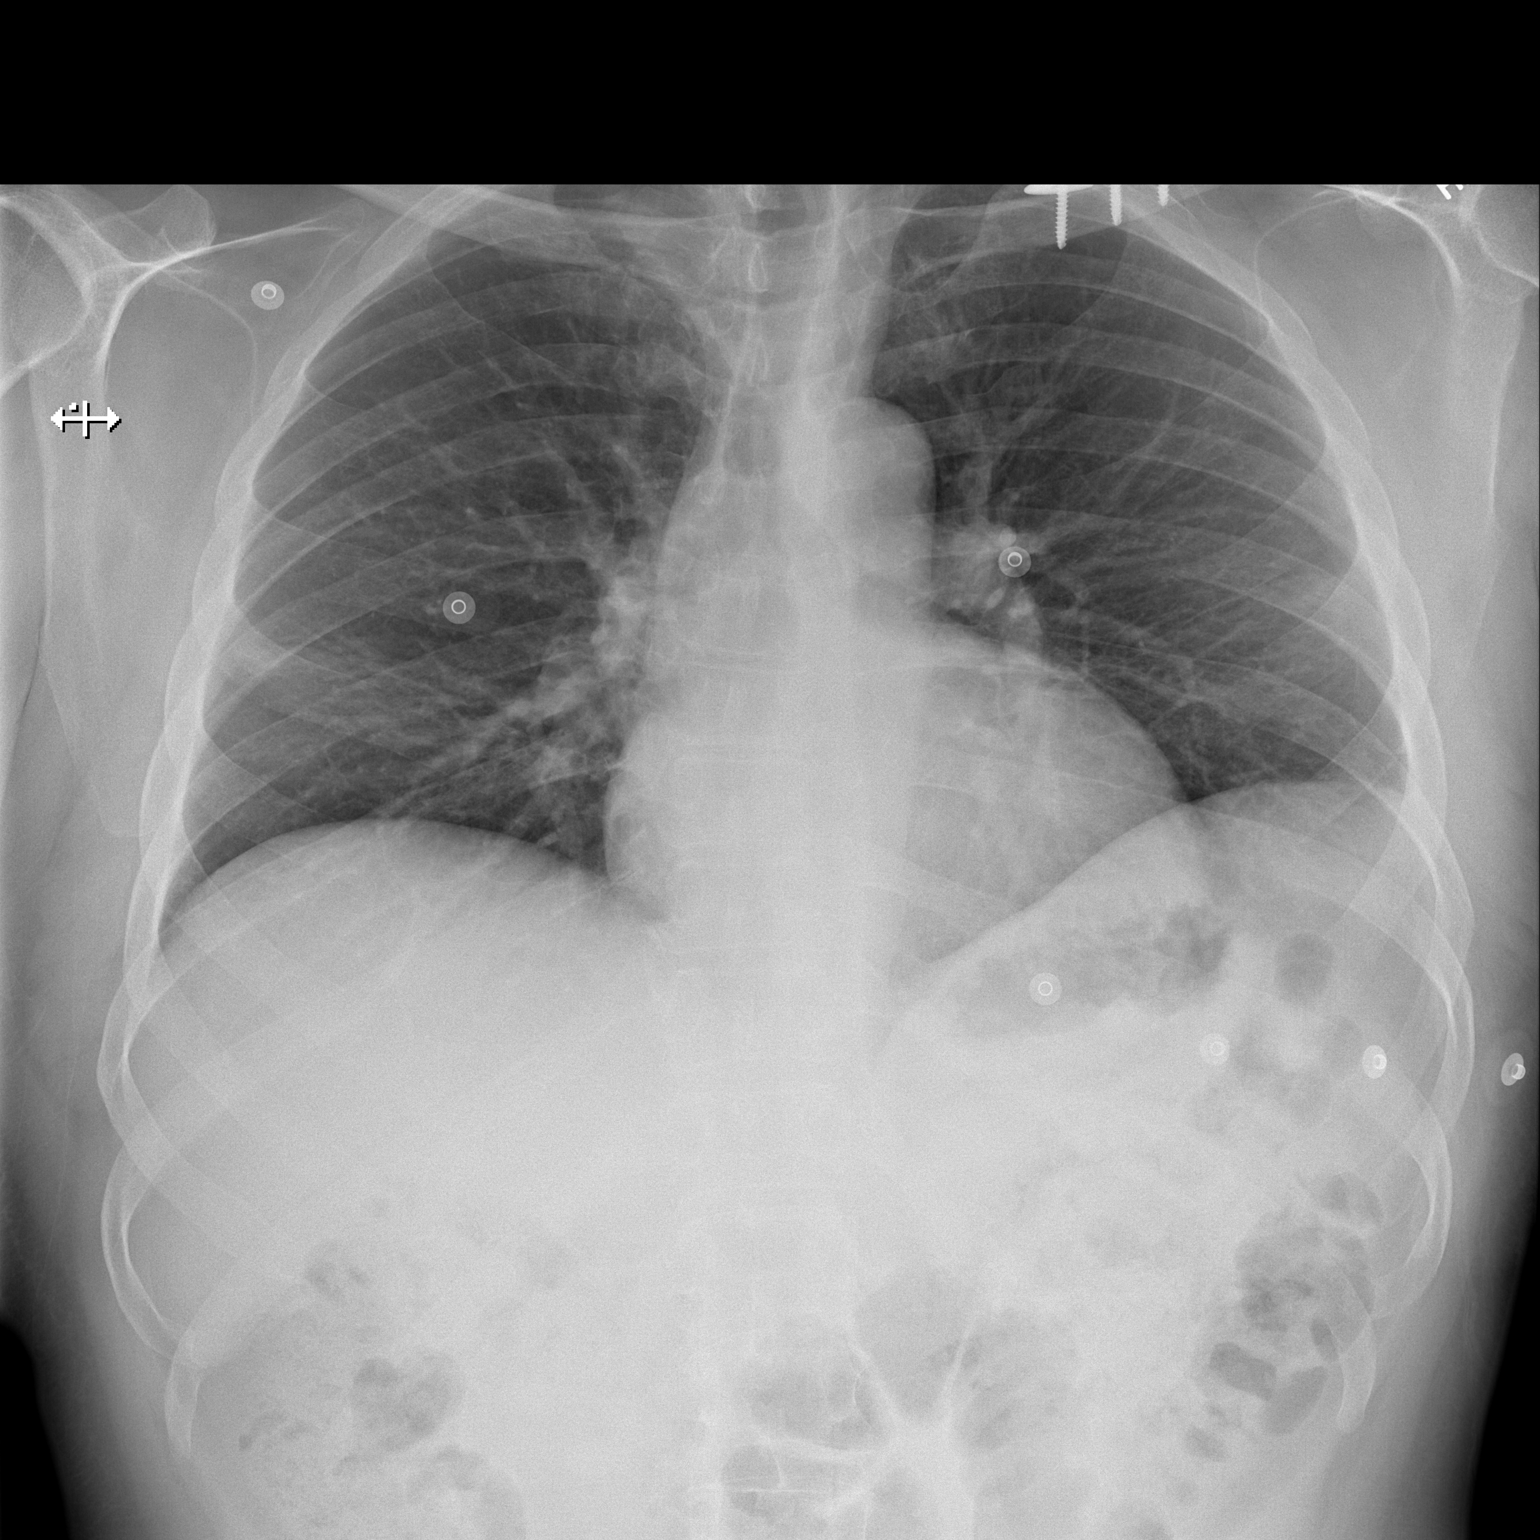

[w chest lat]
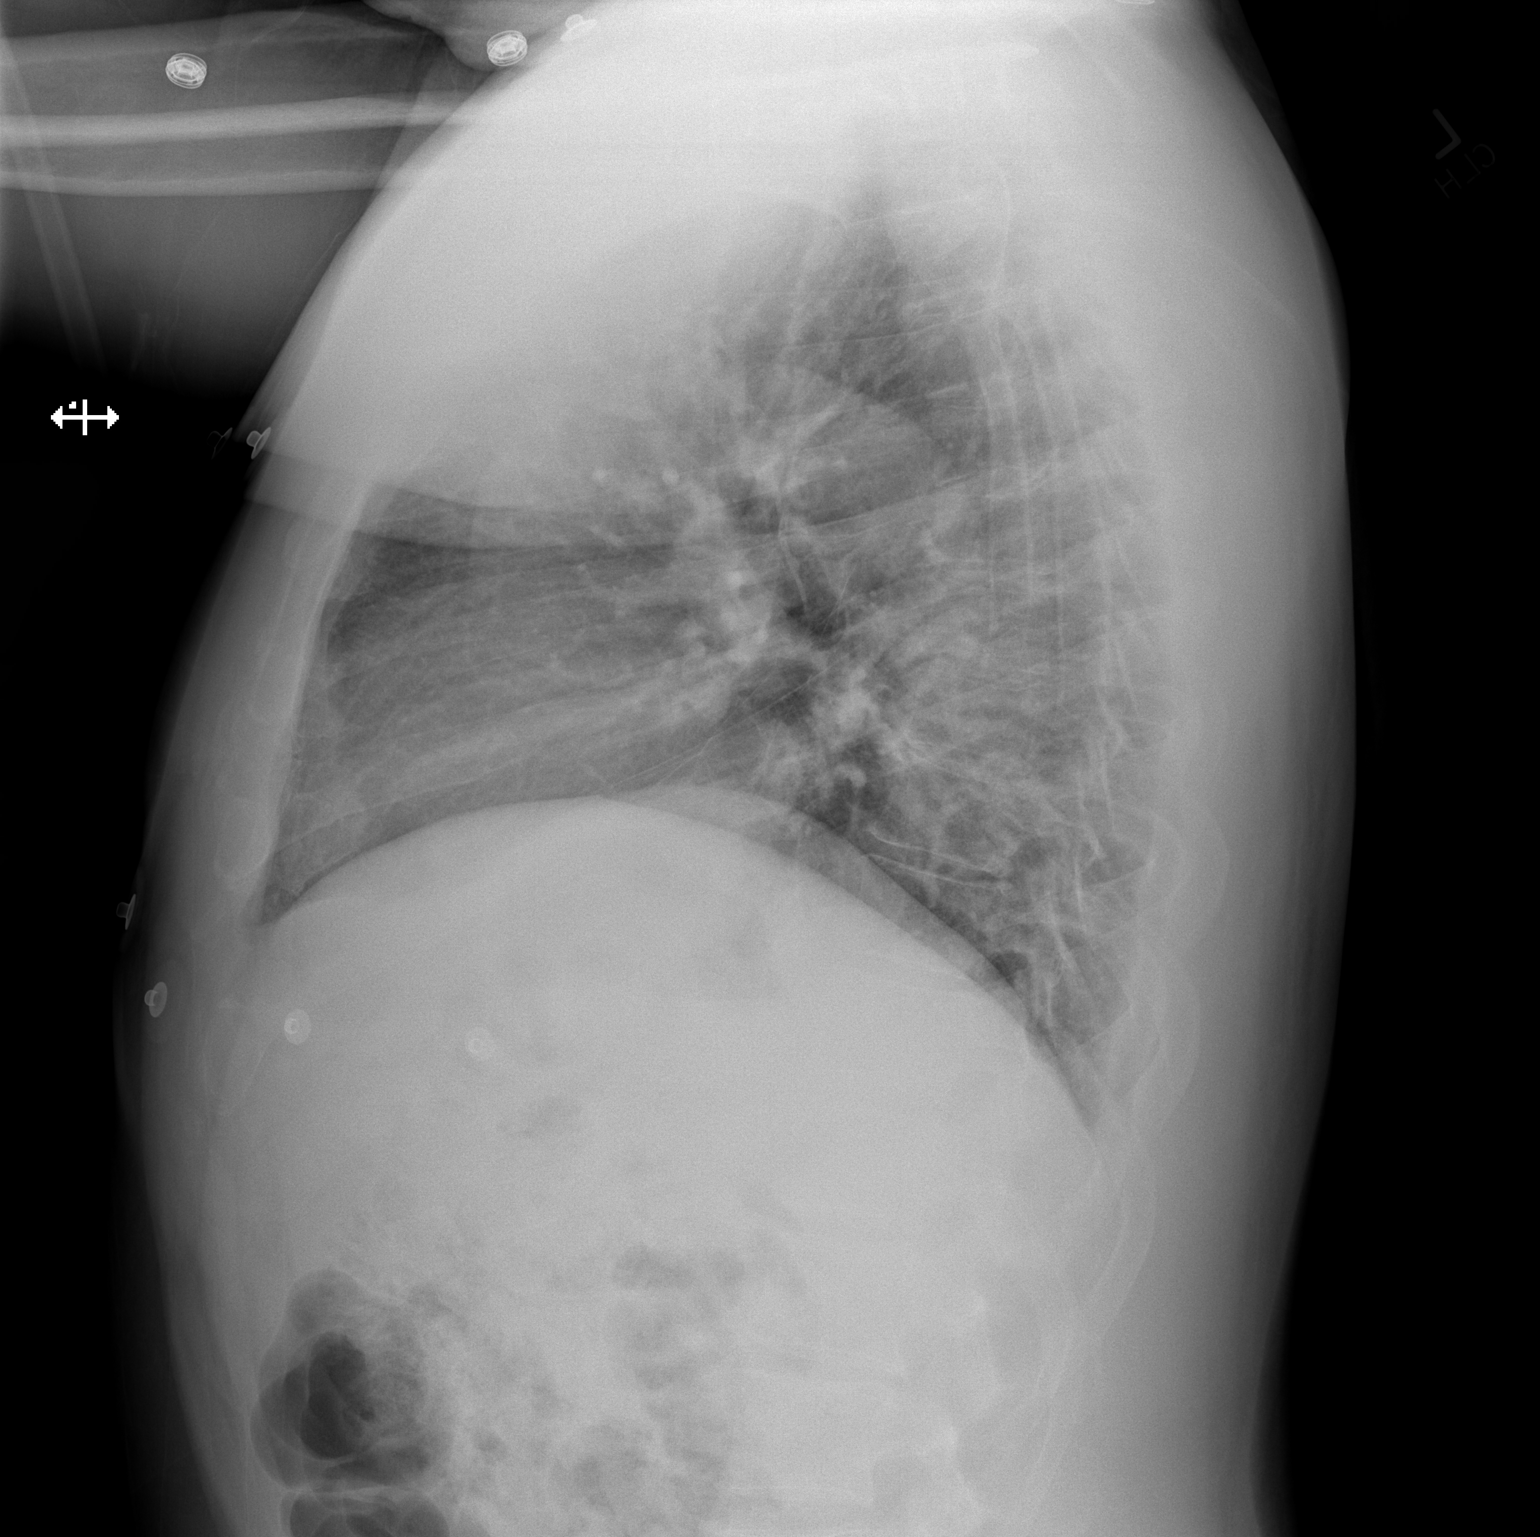

[2 of 2 positions shown; findings below may reference images not displayed]

FINDINGS: The heart size and mediastinal contours are within normal limits.
Both lungs are clear. Show evidence of prior left clavicular repair
IMPRESSION: No active disease.

## 2017-01-05 ENCOUNTER — Emergency Department (HOSPITAL_COMMUNITY): Payer: BLUE CROSS/BLUE SHIELD

## 2017-01-05 ENCOUNTER — Emergency Department (HOSPITAL_COMMUNITY)
Admission: EM | Admit: 2017-01-05 | Discharge: 2017-01-05 | Disposition: A | Discharge status: Home / Self Care | Payer: BLUE CROSS/BLUE SHIELD | Attending: Emergency Medicine | Admitting: Emergency Medicine

## 2017-01-05 ENCOUNTER — Encounter (HOSPITAL_COMMUNITY): Payer: Self-pay | Admitting: Emergency Medicine

## 2017-01-05 DIAGNOSIS — I10 Essential (primary) hypertension: Secondary | ICD-10-CM | POA: Insufficient documentation

## 2017-01-05 DIAGNOSIS — F17290 Nicotine dependence, other tobacco product, uncomplicated: Secondary | ICD-10-CM | POA: Diagnosis not present

## 2017-01-05 DIAGNOSIS — Z79899 Other long term (current) drug therapy: Secondary | ICD-10-CM | POA: Diagnosis not present

## 2017-01-05 DIAGNOSIS — Z7984 Long term (current) use of oral hypoglycemic drugs: Secondary | ICD-10-CM | POA: Insufficient documentation

## 2017-01-05 DIAGNOSIS — Z96641 Presence of right artificial hip joint: Secondary | ICD-10-CM | POA: Insufficient documentation

## 2017-01-05 DIAGNOSIS — Z7982 Long term (current) use of aspirin: Secondary | ICD-10-CM | POA: Insufficient documentation

## 2017-01-05 DIAGNOSIS — R112 Nausea with vomiting, unspecified: Secondary | ICD-10-CM | POA: Insufficient documentation

## 2017-01-05 DIAGNOSIS — E119 Type 2 diabetes mellitus without complications: Secondary | ICD-10-CM | POA: Insufficient documentation

## 2017-01-05 DIAGNOSIS — Z96652 Presence of left artificial knee joint: Secondary | ICD-10-CM | POA: Insufficient documentation

## 2017-01-05 DIAGNOSIS — H538 Other visual disturbances: Secondary | ICD-10-CM | POA: Insufficient documentation

## 2017-01-05 DIAGNOSIS — R51 Headache: Secondary | ICD-10-CM | POA: Insufficient documentation

## 2017-01-05 DIAGNOSIS — R42 Dizziness and giddiness: Secondary | ICD-10-CM | POA: Diagnosis present

## 2017-01-05 LAB — CBC WITH DIFFERENTIAL/PLATELET
Basophils Absolute: 0 10*3/uL (ref 0.0–0.1)
Basophils Relative: 1 %
Eosinophils Absolute: 0.1 10*3/uL (ref 0.0–0.7)
Eosinophils Relative: 4 %
HEMATOCRIT: 39 % (ref 39.0–52.0)
HEMOGLOBIN: 13.2 g/dL (ref 13.0–17.0)
LYMPHS ABS: 1.1 10*3/uL (ref 0.7–4.0)
LYMPHS PCT: 38 %
MCH: 29.1 pg (ref 26.0–34.0)
MCHC: 33.8 g/dL (ref 30.0–36.0)
MCV: 86.1 fL (ref 78.0–100.0)
Monocytes Absolute: 0.4 10*3/uL (ref 0.1–1.0)
Monocytes Relative: 13 %
NEUTROS ABS: 1.3 10*3/uL — AB (ref 1.7–7.7)
NEUTROS PCT: 44 %
Platelets: 146 10*3/uL — ABNORMAL LOW (ref 150–400)
RBC: 4.53 MIL/uL (ref 4.22–5.81)
RDW: 12.1 % (ref 11.5–15.5)
WBC: 3 10*3/uL — AB (ref 4.0–10.5)

## 2017-01-05 LAB — BASIC METABOLIC PANEL
Anion gap: 6 (ref 5–15)
BUN: 15 mg/dL (ref 6–20)
CHLORIDE: 105 mmol/L (ref 101–111)
CO2: 26 mmol/L (ref 22–32)
Calcium: 9 mg/dL (ref 8.9–10.3)
Creatinine, Ser: 1.02 mg/dL (ref 0.61–1.24)
GFR calc non Af Amer: >60 mL/min (ref >60)
Glucose, Bld: 141 mg/dL — ABNORMAL HIGH (ref 65–99)
POTASSIUM: 3.6 mmol/L (ref 3.5–5.1)
SODIUM: 137 mmol/L (ref 135–145)

## 2017-01-05 MED ORDER — MECLIZINE HCL 25 MG PO TABS: 25.0000 mg | ORAL_TABLET | Freq: Three times a day (TID) | ORAL | 0 refills | Status: DC | PRN

## 2017-01-05 MED ORDER — MECLIZINE HCL 25 MG PO TABS
25.0000 mg | ORAL_TABLET | Freq: Once | ORAL | Status: AC
  Administered 2017-01-05: 25 mg via ORAL
  Filled 2017-01-05: qty 1

## 2017-01-05 NOTE — ED Notes (Signed)
Pt. Transported to MRI 

## 2017-01-05 NOTE — ED Triage Notes (Signed)
Pt c/o intermittent dizziness for over a month progressively getting worse up to today. Pt reports N/V and headache onset today.

## 2017-01-05 NOTE — ED Notes (Signed)
Pt c/o dizziness upon discharge. Pt refuses wheelchair. Pt ambulatory on discharge.

## 2017-01-05 NOTE — ED Provider Notes (Signed)
[facility]-EMERGENCY DEPT Provider Note   CSN: [id number] Arrival date & time: 01/05/17  1035     History   Chief Complaint Chief Complaint  Patient presents with  . Dizziness  . Blurred Vision  . Headache  . Emesis    HPI [name] [name] is a 56 y.o. male.  HPI Patient had a round 1 month of episodes of dizziness. Has come and gone. States he feels little drunk or feels little unsteady. States sometimes fall to one side or walking. Has not noticed any difficulty moving his arms or legs. States he did have an episode of blurred vision last night. States when the episodes come on people sometimes have a headache and sometimes it is not. He has occasionally had sugars down to the 50s with that but he has had episodes without hypoglycemia also. Does not come on with changes in his position such as standing. No ringing in his ears. No fevers. States he had episode of vertigo a couple months ago and was seen in the wound. States he had some Antivert at that time. States he did not finish up the course.   Past Medical History:  Diagnosis Date  . Arthritis   . Diabetes mellitus   . Hypertension     Patient Active Problem List   Diagnosis Date Noted  . Hypoxemia 05/17/2013  . Type II or unspecified type diabetes mellitus without mention of complication, uncontrolled 05/17/2013  . Thrombocytopenia, unspecified (HCC) 05/17/2013  . Unspecified essential hypertension 05/17/2013  . Degenerative arthritis of right hip 05/15/2013  . Inguinal strain 09/02/2012    Past Surgical History:  Procedure Laterality Date  . ANKLE SURGERY  1996   L ankle  . CLAVICLE SURGERY  2010  . JOINT REPLACEMENT     metal plate in lft knee  . 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   Procedure: RIGHT TOTAL HIP ARTHROPLASTY ANTERIOR APPROACH;  Surgeon: Kathryne Hitch, MD;  Location: [facility];  Service: Orthopedics;  Laterality: Right;  . UMBILICAL HERNIA REPAIR  2009       Home Medications     Prior to Admission medications   Medication Sig Start Date End Date Taking? Authorizing Provider  acetaminophen (TYLENOL) 650 MG CR tablet Take 650 mg by mouth every 8 (eight) hours as needed for pain.    [provider]  amLODipine (NORVASC) 10 MG tablet Take 10 mg by mouth daily.    [provider]  aspirin 325 MG EC tablet Take 1 tablet (325 mg total) by mouth 2 (two) times daily after a meal. 05/19/13   Chestine Spore, Allayne Gitelman, PA-C  Canagliflozin (INVOKANA) 300 MG TABS Take 300 mg by mouth every morning.    [provider]  cephALEXin (KEFLEX) 500 MG capsule Take 1 capsule (500 mg total) by mouth 4 (four) times daily. Patient not taking: Reported on 05/19/2015 12/05/14   Margarita Grizzle, MD  CINNAMON PO Take 1,000 mg by mouth 2 (two) times daily.    [provider]  docusate sodium 100 MG CAPS Take 100 mg by mouth 2 (two) times daily. Patient not taking: Reported on 05/19/2015 05/19/13   Kirtland Bouchard, PA-C  glipiZIDE (GLUCOTROL) 5 MG tablet Take 5 mg by mouth 2 (two) times daily before a meal.    [provider]  HYDROcodone-acetaminophen (NORCO) 7.5-325 MG per tablet Take 1-2 tablets by mouth every 4 (four) hours as needed for moderate pain (maximum of 8 per day.). Patient not taking: Reported on 05/19/2015  05/19/13   Kirtland Bouchardlark, Gilbert W, PA-C  losartan (COZAAR) 100 MG tablet Take 100 mg by mouth every morning.     [provider]  meclizine (ANTIVERT) 25 MG tablet Take 1 tablet (25 mg total) by mouth 3 (three) times daily as needed for dizziness. 01/05/17   Benjiman CorePickering, Colleena Kurtenbach, MD  metFORMIN (GLUCOPHAGE) 1000 MG tablet Take 1,000 mg by mouth 2 (two) times daily with a meal.    [provider]  methocarbamol (ROBAXIN) 500 MG tablet Take 1 tablet (500 mg total) by mouth every 6 (six) hours as needed for muscle spasms. 05/19/13   Kirtland Bouchardlark, Gilbert W, PA-C  Multiple Vitamins-Minerals (MULTIVITAMINS THER. W/MINERALS) TABS Take 1 tablet by mouth  daily.     [provider]  traMADol (ULTRAM) 50 MG tablet Take 50 mg by mouth every 6 (six) hours as needed for severe pain.    [provider]  vitamin B-12 (CYANOCOBALAMIN) 100 MCG tablet Take 100 mcg by mouth daily.     [provider]    Family History Family History  Problem Relation Age of Onset  . Diabetes Mother   . Stroke Mother   . Leukemia Maternal Aunt     Social History Social History  Substance Use Topics  . Smoking status: Current Some Day Smoker    Packs/day: 0.50    Types: Cigars  . Smokeless tobacco: Never Used     Comment: a cigar occasionally  . Alcohol use Yes     Comment: beer - occasionally     Allergies   Patient has no known allergies.   Review of Systems Review of Systems  Constitutional: Negative for appetite change and fever.  HENT: Negative for ear pain and tinnitus.   Eyes: Negative for photophobia.  Respiratory: Negative for shortness of breath.   Cardiovascular: Negative for chest pain.  Gastrointestinal: Positive for nausea.  Endocrine: Negative for polyuria.  Genitourinary: Negative for frequency.  Musculoskeletal: Negative for back pain.  Skin: Negative for pallor.  Neurological: Positive for dizziness and headaches. Negative for seizures, syncope, speech difficulty and numbness.  Hematological: Negative for adenopathy.  Psychiatric/Behavioral: Negative for confusion.     Physical Exam Updated Vital Signs BP (!) 134/95   Pulse 68   Temp 98.3 F (36.8 C) (Oral)   Resp 16   Ht 5\' 10"  (1.778 m)   Wt 96.2 kg (212 lb)   SpO2 97%   BMI 30.42 kg/m   Physical Exam  Constitutional: He is oriented to person, place, and time. He appears well-developed.  HENT:  Head: Atraumatic.  Bilateral TMs normal.  Eyes: Pupils are equal, round, and reactive to light.  Neck: Neck supple.  Cardiovascular: Normal rate.   Pulmonary/Chest: Effort normal.  Abdominal: Soft. There is no tenderness.  Musculoskeletal:  He exhibits no edema.  Neurological: He is alert and oriented to person, place, and time.  Some nystagmus with gaze to left and right. External ocular movements intact. Pupils reactive. Face symmetric. Good finger-nose bilaterally. No Romberg and normal ambulation.  Skin: Skin is warm.     ED Treatments / Results  Labs (all labs ordered are listed, but only abnormal results are displayed) Labs Reviewed  BASIC METABOLIC PANEL - Abnormal; Notable for the following:       Result Value   Glucose, Bld 141 (*)    All other components within normal limits  CBC WITH DIFFERENTIAL/PLATELET - Abnormal; Notable for the following:    WBC 3.0 (*)    Platelets  146 (*)    Neutro Abs 1.3 (*)    All other components within normal limits    EKG  EKG Interpretation None       Radiology Mr Brain Wo Contrast  Result Date: 01/05/2017 CLINICAL DATA:  56 y/o M; intermittent dizziness for over a month progressively getting worse. Nausea, vomiting, and headache onset today. EXAM: MRI HEAD WITHOUT CONTRAST TECHNIQUE: Multiplanar, multiecho pulse sequences of the brain and surrounding structures were obtained without intravenous contrast. COMPARISON:  05/06/2011 CT head FINDINGS: Brain: Questionable punctate focus of reduced diffusion within the central midbrain (series 3, image 19 and series 300, image 19). No additional focus of reduced diffusion. No abnormal susceptibility hypointensity. Few scattered punctate foci of T2 FLAIR hyperintense signal abnormality are present in subcortical white matter compatible with minimal chronic microvascular ischemic change. No focal mass effect or herniation. Normal ventricle size. Vascular: Normal flow voids. Skull and upper cervical spine: Normal marrow signal. Sinuses/Orbits: Mild paranasal sinus mucosal thickening. No significant abnormal signal of mastoid air cells. Normal orbits. Other: None. IMPRESSION: 1. Questionable punctate focus of reduced diffusion within central  midbrain. This is suspected to be artifactual given duration of symptoms, less likely acute small vessel infarction. No additional potential evidence for ischemia. 2. Minimal chronic microvascular ischemic changes. 3. Mild paranasal sinus disease. These results were called by telephone at the time of interpretation on 01/05/2017 at 1:28 pm to Dr. Benjiman Core , who verbally acknowledged these results. Electronically Signed   By: Mitzi Hansen M.D.   On: 01/05/2017 13:31    Procedures Procedures (including critical care time)  Medications Ordered in ED Medications  meclizine (ANTIVERT) tablet 25 mg (25 mg Oral Given 01/05/17 1133)     Initial Impression / Assessment and Plan / ED Course  I have reviewed the triage vital signs and the nursing notes.  Pertinent labs & imaging results that were available during my care of the patient were reviewed by me and considered in my medical decision making (see chart for details).     Patient with vertiginous symptoms. MRI reassuring. Likely peripheral cause. Still has some dizziness. Able ambulate with Antivert. Discharge home follow-up with ENT. Some abnormality on MRI likely artifact. Likely not related to this symptoms  Final Clinical Impressions(s) / ED Diagnoses   Final diagnoses:  Vertigo    New Prescriptions New Prescriptions   MECLIZINE (ANTIVERT) 25 MG TABLET    Take 1 tablet (25 mg total) by mouth 3 (three) times daily as needed for dizziness.     Benjiman Core, MD 01/05/17 [phone]

## 2017-05-06 ENCOUNTER — Ambulatory Visit (INDEPENDENT_AMBULATORY_CARE_PROVIDER_SITE_OTHER): Payer: Self-pay | Admitting: Orthopaedic Surgery

## 2017-07-01 ENCOUNTER — Telehealth (INDEPENDENT_AMBULATORY_CARE_PROVIDER_SITE_OTHER): Payer: Self-pay | Admitting: Orthopaedic Surgery

## 2017-07-01 NOTE — Telephone Encounter (Signed)
Returned call to patient got recording mailbox is full   could not leave message ?

## 2017-07-03 ENCOUNTER — Ambulatory Visit (INDEPENDENT_AMBULATORY_CARE_PROVIDER_SITE_OTHER): Payer: BLUE CROSS/BLUE SHIELD

## 2017-07-03 ENCOUNTER — Ambulatory Visit (INDEPENDENT_AMBULATORY_CARE_PROVIDER_SITE_OTHER): Payer: BLUE CROSS/BLUE SHIELD | Admitting: Orthopaedic Surgery

## 2017-07-03 ENCOUNTER — Encounter (INDEPENDENT_AMBULATORY_CARE_PROVIDER_SITE_OTHER): Payer: Self-pay

## 2017-07-03 ENCOUNTER — Other Ambulatory Visit (INDEPENDENT_AMBULATORY_CARE_PROVIDER_SITE_OTHER): Payer: Self-pay

## 2017-07-03 ENCOUNTER — Telehealth (INDEPENDENT_AMBULATORY_CARE_PROVIDER_SITE_OTHER): Payer: Self-pay

## 2017-07-03 ENCOUNTER — Encounter (INDEPENDENT_AMBULATORY_CARE_PROVIDER_SITE_OTHER): Payer: Self-pay | Admitting: Orthopaedic Surgery

## 2017-07-03 DIAGNOSIS — M25551 Pain in right hip: Secondary | ICD-10-CM

## 2017-07-03 DIAGNOSIS — M25562 Pain in left knee: Secondary | ICD-10-CM

## 2017-07-03 DIAGNOSIS — Z96641 Presence of right artificial hip joint: Secondary | ICD-10-CM

## 2017-07-03 DIAGNOSIS — G8929 Other chronic pain: Secondary | ICD-10-CM | POA: Diagnosis not present

## 2017-07-03 NOTE — Telephone Encounter (Signed)
Patient would like a work note for today.  Stated that he is hurting.  Cb# [phone].  Please advise.

## 2017-07-03 NOTE — Telephone Encounter (Signed)
Patient aware note is ready  

## 2017-07-03 NOTE — Progress Notes (Signed)
Office Visit Note   Patient: [name] [name]           Date of Birth: January 30, 1961           MRN: [id number] Visit Date: 07/03/2017              Requested by: Pollyann Samples, MD [address] [place], [place] [address] PCP: [facility]   Assessment & Plan: Visit Diagnoses:  1. Chronic pain of left knee   2. Right hip pain   3. Status post total replacement of right hip     Plan: Quad strengthening exercises shown and also IT band stretching exercises.  We will have him follow-up with [place] in 1 month to check his progress lack of.  He may require knee arthroscopy if his knee pain persists despite conservative treatment.  He would not be a candidate for imaging of the knee with MRI or CT due to scatter that would be involved due to the tibial plateau fracture.  Other options at the supplemental injection in the left knee.  Patient seen and evaluated by Dr. Eliberto [facility].  Questions were encouraged and answered at length.  Follow-Up Instructions: Return in about 4 weeks (around 07/31/2017).   Orders:  Orders Placed This Encounter  Procedures  . Large Joint Inj  . XR Knee 1-2 Views Left  . XR HIP UNILAT W OR W/O PELVIS 1V RIGHT   No orders of the defined types were placed in this encounter.     Procedures: Large Joint Inj on 07/03/2017 9:31 AM Indications: pain Details: 22 G 1.5 in needle, anterolateral approach  Arthrogram: No  Medications: 3 mL lidocaine 1 %; 40 mg methylPREDNISolone acetate 40 MG/ML Outcome: tolerated well, no immediate complications Procedure, treatment alternatives, risks and benefits explained, specific risks discussed. Consent was given by the patient. Immediately prior to procedure a time out was called to verify the correct patient, procedure, equipment, support staff and site/side marked as required. Patient was prepped and draped in the usual sterile fashion.       Clinical Data: No additional  findings.   Subjective: Chief Complaint  Patient presents with  . Right Hip - Pain  . Left Knee - Pain    HPI [name] [name] returns today for right hip pain and left knee pain.  We have not seen him in the last 3years.  He is 4 years status post right total hip arthroplasty by Dr. Magnus Ivan.  Over the last year he started developing pinching sensation in the hip.  He does complain of some low back pain but no radicular symptoms down the leg.  States at times that the area around the incision swells but he has had no redness or drainage.  He denies any fevers chills.  Left knee he underwent a open reduction internal fixation of Dr. Carola Frost in past and is done well until recently and is now having swelling and pain in the left knee states that left knee pain is severe.  States that he feels as if it is weak but he has no mechanical symptoms.  He has had no new injury to the left knee.  States Advil muscle relaxers and Norco all helps some with the pain of both the hip and the knee.  Having no back pain. Review of Systems See HPI otherwise negative  Objective: Vital Signs: There were no vitals taken for this visit.  Physical Exam  Constitutional: He is oriented  to person, place, and time. He appears well-developed and well-nourished. No distress.  Pulmonary/Chest: Effort normal.  Neurological: He is alert and oriented to person, place, and time.  Skin: He is not diaphoretic.  Psychiatric: He has a normal mood and affect.    Ortho Exam Left knee full range of motion.  Slight tell femoral crepitus with passive range of motion.  No instability valgus varus stressing.  There is no effusion abnormal warmth erythema surgical incision is well-healed.  Anterior drawer is negative.  He has no tenderness along medial lateral joint line.  Janeece Riggerssmond Clark test is negative. Bilateral hips he has excellent range of motion of both hips.  On exam he has slight discomfort with internal rotation of the right hip.   Surgical incisions well-healed no signs of infection.  No appreciable swelling about the incision or signs of infection.  Slight tenderness over the right trochanteric region Specialty Comments:  No specialty comments available.  Imaging: Xr Hip Unilat W Or W/o Pelvis 1v Right  Result Date: 07/03/2017 Ap pelvis and lateral right hip: No acute fracture. Arthroplasty components appear well seated. Slight lucency proximal femoral component consent with normal findings . Hip is well located.   Xr Knee 1-2 Views Left  Result Date: 07/03/2017 Left knee AP lateral views: Status post ORIF of tibial plateau fracture.  No hardware failure.  Knee appears to be well preserved overall.  No dislocation or subluxation.  No acute fracture.    PMFS History: Patient Active Problem List   Diagnosis Date Noted  . Hypoxemia 05/17/2013  . Type II or unspecified type diabetes mellitus without mention of complication, uncontrolled 05/17/2013  . Thrombocytopenia, unspecified (HCC) 05/17/2013  . Unspecified essential hypertension 05/17/2013  . Degenerative arthritis of right hip 05/15/2013  . Inguinal strain 09/02/2012   Past Medical History:  Diagnosis Date  . Arthritis   . Diabetes mellitus   . Hypertension     Family History  Problem Relation Age of Onset  . Diabetes Mother   . Stroke Mother   . Leukemia Maternal Aunt     Past Surgical History:  Procedure Laterality Date  . ANKLE SURGERY  1996   L ankle  . CLAVICLE SURGERY  2010  . JOINT REPLACEMENT     metal plate in lft knee  . 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   Procedure: RIGHT TOTAL HIP ARTHROPLASTY ANTERIOR APPROACH;  Surgeon: Kathryne Hitchhristopher Y Blackman, MD;  Location: [facility];  Service: Orthopedics;  Laterality: Right;  . UMBILICAL HERNIA REPAIR  2009   Social History   Occupational History  . Not on file  Tobacco Use  . Smoking status: Current Some Day Smoker    Packs/day: 0.50    Types: Cigars  . Smokeless tobacco: Never Used   . Tobacco comment: a cigar occasionally  Substance and Sexual Activity  . Alcohol use: Yes    Comment: beer - occasionally  . Drug use: No  . Sexual activity: Not on file

## 2017-07-04 MED ORDER — LIDOCAINE HCL 1 % IJ SOLN
3.0000 mL | INTRAMUSCULAR | Status: AC | PRN
  Administered 2017-07-03: 3 mL

## 2017-07-04 MED ORDER — METHYLPREDNISOLONE ACETATE 40 MG/ML IJ SUSP
40.0000 mg | INTRAMUSCULAR | Status: AC | PRN
Start: 2017-07-03 — End: 2017-07-03
  Administered 2017-07-03: 40 mg via INTRA_ARTICULAR

## 2017-07-22 ENCOUNTER — Telehealth (INDEPENDENT_AMBULATORY_CARE_PROVIDER_SITE_OTHER): Payer: Self-pay | Admitting: Orthopaedic Surgery

## 2017-07-22 NOTE — Telephone Encounter (Signed)
See below

## 2017-07-22 NOTE — Telephone Encounter (Signed)
Patient called saying he is still in a lot of pain and that the injection did not help. I offered to make him an appointment sooner but he is going to keep his 1 month follow up on 02/04. He just wanted to let Dr. Magnus IvanBlackman know.

## 2017-08-05 ENCOUNTER — Ambulatory Visit (INDEPENDENT_AMBULATORY_CARE_PROVIDER_SITE_OTHER): Payer: BLUE CROSS/BLUE SHIELD

## 2017-08-05 ENCOUNTER — Encounter (INDEPENDENT_AMBULATORY_CARE_PROVIDER_SITE_OTHER): Payer: Self-pay | Admitting: Orthopaedic Surgery

## 2017-08-05 ENCOUNTER — Ambulatory Visit (INDEPENDENT_AMBULATORY_CARE_PROVIDER_SITE_OTHER): Payer: BLUE CROSS/BLUE SHIELD | Admitting: Orthopaedic Surgery

## 2017-08-05 DIAGNOSIS — G8929 Other chronic pain: Secondary | ICD-10-CM | POA: Insufficient documentation

## 2017-08-05 DIAGNOSIS — M25562 Pain in left knee: Secondary | ICD-10-CM

## 2017-08-05 DIAGNOSIS — M545 Low back pain, unspecified: Secondary | ICD-10-CM | POA: Insufficient documentation

## 2017-08-05 DIAGNOSIS — M25551 Pain in right hip: Secondary | ICD-10-CM | POA: Insufficient documentation

## 2017-08-05 DIAGNOSIS — Z9889 Other specified postprocedural states: Secondary | ICD-10-CM | POA: Insufficient documentation

## 2017-08-05 DIAGNOSIS — Z96641 Presence of right artificial hip joint: Secondary | ICD-10-CM

## 2017-08-05 NOTE — Progress Notes (Signed)
Office Visit Note Who loses  Patient: [name] [name]           Date of Birth: 05-24-1961           MRN: [id number] Visit Date: 08/05/2017              [facility]ed by: [facility] No address on file PCP: [facility]   Assessment & Plan: Visit Diagnoses:  1. Chronic bilateral low back pain, with sciatica presence unspecified   2. Pain of right hip joint   3. History of total right hip replacement   4. Chronic pain of left knee     Plan: I would like to first order a three-phase bone scan to rule out prosthetic loosening of his right hip as well as to see if it has any uptake around the lower aspect of his lumbar spine as well as his left knee.  He may end up having to have an MRI of his lumbar spine as well as the left knee at some point or to focus on the right hip first.  He is 5 years out from that hip replacement.  We will see him back in 2 weeks ago when he stands with him inferior what other treatment plans necessary.  I did give him a note to keep him out of work until further notice due to his pain.  Follow-Up Instructions: Return in about 2 weeks (around 08/19/2017).   Orders:  Orders Placed This Encounter  Procedures  . XR Lumbar Spine 2-3 Views   No orders of the defined types were placed in this encounter.     Procedures: No procedures performed   Clinical Data: No additional findings.   Subjective: Chief Complaint  Patient presents with  . Left Knee - Pain, Follow-up  The patient is coming in today with multiple pain complaints.  He has left knee pain as well as right hip pain and low back pain.  He needs a note to keep him out of work until further notice to the severity of his pain is keeping up at night.  He said previous surgery on his left knee for tibial plateau fracture.  He has had a right hip replacement as well.  It hurts with left knee is lower lumbar spine as well as in the groin and down  his thigh on the right side.  X-rays showed just slight lucency at the shoulder of the hip replacement and this is minimal.  We tried a steroid injection in his left knee and I did not help and had him on anti-inflammatories.  HPI  Review of Systems He currently denies any headache, chest pain, shortness of breath, fever, chills, nausea, vomiting.  Objective: Vital Signs: There were no vitals taken for this visit.  Physical Exam He is alert and oriented x3 and in no acute distress Ortho Exam Examination of his left knee shows no effusion but painful range of motion.  Examination of his right hip is normal but he describes a deep pain.  Examination of his lumbar spine shows severe pain to flexion and extension. Specialty Comments:  No specialty comments available.  Imaging: Xr Lumbar Spine 2-3 Views  Result Date: 08/05/2017  2 views of the lumbar spine shows severe facet arthritis between L5 and S1.    PMFS History: Patient Active Problem List   Diagnosis Date Noted  . Chronic bilateral low back pain 08/05/2017  . History of total right  hip replacement 08/05/2017  . Pain of right hip joint 08/05/2017  . Chronic pain of left knee 08/05/2017  . Hypoxemia 05/17/2013  . Type II or unspecified type diabetes mellitus without mention of complication, uncontrolled 05/17/2013  . Thrombocytopenia, unspecified (HCC) 05/17/2013  . Unspecified essential hypertension 05/17/2013  . Degenerative arthritis of right hip 05/15/2013  . Inguinal strain 09/02/2012   Past Medical History:  Diagnosis Date  . Arthritis   . Diabetes mellitus   . Hypertension     Family History  Problem Relation Age of Onset  . Diabetes Mother   . Stroke Mother   . Leukemia Maternal Aunt     Past Surgical History:  Procedure Laterality Date  . ANKLE SURGERY  1996   L ankle  . CLAVICLE SURGERY  2010  . JOINT REPLACEMENT     metal plate in lft knee  . 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   Procedure:  RIGHT TOTAL HIP ARTHROPLASTY ANTERIOR APPROACH;  Surgeon: Kathryne Hitchhristopher Y Khing Belcher, MD;  Location: [facility];  Service: Orthopedics;  Laterality: Right;  . UMBILICAL HERNIA REPAIR  2009   Social History   Occupational History  . Not on file  Tobacco Use  . Smoking status: Current Some Day Smoker    Packs/day: 0.50    Types: Cigars  . Smokeless tobacco: Never Used  . Tobacco comment: a cigar occasionally  Substance and Sexual Activity  . Alcohol use: Yes    Comment: beer - occasionally  . Drug use: No  . Sexual activity: Not on file

## 2017-08-09 ENCOUNTER — Other Ambulatory Visit (INDEPENDENT_AMBULATORY_CARE_PROVIDER_SITE_OTHER): Payer: Self-pay

## 2017-08-09 DIAGNOSIS — Z96641 Presence of right artificial hip joint: Secondary | ICD-10-CM

## 2017-08-15 ENCOUNTER — Telehealth (INDEPENDENT_AMBULATORY_CARE_PROVIDER_SITE_OTHER): Payer: Self-pay | Admitting: *Deleted

## 2017-08-15 NOTE — Telephone Encounter (Signed)
Pt is scheduled for Bone scan on Wednesday Feb 20th at 10am for the injection and then back at 1pm [facility]. I called and left message on vm for pt to return call.

## 2017-08-16 ENCOUNTER — Telehealth (INDEPENDENT_AMBULATORY_CARE_PROVIDER_SITE_OTHER): Payer: Self-pay | Admitting: Orthopaedic Surgery

## 2017-08-16 NOTE — Telephone Encounter (Signed)
Pt aware of appt.

## 2017-08-16 NOTE — Telephone Encounter (Signed)
[employer] insurance please call pt to verify information. Im not sure if pt needs an authorization. I told pt I would place a telephone call to help him get his questions answered.

## 2017-08-19 ENCOUNTER — Encounter (INDEPENDENT_AMBULATORY_CARE_PROVIDER_SITE_OTHER): Payer: Self-pay

## 2017-08-19 ENCOUNTER — Ambulatory Visit (INDEPENDENT_AMBULATORY_CARE_PROVIDER_SITE_OTHER): Payer: BLUE CROSS/BLUE SHIELD | Admitting: Orthopaedic Surgery

## 2017-08-19 DIAGNOSIS — M25551 Pain in right hip: Secondary | ICD-10-CM

## 2017-08-19 NOTE — Telephone Encounter (Signed)
Patient was seen and helped today at his appointment

## 2017-08-20 ENCOUNTER — Encounter (INDEPENDENT_AMBULATORY_CARE_PROVIDER_SITE_OTHER): Payer: Self-pay

## 2017-08-20 ENCOUNTER — Telehealth (INDEPENDENT_AMBULATORY_CARE_PROVIDER_SITE_OTHER): Payer: Self-pay | Admitting: Orthopaedic Surgery

## 2017-08-20 NOTE — Progress Notes (Signed)
Had to be rescheduled 

## 2017-08-20 NOTE — Telephone Encounter (Signed)
Patients letter created yesterday had the wrong date for the follow up to discuss scans, letter had 07/28/17 and he needs that changed to say 08/28/17. He needs it refaxed and he would also like a copy for himself to be picked up in office. Please let him know when ready # [phone]

## 2017-08-20 NOTE — Telephone Encounter (Signed)
Note written and given to patient.  

## 2017-08-21 ENCOUNTER — Encounter (HOSPITAL_COMMUNITY)
Admission: RE | Admit: 2017-08-21 | Discharge: 2017-08-21 | Disposition: A | Discharge status: Home / Self Care | Payer: BLUE CROSS/BLUE SHIELD | Source: Ambulatory Visit | Attending: Orthopaedic Surgery | Admitting: Orthopaedic Surgery

## 2017-08-21 ENCOUNTER — Encounter (HOSPITAL_COMMUNITY): Payer: Self-pay

## 2017-08-21 DIAGNOSIS — Z96641 Presence of right artificial hip joint: Secondary | ICD-10-CM

## 2017-08-21 MED ORDER — TECHNETIUM TC 99M MEDRONATE IV KIT
25.0000 | PACK | Freq: Once | INTRAVENOUS | Status: AC | PRN
  Administered 2017-08-21: 25 via INTRAVENOUS

## 2017-08-26 ENCOUNTER — Other Ambulatory Visit: Payer: Self-pay

## 2017-08-26 ENCOUNTER — Emergency Department (HOSPITAL_COMMUNITY): Payer: BLUE CROSS/BLUE SHIELD

## 2017-08-26 ENCOUNTER — Encounter (HOSPITAL_COMMUNITY): Payer: Self-pay | Admitting: *Deleted

## 2017-08-26 DIAGNOSIS — Z96641 Presence of right artificial hip joint: Secondary | ICD-10-CM | POA: Diagnosis not present

## 2017-08-26 DIAGNOSIS — Z96652 Presence of left artificial knee joint: Secondary | ICD-10-CM | POA: Diagnosis not present

## 2017-08-26 DIAGNOSIS — E1165 Type 2 diabetes mellitus with hyperglycemia: Secondary | ICD-10-CM | POA: Diagnosis not present

## 2017-08-26 DIAGNOSIS — Z79899 Other long term (current) drug therapy: Secondary | ICD-10-CM | POA: Insufficient documentation

## 2017-08-26 DIAGNOSIS — M25511 Pain in right shoulder: Secondary | ICD-10-CM | POA: Diagnosis not present

## 2017-08-26 DIAGNOSIS — Z7982 Long term (current) use of aspirin: Secondary | ICD-10-CM | POA: Diagnosis not present

## 2017-08-26 DIAGNOSIS — F17290 Nicotine dependence, other tobacco product, uncomplicated: Secondary | ICD-10-CM | POA: Diagnosis not present

## 2017-08-26 DIAGNOSIS — Z7984 Long term (current) use of oral hypoglycemic drugs: Secondary | ICD-10-CM | POA: Insufficient documentation

## 2017-08-26 DIAGNOSIS — R0602 Shortness of breath: Secondary | ICD-10-CM | POA: Insufficient documentation

## 2017-08-26 DIAGNOSIS — I10 Essential (primary) hypertension: Secondary | ICD-10-CM | POA: Insufficient documentation

## 2017-08-26 LAB — BASIC METABOLIC PANEL
ANION GAP: 13 (ref 5–15)
BUN: 12 mg/dL (ref 6–20)
CALCIUM: 9.1 mg/dL (ref 8.9–10.3)
CO2: 21 mmol/L — ABNORMAL LOW (ref 22–32)
Chloride: 98 mmol/L — ABNORMAL LOW (ref 101–111)
Creatinine, Ser: 0.9 mg/dL (ref 0.61–1.24)
Glucose, Bld: 473 mg/dL — ABNORMAL HIGH (ref 65–99)
POTASSIUM: 3.7 mmol/L (ref 3.5–5.1)
SODIUM: 132 mmol/L — AB (ref 135–145)

## 2017-08-26 LAB — CBC
HEMATOCRIT: 39.5 % (ref 39.0–52.0)
HEMOGLOBIN: 13.6 g/dL (ref 13.0–17.0)
MCH: 29.1 pg (ref 26.0–34.0)
MCHC: 34.4 g/dL (ref 30.0–36.0)
MCV: 84.6 fL (ref 78.0–100.0)
Platelets: 142 10*3/uL — ABNORMAL LOW (ref 150–400)
RBC: 4.67 MIL/uL (ref 4.22–5.81)
RDW: 11.8 % (ref 11.5–15.5)
WBC: 3.2 10*3/uL — AB (ref 4.0–10.5)

## 2017-08-26 LAB — I-STAT TROPONIN, ED: TROPONIN I, POC: 0.01 ng/mL (ref 0.00–0.08)
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
## 2017-08-27 ENCOUNTER — Emergency Department (HOSPITAL_COMMUNITY): Payer: BLUE CROSS/BLUE SHIELD

## 2017-08-27 ENCOUNTER — Emergency Department (HOSPITAL_COMMUNITY)
Admission: EM | Admit: 2017-08-27 | Discharge: 2017-08-27 | Disposition: A | Discharge status: Home / Self Care | Payer: BLUE CROSS/BLUE SHIELD | Attending: Emergency Medicine | Admitting: Emergency Medicine

## 2017-08-27 DIAGNOSIS — R739 Hyperglycemia, unspecified: Secondary | ICD-10-CM

## 2017-08-27 DIAGNOSIS — M25511 Pain in right shoulder: Secondary | ICD-10-CM

## 2017-08-27 DIAGNOSIS — R0602 Shortness of breath: Secondary | ICD-10-CM

## 2017-08-27 LAB — I-STAT TROPONIN, ED: Troponin i, poc: 0.01 ng/mL (ref 0.00–0.08)

## 2017-08-27 LAB — D-DIMER, QUANTITATIVE: D-Dimer, Quant: 0.99 ug/mL-FEU — ABNORMAL HIGH (ref 0.00–0.50)

## 2017-08-27 LAB — CBG MONITORING, ED: Glucose-Capillary: 272 mg/dL — ABNORMAL HIGH (ref 65–99)

## 2017-08-27 MED ORDER — CYCLOBENZAPRINE HCL 10 MG PO TABS: 10.0000 mg | ORAL_TABLET | Freq: Two times a day (BID) | ORAL | 0 refills | Status: DC | PRN

## 2017-08-27 MED ORDER — INSULIN ASPART 100 UNIT/ML ~~LOC~~ SOLN
10.0000 [IU] | Freq: Once | SUBCUTANEOUS | Status: AC
  Administered 2017-08-27: 10 [IU] via INTRAVENOUS
  Filled 2017-08-27: qty 1

## 2017-08-27 MED ORDER — SODIUM CHLORIDE 0.9 % IV BOLUS (SEPSIS)
1000.0000 mL | Freq: Once | INTRAVENOUS | Status: AC
  Administered 2017-08-27: 1000 mL via INTRAVENOUS

## 2017-08-27 MED ORDER — IOPAMIDOL (ISOVUE-370) INJECTION 76%
INTRAVENOUS | Status: AC
  Administered 2017-08-27: 100 mL
  Filled 2017-08-27: qty 100
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
## 2017-08-27 NOTE — ED Notes (Signed)
ED Provider at bedside. 

## 2017-08-28 ENCOUNTER — Encounter (INDEPENDENT_AMBULATORY_CARE_PROVIDER_SITE_OTHER): Payer: Self-pay | Admitting: Orthopaedic Surgery

## 2017-08-28 ENCOUNTER — Ambulatory Visit (INDEPENDENT_AMBULATORY_CARE_PROVIDER_SITE_OTHER): Payer: BLUE CROSS/BLUE SHIELD | Admitting: Orthopaedic Surgery

## 2017-08-28 DIAGNOSIS — M5440 Lumbago with sciatica, unspecified side: Secondary | ICD-10-CM

## 2017-08-28 NOTE — Progress Notes (Signed)
The patient is still dealing with chronic pain with his right hip and his left knee.  He had a right total hip arthroplasty and fixation of a tibial plateau fracture left knee.  I will send him for a three-phase bone scan to rule out prosthetic loosening.  He is been in the ER yesterday due to chest and shoulder pain.  I saw that his blood glucose on the 25th of this month was 475.  He is working on try to get all this under control.  On examination of his right hip and left knee both of these are normal in terms of fluid and full range of motion the discharge.  The three-phase bone scan showed no evidence of prosthetic loosening at all on the right side.  Due to his continued low back pain with radicular symptoms going down his legs at this point we will obtain an MRI of his lumbar spine to rule out any nerve compression.  This may get to the point where I recommend a pain specialist because of the loss of what else to try.  Obviously with his blood glucose running high we would not recommend any other steroids or injections.  Certainly we could try an arthroscopic intervention on the left knee if that pain persists.  We will see him back in 2 weeks to go over the MRI of his lumbar spine.  All questions concerns were answered and addressed.  We will continue to keep him out of work as well.

## 2017-09-07 ENCOUNTER — Ambulatory Visit
Admission: RE | Admit: 2017-09-07 | Discharge: 2017-09-07 | Disposition: A | Discharge status: Home / Self Care | Payer: BLUE CROSS/BLUE SHIELD | Source: Ambulatory Visit | Attending: Orthopaedic Surgery | Admitting: Orthopaedic Surgery

## 2017-09-07 DIAGNOSIS — M5440 Lumbago with sciatica, unspecified side: Secondary | ICD-10-CM

## 2017-09-11 ENCOUNTER — Ambulatory Visit (INDEPENDENT_AMBULATORY_CARE_PROVIDER_SITE_OTHER): Payer: BLUE CROSS/BLUE SHIELD | Admitting: Orthopaedic Surgery

## 2017-09-12 ENCOUNTER — Telehealth (INDEPENDENT_AMBULATORY_CARE_PROVIDER_SITE_OTHER): Payer: Self-pay | Admitting: Radiology

## 2017-09-12 ENCOUNTER — Encounter (INDEPENDENT_AMBULATORY_CARE_PROVIDER_SITE_OTHER): Payer: Self-pay

## 2017-09-12 NOTE — Telephone Encounter (Signed)
Patient aware note at front desk  

## 2017-09-12 NOTE — Telephone Encounter (Signed)
Patient called to be worked in today, missed appointment yesterday due to being sick.  No appointments available, re-scheduled for 09/18/17 follow up.  Patient does request a continued out of work note until next appointment due to missing appointment yesterday.  Call back # [phone] when ready.

## 2017-09-18 ENCOUNTER — Ambulatory Visit (INDEPENDENT_AMBULATORY_CARE_PROVIDER_SITE_OTHER): Payer: BLUE CROSS/BLUE SHIELD | Admitting: Physician Assistant

## 2017-09-18 ENCOUNTER — Encounter (INDEPENDENT_AMBULATORY_CARE_PROVIDER_SITE_OTHER): Payer: Self-pay | Admitting: Physician Assistant

## 2017-09-18 DIAGNOSIS — M5440 Lumbago with sciatica, unspecified side: Secondary | ICD-10-CM

## 2017-09-18 NOTE — Progress Notes (Signed)
HPI [name] returns today to go over the MRI of his lumbar spine.  Continues to have severe back pain.  Pain is worse whenever he is up and active he rates his back pain to be 5-7 out of 10 pain.  He has constant pain in his low back 3 out of 10 pain.  States that he has pain that radiates from his back to the anterior right thigh.  Some numbness anterior right thigh.  Never goes below the knee.  His pain is also worse with prolonged standing.  He is tried Advil muscle relaxers without any real relief.  His diabetes been under poor control he states he is working on gaining control of this reports that his glucose checks at home is been in the 170s.  MRI lumbar spine dated 09/07/2017 is reviewed with the patient today using a lumbar spine model for visualization.  MRI showed mild degenerative changes throughout the lumbar spine with moderate right and mild left neuroforaminal stenosis at L5-S1 which was felt to possibly effect bilateral exiting L5 nerve roots.  L4-5 mild bilateral facet arthropathy is also noted.  Physical exam: Negative straight leg raise bilaterally.  Hamstrings are tight bilaterally.  He is able to walk on his heels and tiptoes.  He comes within an inch of [place] to touch his toes.  He has good extension of lumbar spine without significant pain.  Tenderness in the lower lumbar spine with palpation in the right paraspinous region.  5 out of 5 strength throughout lower extremities against resistance.  Plan: Like for him to see his primary care doctor as scheduled next 2 weeks and if his glucose levels are under better control at that time we could send him for epidural steroid injection/facet injection at L5-S1.  He is failed conservative treatment which includes time and physical therapy along with medications.  We will keep him out of work until follow-up in approximately 2 weeks after the epidural steroid injection.

## 2017-09-26 ENCOUNTER — Telehealth (INDEPENDENT_AMBULATORY_CARE_PROVIDER_SITE_OTHER): Payer: Self-pay | Admitting: Orthopaedic Surgery

## 2017-09-26 NOTE — Telephone Encounter (Signed)
[employer] received records for this patient but they needed them starting 3/9, that was the patients MRI so I told him the only office notes would be from 3/20 which he said they received on 3/26. He stated they would need a note stating he hasnt been seen since the previous date. Avinash with [employer] Fax # [phone]

## 2017-09-30 NOTE — Telephone Encounter (Signed)
Awesome, thank you much

## 2017-09-30 NOTE — Telephone Encounter (Signed)
This may be for you? 

## 2017-09-30 NOTE — Telephone Encounter (Signed)
I faxed them another form for him to [place] on Friday, so this should be done.

## 2017-10-02 ENCOUNTER — Ambulatory Visit (INDEPENDENT_AMBULATORY_CARE_PROVIDER_SITE_OTHER): Payer: BLUE CROSS/BLUE SHIELD | Admitting: Orthopaedic Surgery

## 2017-10-02 ENCOUNTER — Encounter (INDEPENDENT_AMBULATORY_CARE_PROVIDER_SITE_OTHER): Payer: Self-pay | Admitting: Orthopaedic Surgery

## 2017-10-02 ENCOUNTER — Other Ambulatory Visit (INDEPENDENT_AMBULATORY_CARE_PROVIDER_SITE_OTHER): Payer: Self-pay

## 2017-10-02 DIAGNOSIS — M545 Low back pain: Principal | ICD-10-CM

## 2017-10-02 DIAGNOSIS — M47816 Spondylosis without myelopathy or radiculopathy, lumbar region: Secondary | ICD-10-CM | POA: Diagnosis not present

## 2017-10-02 DIAGNOSIS — G8929 Other chronic pain: Secondary | ICD-10-CM

## 2017-10-02 NOTE — Progress Notes (Signed)
The patient comes in today with continued chronic low back pain.  He has had an MRI recently that did show significant facet arthritis at L5-S1 bilaterally with mild foraminal narrowing.  His pain is mainly to the right side.  It does radiate down his leg.  He has a history of hip replacement.  He also has a history of poorly controlled diabetes.  His last hemoglobin A1c was 11 he says his blood sugars been running over 150 recently.  On examination he does have pain with extension of the lumbar spine and somewhat flexion.  There is pain in the paraspinal muscles to but it does run down his leg.  He seems to have a positive straight leg raise on the right side.  At this point we will send in the next week to Dr. Alvester MorinNewton for a single injection to the right at L5-S1.  Most likely this needs to involve the nerve and not so much the facet joint based on what he is describing is a radicular symptoms.  He is going to work on blood glucose control over this next week.  I gave him a note to keep him out of work the next 2 weeks.  I will see him back myself in 4 weeks to see how he is done from the injection.

## 2017-10-17 ENCOUNTER — Encounter (INDEPENDENT_AMBULATORY_CARE_PROVIDER_SITE_OTHER): Payer: Self-pay

## 2017-10-17 ENCOUNTER — Encounter (INDEPENDENT_AMBULATORY_CARE_PROVIDER_SITE_OTHER): Payer: Self-pay | Admitting: Physical Medicine and Rehabilitation

## 2017-10-17 ENCOUNTER — Ambulatory Visit (INDEPENDENT_AMBULATORY_CARE_PROVIDER_SITE_OTHER): Payer: BLUE CROSS/BLUE SHIELD | Admitting: Physical Medicine and Rehabilitation

## 2017-10-17 ENCOUNTER — Telehealth (INDEPENDENT_AMBULATORY_CARE_PROVIDER_SITE_OTHER): Payer: Self-pay | Admitting: Orthopaedic Surgery

## 2017-10-17 ENCOUNTER — Ambulatory Visit (INDEPENDENT_AMBULATORY_CARE_PROVIDER_SITE_OTHER): Payer: BLUE CROSS/BLUE SHIELD

## 2017-10-17 VITALS — BP 169/109 | HR 85 | Temp 98.4°F

## 2017-10-17 DIAGNOSIS — M5416 Radiculopathy, lumbar region: Secondary | ICD-10-CM | POA: Diagnosis not present

## 2017-10-17 MED ORDER — BETAMETHASONE SOD PHOS & ACET 6 (3-3) MG/ML IJ SUSP
12.0000 mg | Freq: Once | INTRAMUSCULAR | Status: AC
  Administered 2017-10-17: 12 mg

## 2017-10-17 NOTE — Telephone Encounter (Signed)
Pt needs a work note to give employer to update his current work status

## 2017-10-17 NOTE — Telephone Encounter (Signed)
Written and put at front desk, patient aware

## 2017-10-17 NOTE — Patient Instructions (Signed)

## 2017-10-17 NOTE — Progress Notes (Signed)
.  Numeric Pain Rating Scale and Functional Assessment Average Pain 4   In the last MONTH (on 0-10 scale) has pain interfered with the following?  1. General activity like being  able to carry out your everyday physical activities such as walking, climbing stairs, carrying groceries, or moving a chair?  Rating(3)   +Driver, -BT, -Dye Allergies.  

## 2017-10-28 NOTE — Procedures (Signed)
Lumbosacral Transforaminal Epidural Steroid Injection - Sub-Pedicular Approach with Fluoroscopic Guidance  Patient: [name] [name]      Date of Birth: [birth year] MRN: [id number] PCP: [facility]      Visit Date: 10/17/2017   Universal Protocol:    Date/Time: 10/17/2017  Consent Given By: the patient  Position: PRONE  Additional Comments: Vital signs were monitored before and after the procedure. Patient was prepped and draped in the usual sterile fashion. The correct patient, procedure, and site was verified.   Injection Procedure Details:  Procedure Site One Meds Administered:  Meds ordered this encounter  Medications  . betamethasone acetate-betamethasone sodium phosphate (CELESTONE) injection 12 mg    Laterality: Right  Location/Site:  L5-S1  Needle size: 22 G  Needle type: Spinal  Needle Placement: Transforaminal  Findings:    -Comments: Excellent flow of contrast along the nerve and into the epidural space.  Procedure Details: After squaring off the end-plates to get a true AP view, the C-arm was positioned so that an oblique view of the foramen as noted above was visualized. The target area is just inferior to the "nose of the scotty dog" or sub pedicular. The soft tissues overlying this structure were infiltrated with 2-3 ml. of 1% Lidocaine without Epinephrine.  The spinal needle was inserted toward the target using a "trajectory" view along the fluoroscope beam.  Under AP and lateral visualization, the needle was advanced so it did not puncture dura and was located close the 6 O'Clock position of the pedical in AP tracterory. Biplanar projections were used to confirm position. Aspiration was confirmed to be negative for CSF and/or blood. A 1-2 ml. volume of Isovue-250 was injected and flow of contrast was noted at each level. Radiographs were obtained for documentation purposes.   After attaining the desired flow of contrast  documented above, a 0.5 to 1.0 ml test dose of 0.25% Marcaine was injected into each respective transforaminal space.  The patient was observed for 90 seconds post injection.  After no sensory deficits were reported, and normal lower extremity motor function was noted,   the above injectate was administered so that equal amounts of the injectate were placed at each foramen (level) into the transforaminal epidural space.   Additional Comments:  The patient tolerated the procedure well Dressing: Band-Aid    Post-procedure details: Patient was observed during the procedure. Post-procedure instructions were reviewed.  Patient left the clinic in stable condition.

## 2017-10-28 NOTE — Progress Notes (Signed)
[name] - 57 y.o. male MRN [id number]  Date of birth: April 01, 1961  Office Visit Note: Visit Date: 10/17/2017 [facility]CP: [facility] Referred by: [facility]  Subjective: Chief Complaint  Patient presents with  . Lower Back - Pain  . Right Leg - Pain   HPI: [name] [name] is a 57 year old gentleman comes in today with chronic worsening severe low back and right radicular leg pain is been going on for approximately 2 years off and on but with several months of worsening.  MRI findings are fairly moderate spondylitic changes throughout with small listhesis of L5 on S1 with moderate right and mild left foraminal narrowing.  We are going to complete a diagnostic and hopefully therapeutic right L5 transforaminal epidural steroid injection.  Consideration should be given to intralaminar approach as well.  Back pain may be related to facet arthropathy.  The injection  will be diagnostic and hopefully therapeutic. The patient has failed conservative care including time, medications and activity modification.   ROS Otherwise per HPI.  Assessment & Plan: Visit Diagnoses:  1. Lumbar radiculopathy     Plan: No additional findings.   Meds & Orders:  Meds ordered this encounter  Medications  . betamethasone acetate-betamethasone sodium phosphate (CELESTONE) injection 12 mg    Orders Placed This Encounter  Procedures  . XR C-ARM NO REPORT  . Epidural Steroid injection    Follow-up: Return if symptoms worsen or fail to improve.   Procedures: No procedures performed  Lumbosacral Transforaminal Epidural Steroid Injection - Sub-Pedicular Approach with Fluoroscopic Guidance  Patient: [name] [name]      Date of Birth: 1960-12-17 MRN: [id number] [facility]CP: [facility]      Visit Date: 10/17/2017   Universal Protocol:    Date/Time: 10/17/2017  Consent Given By: the patient  Position: PRONE  Additional Comments: Vital  signs were monitored before and after the procedure. Patient was prepped and draped in the usual sterile fashion. The correct patient, procedure, and site was verified.   Injection Procedure Details:  Procedure Site One Meds Administered:  Meds ordered this encounter  Medications  . betamethasone acetate-betamethasone sodium phosphate (CELESTONE) injection 12 mg    Laterality: Right  Location/Site:  L5-S1  Needle size: 22 G  Needle type: Spinal  Needle Placement: Transforaminal  Findings:    -Comments: Excellent flow of contrast along the nerve and into the epidural space.  Procedure Details: After squaring off the end-plates to get a true AP view, the C-arm was positioned so that an oblique view of the foramen as noted above was visualized. The target area is just inferior to the "nose of the scotty dog" or sub pedicular. The soft tissues overlying this structure were infiltrated with 2-3 ml. of 1% Lidocaine without Epinephrine.  The spinal needle was inserted toward the target using a "trajectory" view along the fluoroscope beam.  Under AP and lateral visualization, the needle was advanced so it did not puncture dura and was located close the 6 O'Clock position of the pedical in AP tracterory. Biplanar projections were used to confirm position. Aspiration was confirmed to be negative for CSF and/or blood. A 1-2 ml. volume of Isovue-250 was injected and flow of contrast was noted at each level. Radiographs were obtained for documentation purposes.   After attaining the desired flow of contrast documented above, a 0.5 to 1.0 ml test dose of 0.25% Marcaine was injected into each respective transforaminal space.  The patient was  observed for 90 seconds post injection.  After no sensory deficits were reported, and normal lower extremity motor function was noted,   the above injectate was administered so that equal amounts of the injectate were placed at each foramen (level) into the  transforaminal epidural space.   Additional Comments:  The patient tolerated the procedure well Dressing: Band-Aid    Post-procedure details: Patient was observed during the procedure. Post-procedure instructions were reviewed.  Patient left the clinic in stable condition.    Clinical History: MRI LUMBAR SPINE WITHOUT CONTRAST  TECHNIQUE: Multiplanar, multisequence MR imaging of the lumbar spine was performed. No intravenous contrast was administered.  COMPARISON:  Lumbar spine x-rays dated August 05, 2017. MRI lumbar spine report dated December 05, 2012.  FINDINGS: Segmentation:  Standard.  Alignment:  Trace anterolisthesis at L5-S1.  Vertebrae: No fracture, evidence of discitis, or focal bone lesion. Large hemangioma replacing the entire L3 vertebral body.  Conus medullaris and cauda equina: Conus extends to the L1 level. Conus and cauda equina appear normal.  Paraspinal and other soft tissues: Negative.  Disc levels:  T11-T12: Only seen on the sagittal images.  Negative.  T12-L1:  Small left paracentral disc protrusion.  No stenosis.  L1-L2:  Small left paracentral disc protrusion.  No stenosis.  L2-L3: Small central disc protrusion and annular fissure. No stenosis.  L3-L4:  Negative.  L4-L5: Trace disc bulge and mild bilateral facet arthropathy. No stenosis.  L5-S1: Moderate bilateral facet arthropathy. Moderate right and mild left neuroforaminal stenosis. No spinal canal stenosis.  IMPRESSION: 1. Mild degenerative changes throughout the lumbar spine as described above. Moderate right and mild left neuroforaminal stenosis at L5-S1 could affect the bilateral exiting L5 nerve roots.   Electronically Signed   By: [name] [name] M.D.   On: 09/07/2017 16:54   He reports that he has been smoking cigars.  He has been smoking about 0.50 packs per day. He has never used smokeless tobacco. No results for input(s): HGBA1C, LABURIC in the last  8760 hours.  Objective:  VS:  HT:    WT:   BMI:     BP:(!) 169/109  HR:85bpm  TEMP:98.4 F (36.9 C)(Oral)  RESP:97 % Physical Exam  Ortho Exam Imaging: No results found.  Past Medical/Family/Surgical/Social History: Medications & Allergies reviewed per EMR, new medications updated. Patient Active Problem List   Diagnosis Date Noted  . Facet arthritis of lumbar region 10/02/2017  . Chronic bilateral low back pain 08/05/2017  . History of total right hip replacement 08/05/2017  . Pain of right hip joint 08/05/2017  . Chronic pain of left knee 08/05/2017  . Hypoxemia 05/17/2013  . Type II or unspecified type diabetes mellitus without mention of complication, uncontrolled 05/17/2013  . Thrombocytopenia, unspecified (HCC) 05/17/2013  . Unspecified essential hypertension 05/17/2013  . Degenerative arthritis of right hip 05/15/2013  . Inguinal strain 09/02/2012   Past Medical History:  Diagnosis Date  . Arthritis   . Diabetes mellitus   . Hypertension    Family History  Problem Relation Age of Onset  . Diabetes Mother   . Stroke Mother   . Leukemia Maternal Aunt    Past Surgical History:  Procedure Laterality Date  . ANKLE SURGERY  1996   L ankle  . CLAVICLE SURGERY  2010  . JOINT REPLACEMENT     metal plate in lft knee  . 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   Procedure: RIGHT TOTAL HIP ARTHROPLASTY ANTERIOR APPROACH;  Surgeon: Kathryne Hitch, MD;  Location: [facility];  Service: Orthopedics;  Laterality: Right;  . UMBILICAL HERNIA REPAIR  2009   Social History   Occupational History  . Not on file  Tobacco Use  . Smoking status: Current Some Day Smoker    Packs/day: 0.50    Types: Cigars  . Smokeless tobacco: Never Used  . Tobacco comment: a cigar occasionally  Substance and Sexual Activity  . Alcohol use: Yes    Comment: beer - occasionally  . Drug use: No  . Sexual activity: Not on file

## 2017-10-30 ENCOUNTER — Encounter (INDEPENDENT_AMBULATORY_CARE_PROVIDER_SITE_OTHER): Payer: Self-pay | Admitting: Orthopaedic Surgery

## 2017-10-30 ENCOUNTER — Ambulatory Visit (INDEPENDENT_AMBULATORY_CARE_PROVIDER_SITE_OTHER): Payer: BLUE CROSS/BLUE SHIELD | Admitting: Orthopaedic Surgery

## 2017-10-30 DIAGNOSIS — M545 Low back pain: Secondary | ICD-10-CM

## 2017-10-30 DIAGNOSIS — G8929 Other chronic pain: Secondary | ICD-10-CM | POA: Diagnosis not present

## 2017-10-30 NOTE — Progress Notes (Signed)
The patient is following up after having a right-sided L5-S1 injection by Dr. Alvester Morin.  He has been out of work for at least between 11 to 15 weeks.  He is doing better overall.  He still gets some pain in his back but is not severe.  On exam he appears very comfortable.  He has a negative straight leg raise bilaterally.  His exam is significantly unremarkable.  He does have some pain with flexion extension of his lumbar spine.  I encouraged him to get back to work at this standpoint thing that has been out for so long.  Really have no other recommendations for him other than continue to work on lifestyle modifications to get his blood glucose down and work on core strengthening exercises and weight loss.  We can certainly see him back in 3 months to see how is doing overall.  I did give him a work note allowing him to return to work starting this coming Monday, May 6.

## 2017-12-05 IMAGING — MR MR HEAD W/O CM
9 of 10 series · 36 of 48 positions shown · non-contrast
Comparison: 05/06/2011 CT head

CLINICAL DATA: 55 y/o M; intermittent dizziness for over a month
progressively getting worse. Nausea, vomiting, and headache onset
today.

EXAM:
MRI HEAD WITHOUT CONTRAST
TECHNIQUE: Multiplanar, multiecho pulse sequences of the brain and surrounding
structures were obtained without intravenous contrast.

[Series 3: DWI · axial · 3.0mm · 1.09mm/px · z∈[-112,+32]mm · 9 of 100 slices shown (1 of 4)]
[im 1/100]
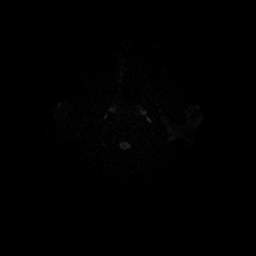
[im 13/100]
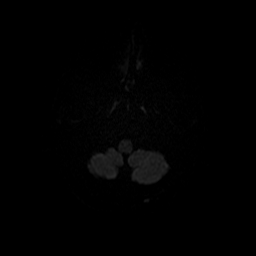
[im 25/100]
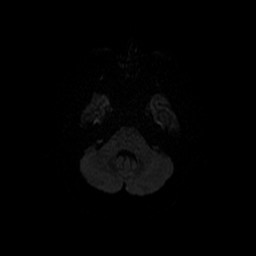
[im 38/100]
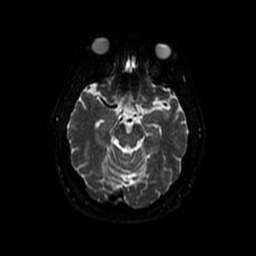
[im 50/100]
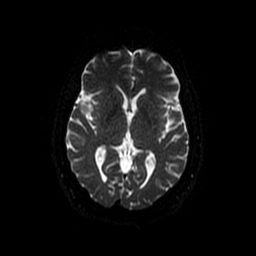
[im 62/100]
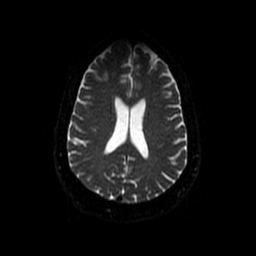
[im 75/100]
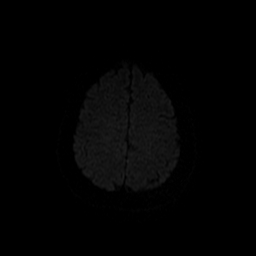
[im 87/100]
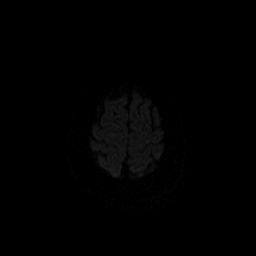
[im 100/100]
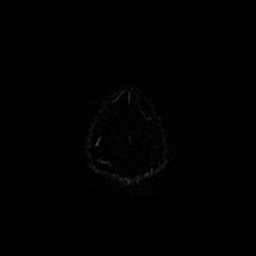

[Series 4: T1 · sagittal · 5.0mm · 0.47mm/px · 1 of 23 slices shown]
[im 1/23]
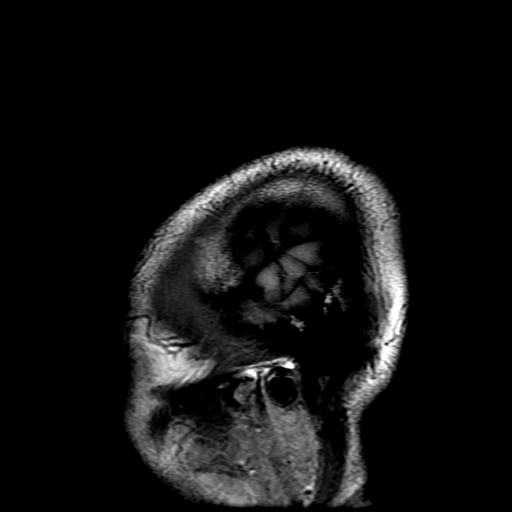

[Series 5: T2 · axial · 5.0mm · 0.43mm/px · z∈[-120,+25]mm · 3 of 26 slices shown (1 of 2)]
[im 1/26]
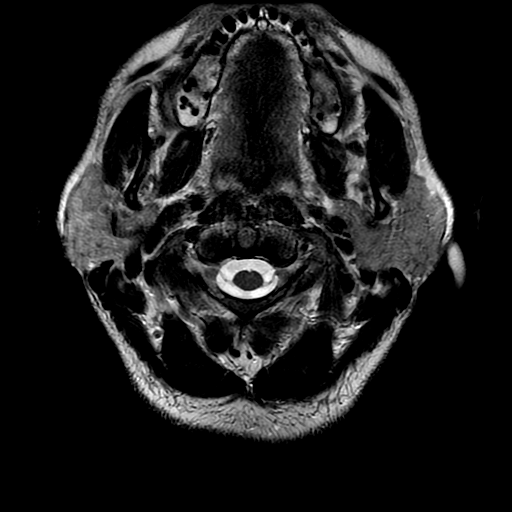
[im 13/26]
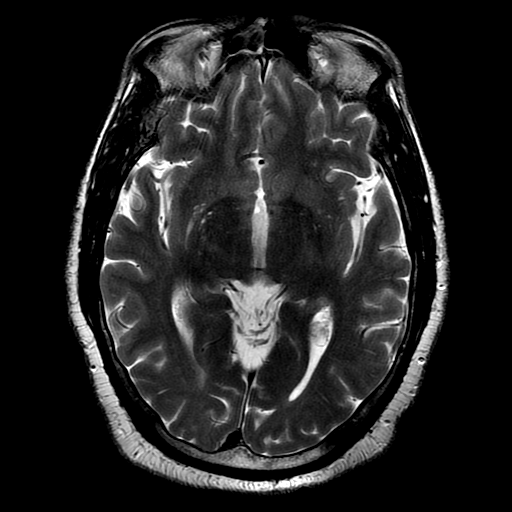
[im 26/26]
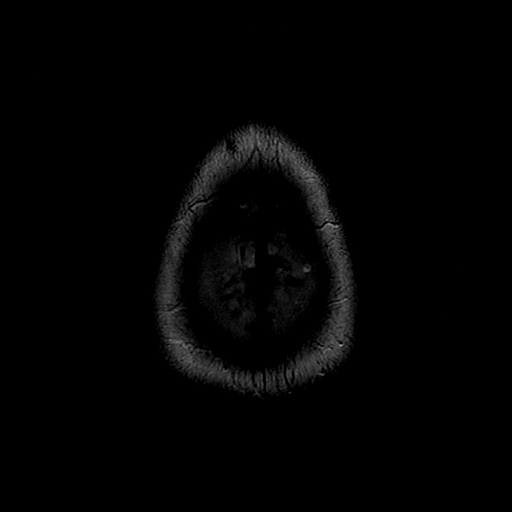

[Series 6: DWI · coronal · 5.0mm · 1.09mm/px · 7 of 74 slices shown (2 of 4)]
[im 1/74]
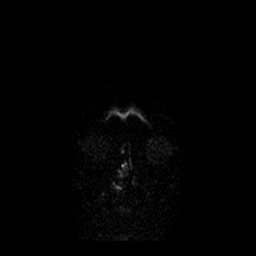
[im 13/74]
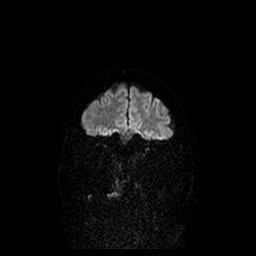
[im 25/74]
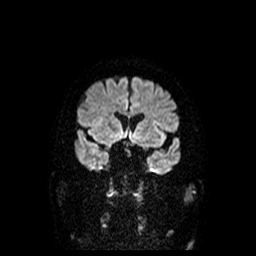
[im 37/74]
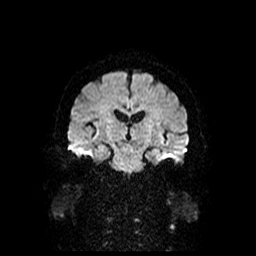
[im 49/74]
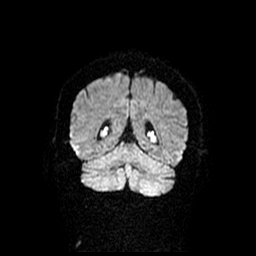
[im 61/74]
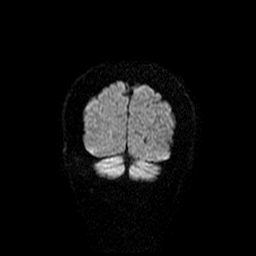
[im 74/74]
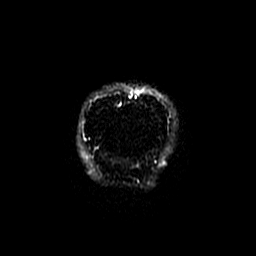

[Series 7: FLAIR · axial · 5.0mm · 0.43mm/px · z∈[-120,+25]mm · 3 of 26 slices shown]
[im 1/26]
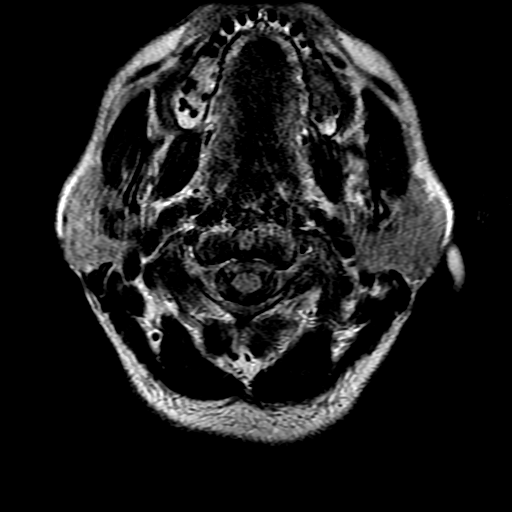
[im 13/26]
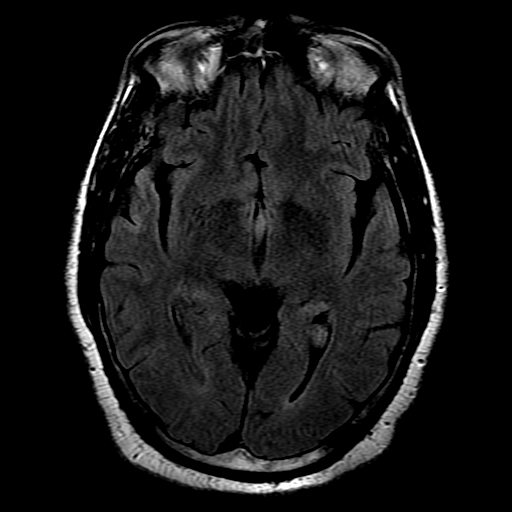
[im 26/26]
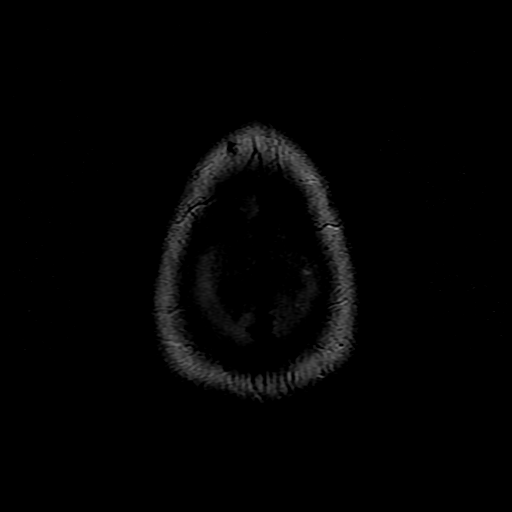

[Series 8: ax mpgr · axial · 5.0mm · 0.43mm/px · 1 of 26 slices shown]
[im 1/26]
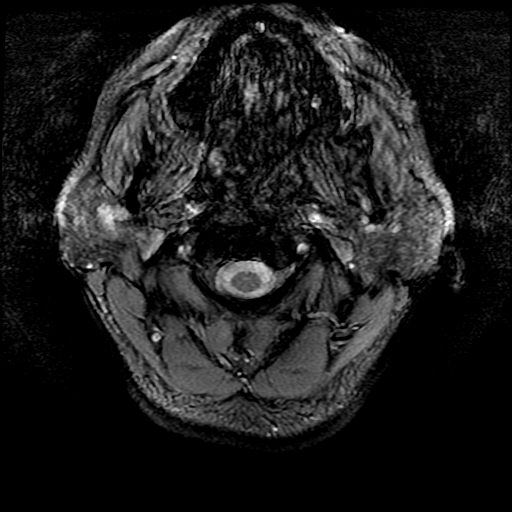

[Series 10: T2 · coronal · 5.0mm · 0.39mm/px · 3 of 28 slices shown (2 of 2)]
[im 1/28]
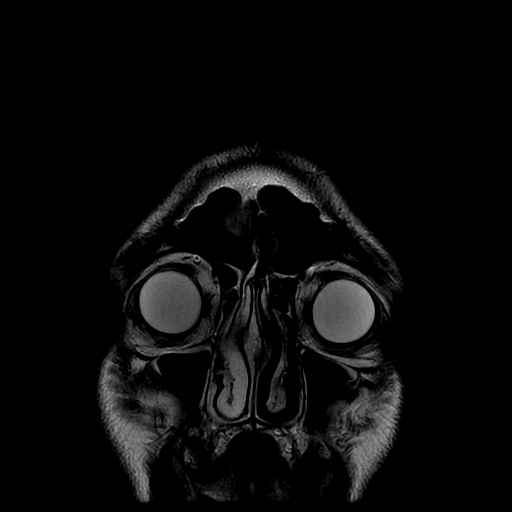
[im 14/28]
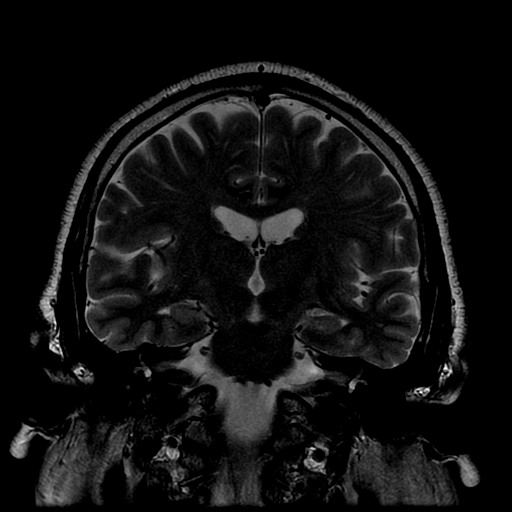
[im 28/28]
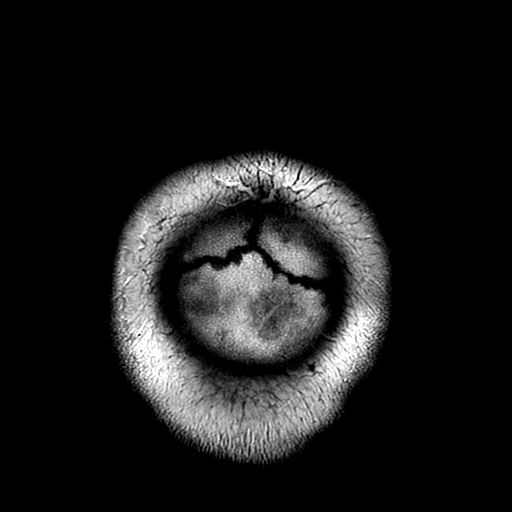

[Series 300: DWI · axial · 3.0mm · 1.09mm/px · z∈[-112,+32]mm · 5 of 50 slices shown (3 of 4)]
[im 1/50]
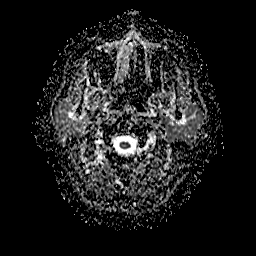
[im 13/50]
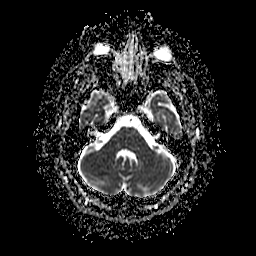
[im 25/50]
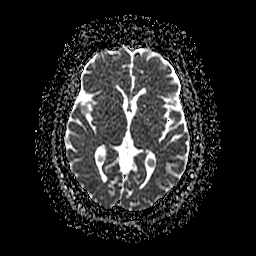
[im 37/50]
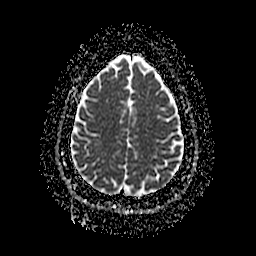
[im 50/50]
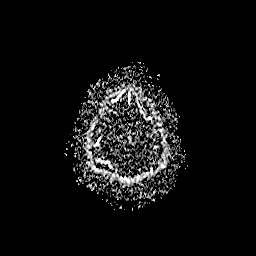

[Series 600: DWI · coronal · 5.0mm · 1.09mm/px · 4 of 37 slices shown (4 of 4)]
[im 1/37]
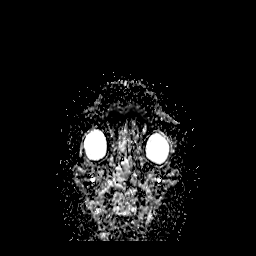
[im 13/37]
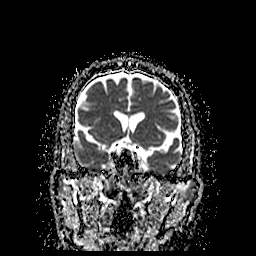
[im 25/37]
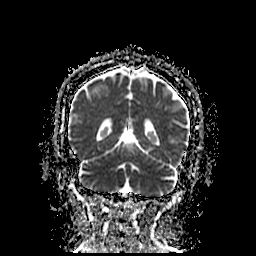
[im 37/37]
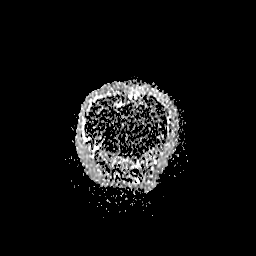

[36 of 48 positions shown; findings below may reference images not displayed]

FINDINGS: Brain: Questionable punctate focus of reduced diffusion within the
central midbrain (series 3, image 19 and series 300, image 19). No
additional focus of reduced diffusion. No abnormal susceptibility
hypointensity. Few scattered punctate foci of T2 FLAIR hyperintense
signal abnormality are present in subcortical white matter
compatible with minimal chronic microvascular ischemic change. No
focal mass effect or herniation. Normal ventricle size.

Vascular: Normal flow voids.

Skull and upper cervical spine: Normal marrow signal.

Sinuses/Orbits: Mild paranasal sinus mucosal thickening. No
significant abnormal signal of mastoid air cells. Normal orbits.

Other: None.
IMPRESSION: 1. Questionable punctate focus of reduced diffusion within central
midbrain. This is suspected to be artifactual given duration of
symptoms, less likely acute small vessel infarction. No additional
potential evidence for ischemia.
2. Minimal chronic microvascular ischemic changes.
3. Mild paranasal sinus disease.
These results were called by telephone at the time of interpretation
on 01/05/2017 at [DATE] to Dr. CHE WAH BILLIE , who verbally
acknowledged these results.

By: Comfort Kan M.D.

## 2018-01-29 ENCOUNTER — Ambulatory Visit (INDEPENDENT_AMBULATORY_CARE_PROVIDER_SITE_OTHER): Payer: BLUE CROSS/BLUE SHIELD | Admitting: Orthopaedic Surgery

## 2018-07-26 IMAGING — DX DG CHEST 2V
2 series · 2 of 2 positions shown · non-contrast
Comparison: May 19, 2015

CLINICAL DATA: Shortness of breath tonight.

EXAM:
CHEST  2 VIEW

[chest pa]
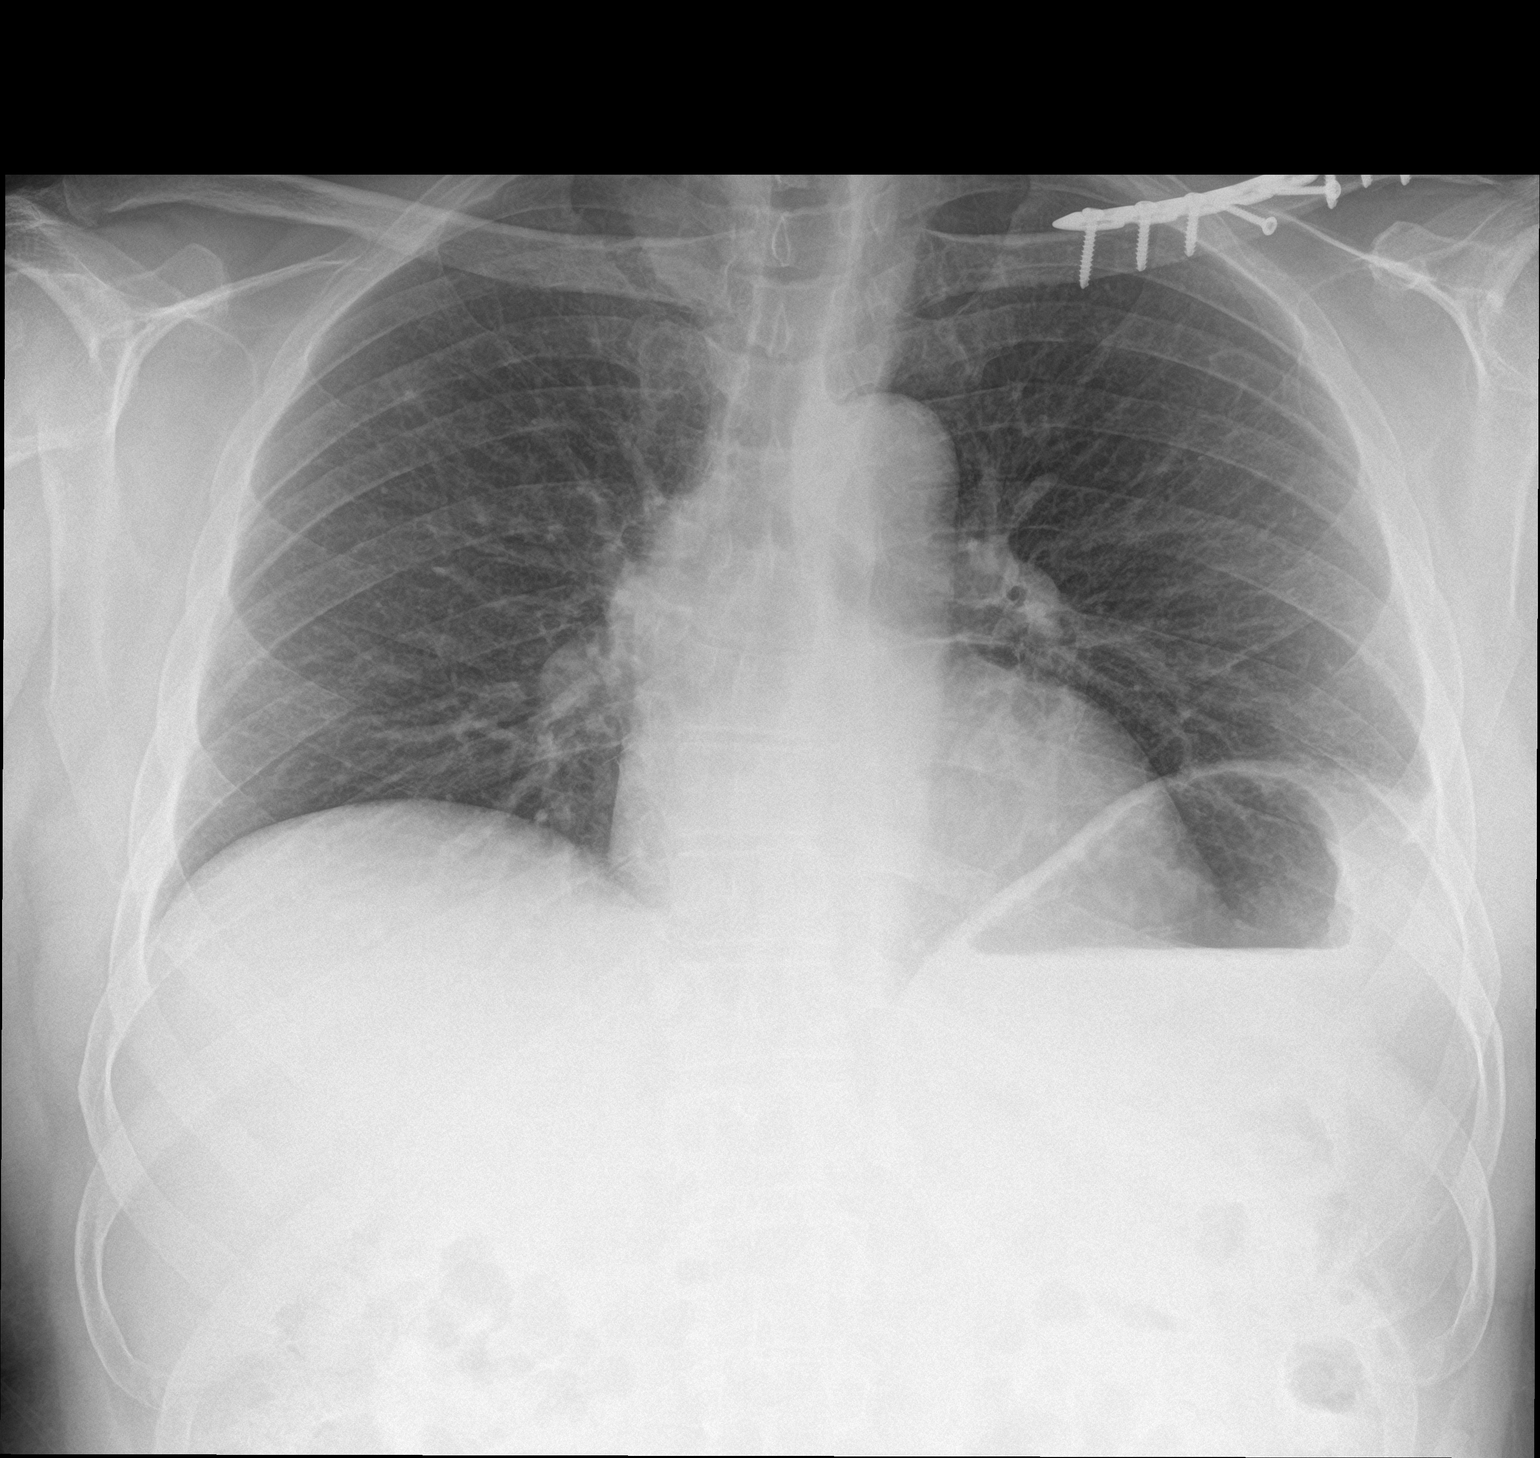

[chest lat]
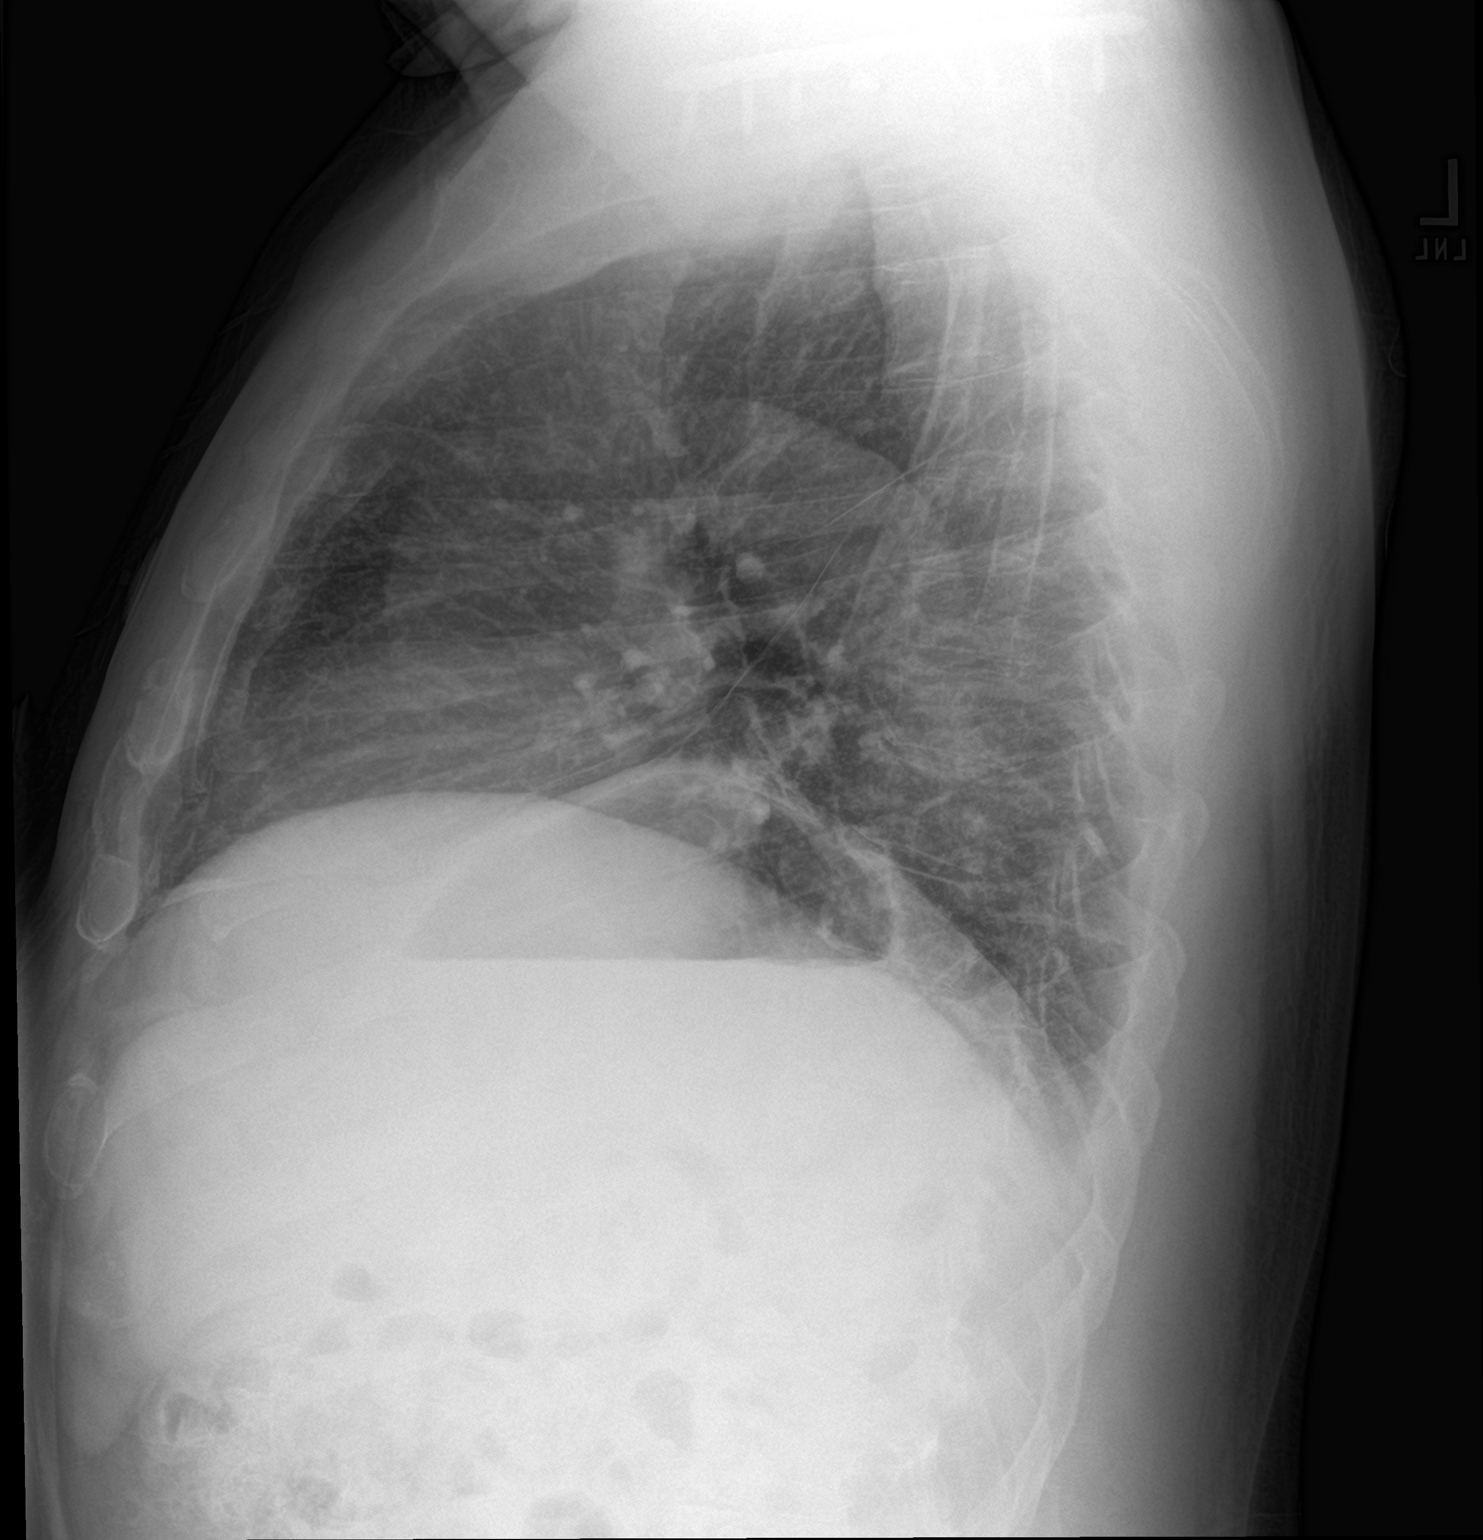

[2 of 2 positions shown; findings below may reference images not displayed]

FINDINGS: The heart size and mediastinal contours are stable. There is no
focal infiltrate, pulmonary edema, or pleural effusion. Minimal
atelectasis of left lung base is noted. The visualized skeletal
structures are stable.
IMPRESSION: Minimal atelectasis of left lung base.  Lungs are otherwise clear.

## 2018-07-27 IMAGING — CT CT ANGIO CHEST
3 of 7 series · 17 of 46 positions shown · IV contrast (iopamidol)
Comparison: Chest radiograph dated 08/26/2017

ADDENDUM:
There is no aortic aneurysm or dissection.
CLINICAL DATA: 56-year-old male with chest pain and back pain.

EXAM:
CT ANGIOGRAPHY CHEST WITH CONTRAST
TECHNIQUE: Multidetector CT imaging of the chest was performed using the
standard protocol during bolus administration of intravenous
contrast. Multiplanar CT image reconstructions and MIPs were
obtained to evaluate the vascular anatomy.
CONTRAST:  100mL [id number] IOPAMIDOL ([id number]) INJECTION 76%

[Series 6: arterial · axial · arterial · 0.80mm/px · z∈[+1131,+1331]mm · 11 of 121 slices shown]
[im 11/121  lung]
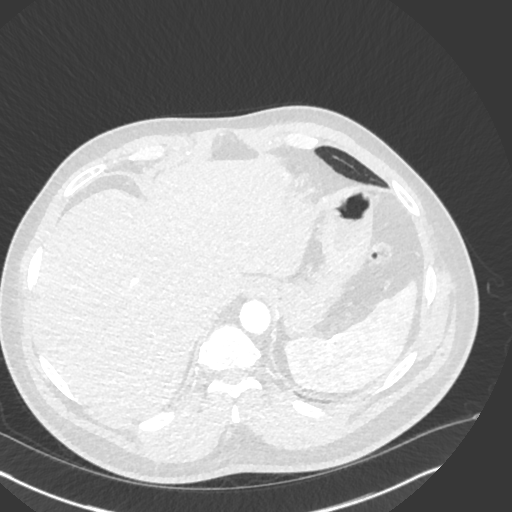
[im 21/121  soft-tissue]
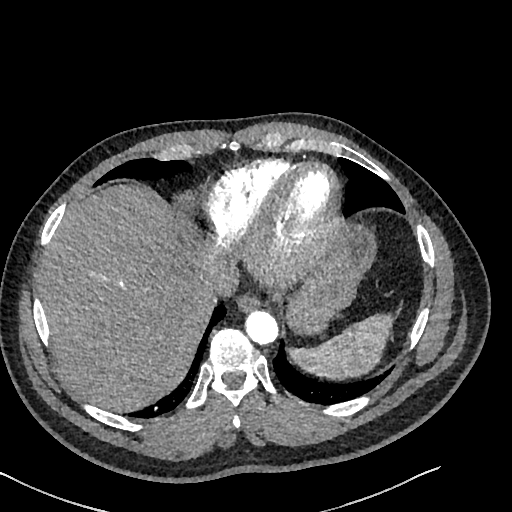
[im 31/121  lung]
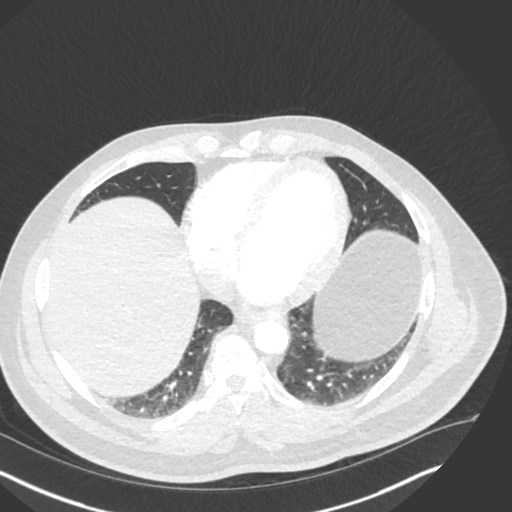
[im 41/121  soft-tissue]
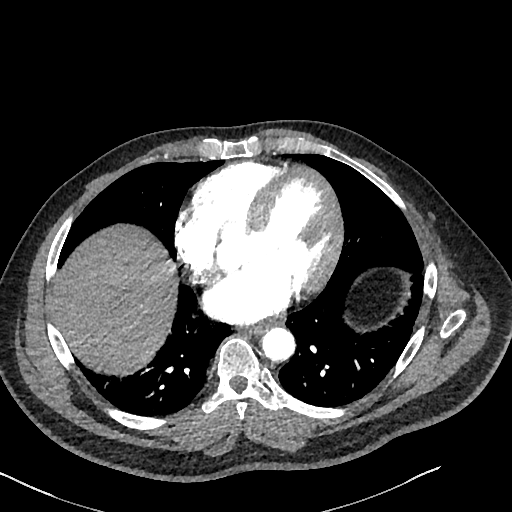
[im 51/121  lung]
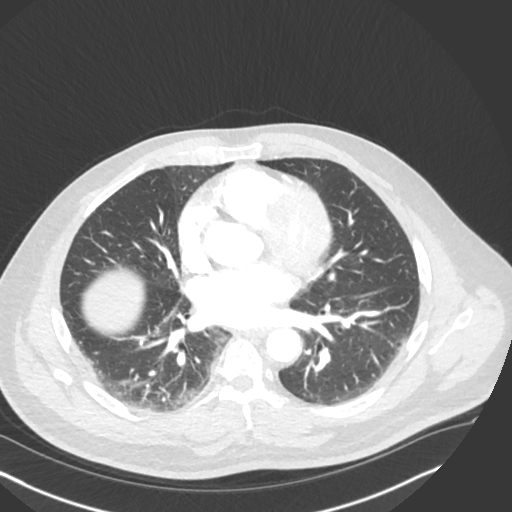
[im 61/121  soft-tissue]
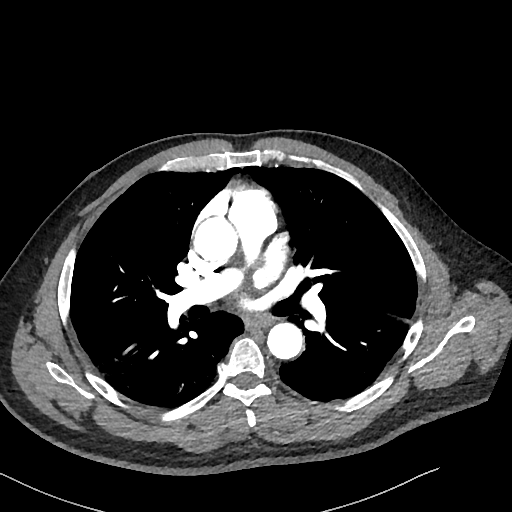
[im 71/121  lung]
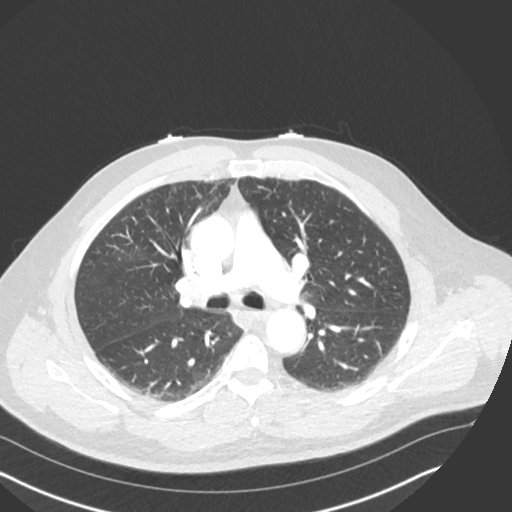
[im 81/121  soft-tissue]
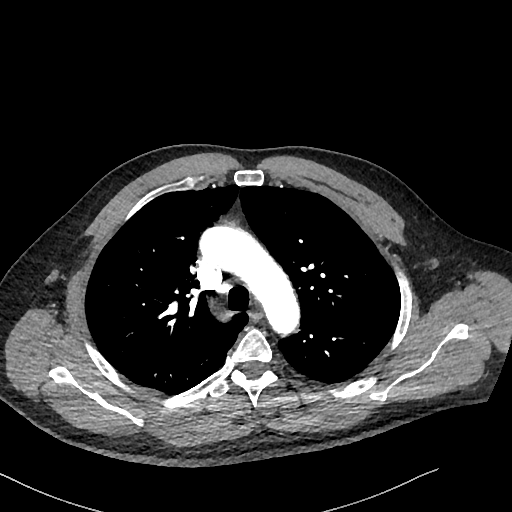
[im 91/121  lung]
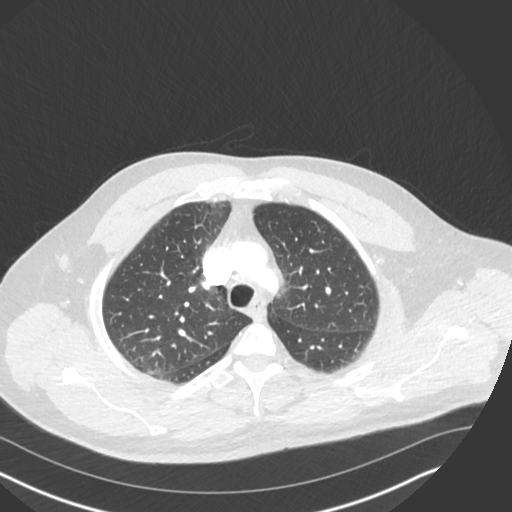
[im 101/121  soft-tissue]
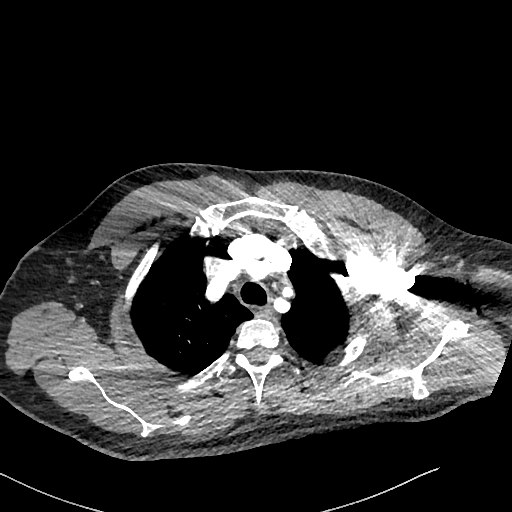
[im 111/121  lung]
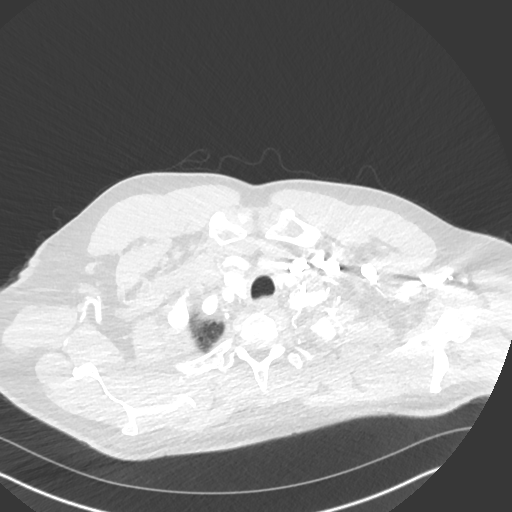

[Series 7: lung · axial · 0.80mm/px · z∈[+1131,+1191]mm · 3 of 121 slices shown]
[im 11/121  soft-tissue]
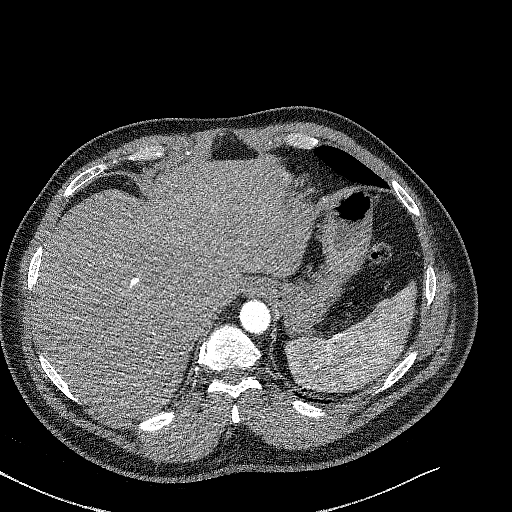
[im 31/121  soft-tissue]
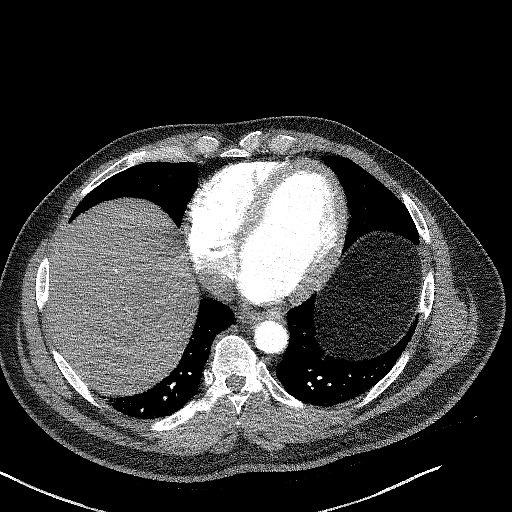
[im 41/121  soft-tissue]
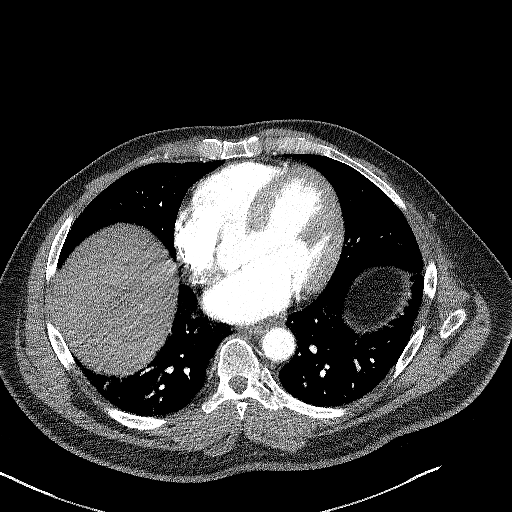

[Series 9: cor · coronal · 0.48mm/px · 3 of 129 slices shown]
[im 33/129  soft-tissue]
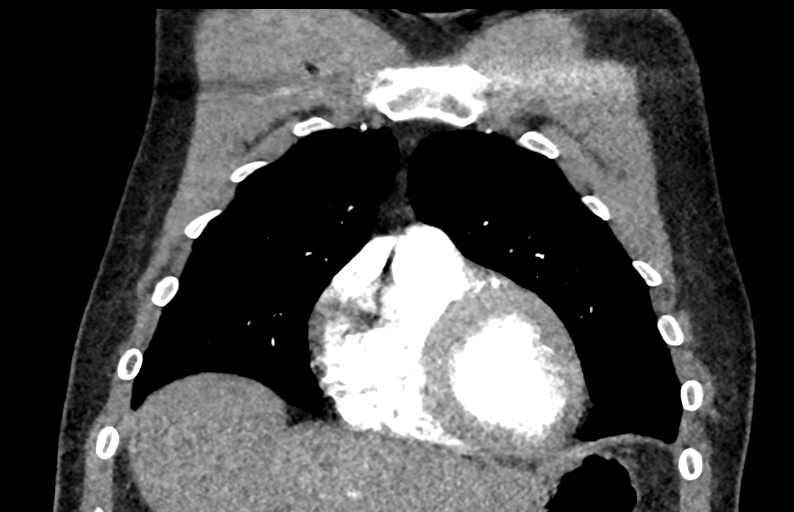
[im 65/129  soft-tissue]
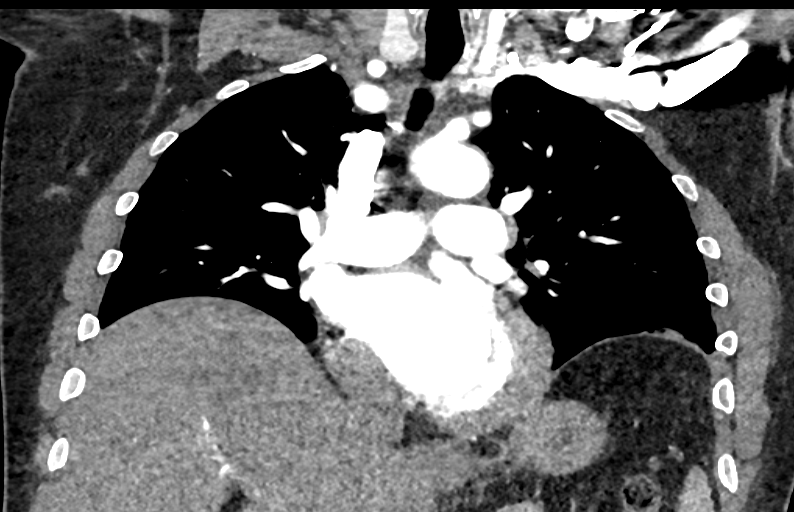
[im 97/129  soft-tissue]
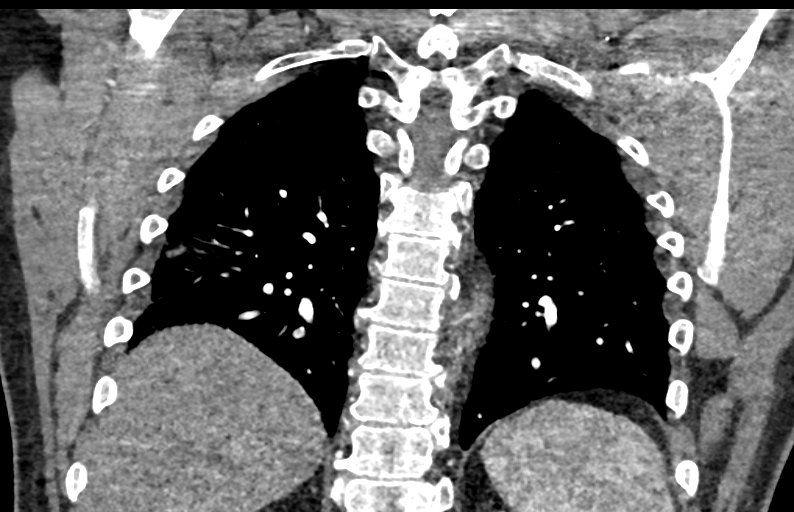

[17 of 46 positions shown; findings below may reference images not displayed]

FINDINGS: Cardiovascular: There is no cardiomegaly or pericardial effusion.
The thoracic aorta is unremarkable. The origins of the great vessels
of the aortic arch are patent. There is a common origin of the right
brachiocephalic trunk and left common carotid artery. There is no CT
evidence of pulmonary embolism.

Mediastinum/Nodes: No hilar or mediastinal adenopathy. Esophagus and
the thyroid gland are grossly unremarkable. No mediastinal fluid
collection or hematoma.

Lungs/Pleura: There are bibasilar linear atelectasis. There is no
focal consolidation, pleural effusion, or pneumothorax. The central
airways are patent.

Upper Abdomen: There are multiple stones in the gallbladder. The
visualized upper abdomen is otherwise unremarkable.

Musculoskeletal: No chest wall abnormality. Prior left clavicular
fixation. No acute osseous pathology.

Review of the MIP images confirms the above findings.
IMPRESSION: 1. No CT evidence of pulmonary embolism.
2. Bibasilar subsegmental atelectasis.  No focal consolidation.
3. Cholelithiasis.

## 2018-08-07 IMAGING — MR MR LUMBAR SPINE W/O CM
4 of 5 series · 22 of 48 positions shown · non-contrast
Comparison: Lumbar spine x-rays dated August 05, 2017. MRI lumbar
spine report dated December 05, 2012.

CLINICAL DATA: Chronic low back pain for the past 6 years with
weakness and numbness in both legs.

EXAM:
MRI LUMBAR SPINE WITHOUT CONTRAST
TECHNIQUE: Multiplanar, multisequence MR imaging of the lumbar spine was
performed. No intravenous contrast was administered.

[Series 3: T2 · sagittal · 4.0mm · 0.73mm/px · 6 of 17 slices shown (1 of 2)]
[im 1/17]
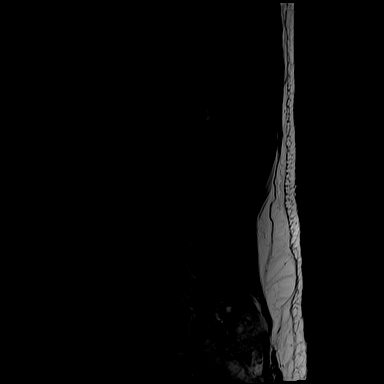
[im 4/17]
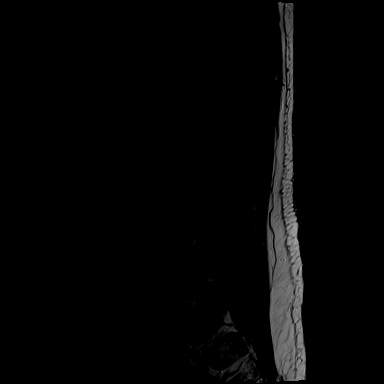
[im 7/17]
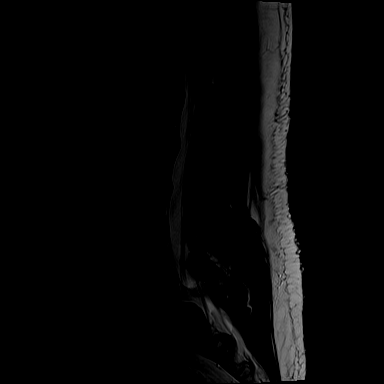
[im 10/17]
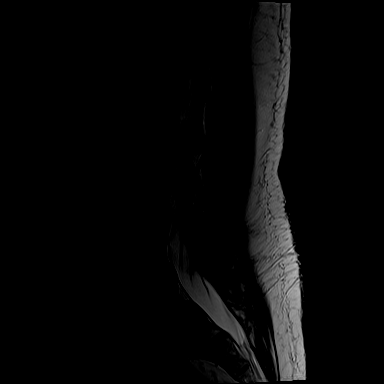
[im 13/17]
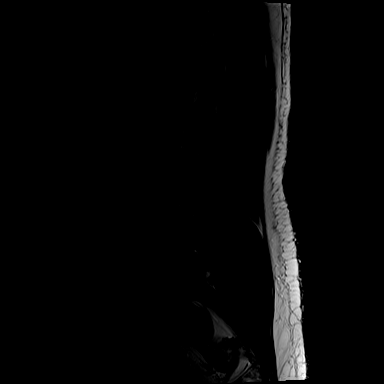
[im 17/17]
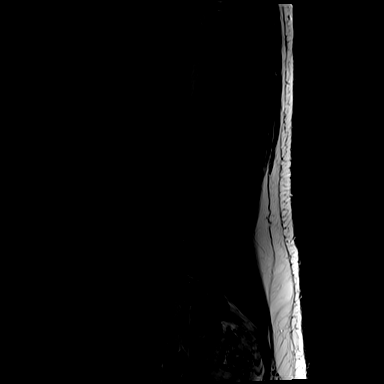

[Series 5: T1 · sagittal · 4.0mm · 0.73mm/px · 4 of 17 slices shown (1 of 2)]
[im 1/17]
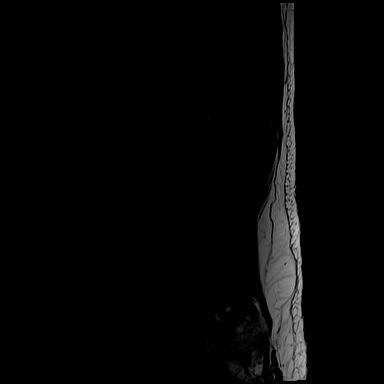
[im 4/17]
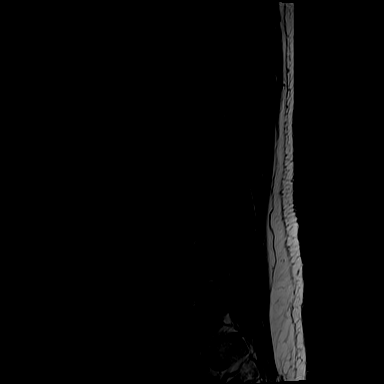
[im 10/17]
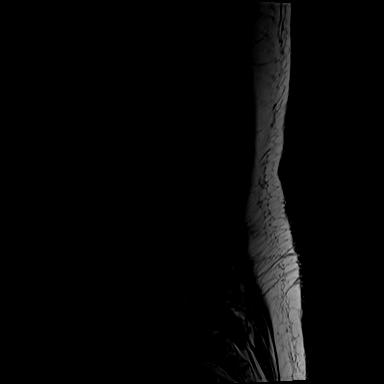
[im 17/17]
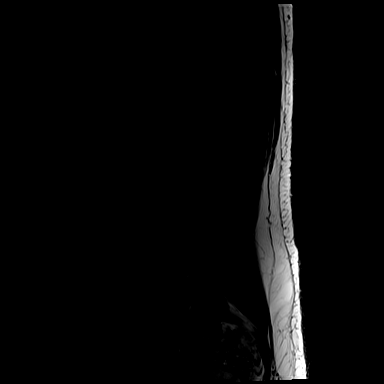

[Series 6: T2 · axial · 4.0mm · 0.39mm/px · z∈[-96,+115]mm · 9 of 44 slices shown (2 of 2)]
[im 1/44]
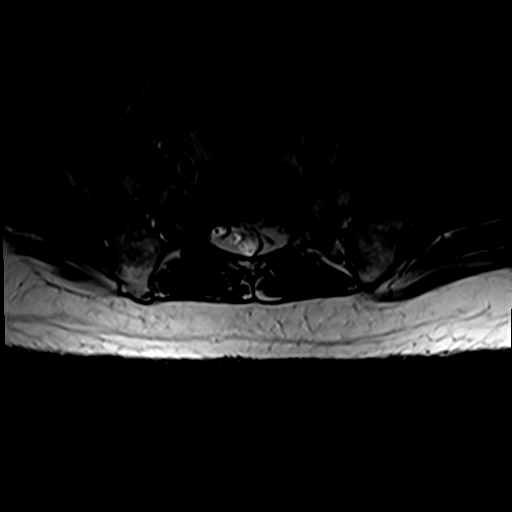
[im 7/44]
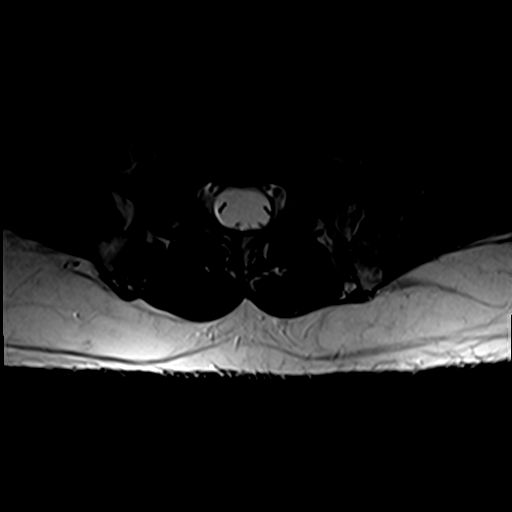
[im 13/44]
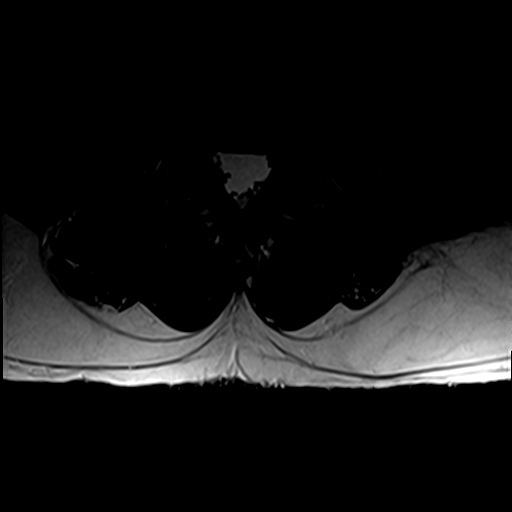
[im 19/44]
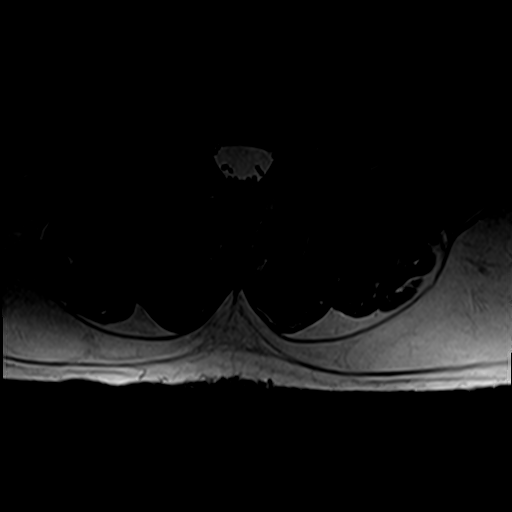
[im 22/44]
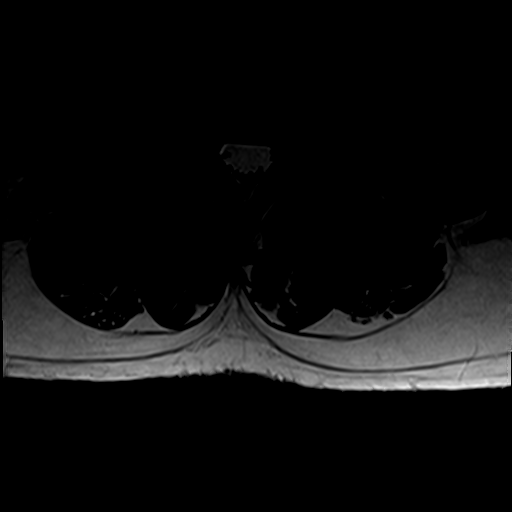
[im 25/44]
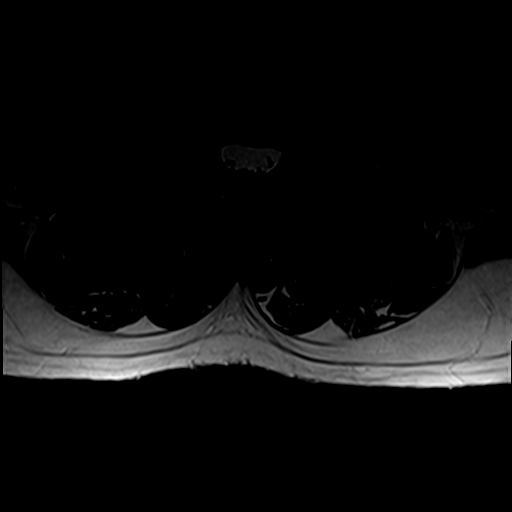
[im 31/44]
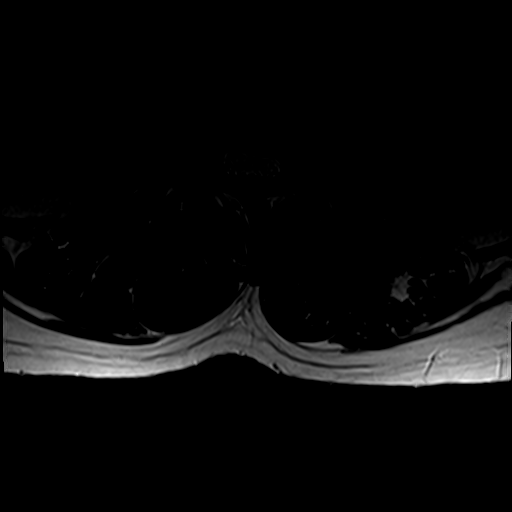
[im 37/44]
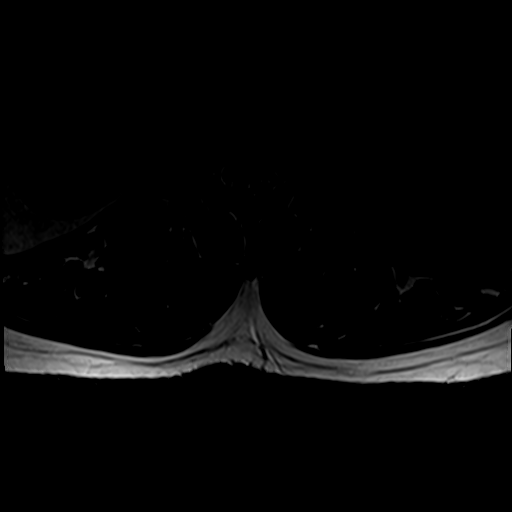
[im 44/44]
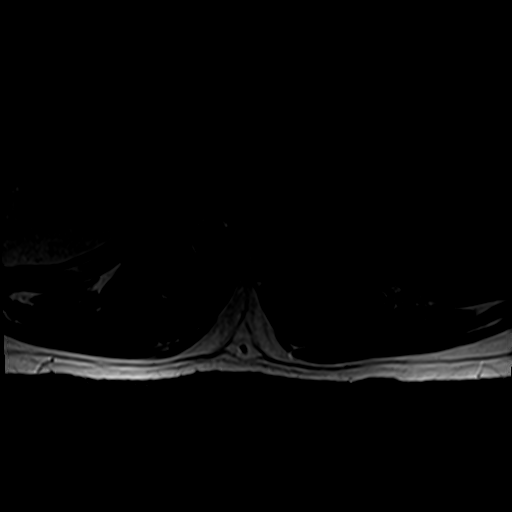

[Series 8: T1 · axial · 4.0mm · 0.31mm/px · z∈[-68,+81]mm · 3 of 44 slices shown (2 of 2)]
[im 7/44]
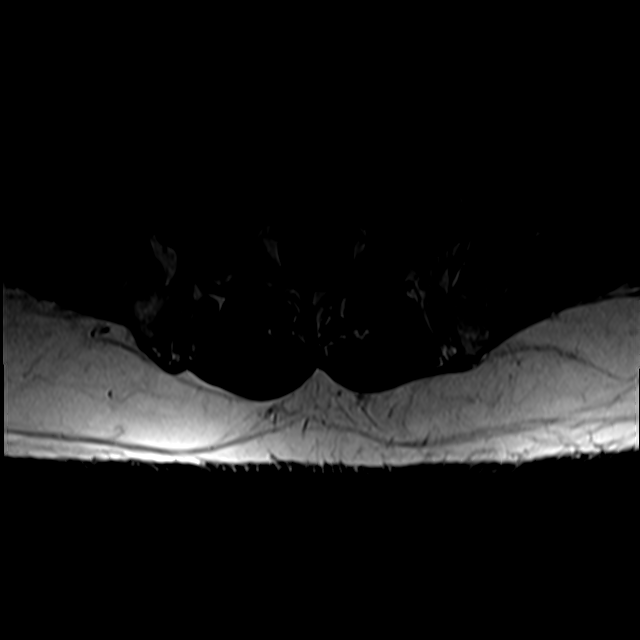
[im 22/44]
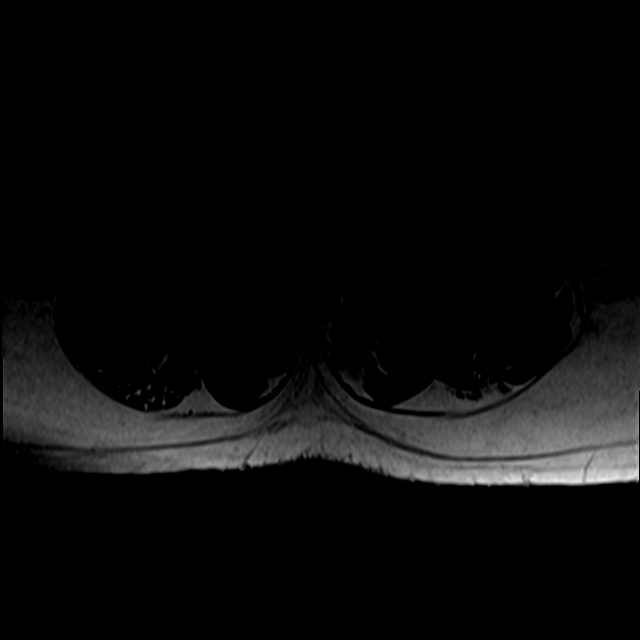
[im 37/44]
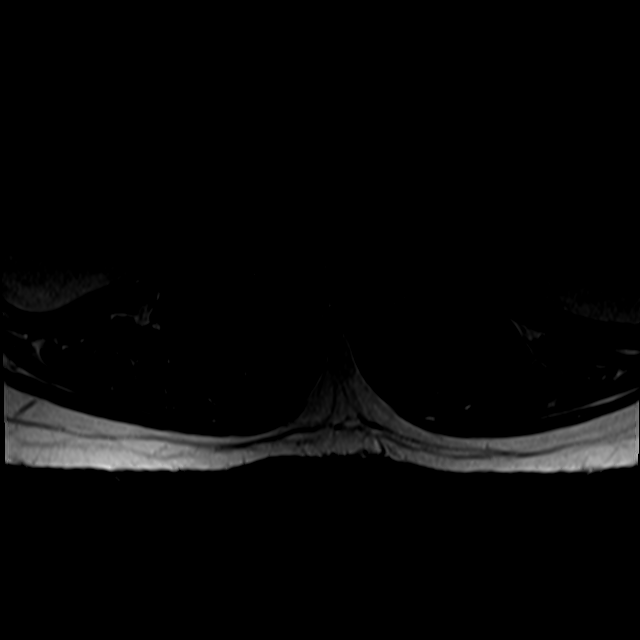

[22 of 48 positions shown; findings below may reference images not displayed]

FINDINGS: Segmentation:  Standard.

Alignment:  Trace anterolisthesis at L5-S1.

Vertebrae: No fracture, evidence of discitis, or focal bone lesion.
Large hemangioma replacing the entire L3 vertebral body.

Conus medullaris and cauda equina: Conus extends to the L1 level.
Conus and cauda equina appear normal.

Paraspinal and other soft tissues: Negative.

Disc levels:

T11-T12: Only seen on the sagittal images.  Negative.

T12-L1:  Small left paracentral disc protrusion.  No stenosis.

L1-L2:  Small left paracentral disc protrusion.  No stenosis.

L2-L3: Small central disc protrusion and annular fissure. No
stenosis.

L3-L4:  Negative.

L4-L5: Trace disc bulge and mild bilateral facet arthropathy. No
stenosis.

L5-S1: Moderate bilateral facet arthropathy. Moderate right and mild
left neuroforaminal stenosis. No spinal canal stenosis.
IMPRESSION: 1. Mild degenerative changes throughout the lumbar spine as
described above. Moderate right and mild left neuroforaminal
stenosis at L5-S1 could affect the bilateral exiting L5 nerve roots.

## 2018-09-18 DIAGNOSIS — F411 Generalized anxiety disorder: Secondary | ICD-10-CM | POA: Insufficient documentation

## 2019-10-09 DIAGNOSIS — M62830 Muscle spasm of back: Secondary | ICD-10-CM | POA: Insufficient documentation

## 2019-11-04 DIAGNOSIS — N529 Male erectile dysfunction, unspecified: Secondary | ICD-10-CM | POA: Insufficient documentation

## 2021-01-18 DIAGNOSIS — M75121 Complete rotator cuff tear or rupture of right shoulder, not specified as traumatic: Secondary | ICD-10-CM | POA: Insufficient documentation

## 2021-01-23 DIAGNOSIS — M12811 Other specific arthropathies, not elsewhere classified, right shoulder: Secondary | ICD-10-CM | POA: Insufficient documentation

## 2021-01-23 DIAGNOSIS — M75101 Unspecified rotator cuff tear or rupture of right shoulder, not specified as traumatic: Secondary | ICD-10-CM | POA: Insufficient documentation

## 2021-05-17 DIAGNOSIS — E6609 Other obesity due to excess calories: Secondary | ICD-10-CM | POA: Insufficient documentation

## 2022-01-12 ENCOUNTER — Telehealth: Payer: Self-pay | Admitting: Family Medicine

## 2022-01-12 ENCOUNTER — Encounter: Payer: Self-pay | Admitting: Family Medicine

## 2022-01-12 ENCOUNTER — Ambulatory Visit (INDEPENDENT_AMBULATORY_CARE_PROVIDER_SITE_OTHER): Payer: 59 | Admitting: Family Medicine

## 2022-01-12 VITALS — BP 158/86 | HR 82 | Temp 98.5°F | Ht 70.0 in | Wt 214.0 lb

## 2022-01-12 DIAGNOSIS — E785 Hyperlipidemia, unspecified: Secondary | ICD-10-CM | POA: Diagnosis not present

## 2022-01-12 DIAGNOSIS — S59909A Unspecified injury of unspecified elbow, initial encounter: Secondary | ICD-10-CM | POA: Insufficient documentation

## 2022-01-12 DIAGNOSIS — E1165 Type 2 diabetes mellitus with hyperglycemia: Secondary | ICD-10-CM | POA: Diagnosis not present

## 2022-01-12 DIAGNOSIS — E1169 Type 2 diabetes mellitus with other specified complication: Secondary | ICD-10-CM | POA: Diagnosis not present

## 2022-01-12 DIAGNOSIS — I10 Essential (primary) hypertension: Secondary | ICD-10-CM | POA: Diagnosis not present

## 2022-01-12 DIAGNOSIS — I159 Secondary hypertension, unspecified: Secondary | ICD-10-CM | POA: Insufficient documentation

## 2022-01-12 LAB — COMPREHENSIVE METABOLIC PANEL
ALT: 13 U/L (ref 0–53)
AST: 16 U/L (ref 0–37)
Albumin: 4.1 g/dL (ref 3.5–5.2)
Alkaline Phosphatase: 73 U/L (ref 39–117)
BUN: 19 mg/dL (ref 6–23)
CO2: 23 mEq/L (ref 19–32)
Calcium: 9 mg/dL (ref 8.4–10.5)
Chloride: 105 mEq/L (ref 96–112)
Creatinine, Ser: 1.28 mg/dL (ref 0.40–1.50)
GFR: 60.73 mL/min (ref >60.00)
Glucose, Bld: 253 mg/dL — ABNORMAL HIGH (ref 70–99)
Potassium: 3.9 mEq/L (ref 3.5–5.1)
Sodium: 136 mEq/L (ref 135–145)
Total Bilirubin: 0.4 mg/dL (ref 0.2–1.2)
Total Protein: 7.2 g/dL (ref 6.0–8.3)

## 2022-01-12 LAB — HEMOGLOBIN A1C: Hgb A1c MFr Bld: 9.1 % — ABNORMAL HIGH (ref 4.6–6.5)

## 2022-01-12 LAB — CBC WITH DIFFERENTIAL/PLATELET
Basophils Absolute: 0 10*3/uL (ref 0.0–0.1)
Basophils Relative: 0.4 % (ref 0.0–3.0)
Eosinophils Absolute: 0.1 10*3/uL (ref 0.0–0.7)
Eosinophils Relative: 4 % (ref 0.0–5.0)
HCT: 36.4 % — ABNORMAL LOW (ref 39.0–52.0)
Hemoglobin: 12 g/dL — ABNORMAL LOW (ref 13.0–17.0)
Lymphocytes Relative: 38.7 % (ref 12.0–46.0)
Lymphs Abs: 1.2 10*3/uL (ref 0.7–4.0)
MCHC: 33 g/dL (ref 30.0–36.0)
MCV: 89.4 fl (ref 78.0–100.0)
Monocytes Absolute: 0.5 10*3/uL (ref 0.1–1.0)
Monocytes Relative: 15.1 % — ABNORMAL HIGH (ref 3.0–12.0)
Neutro Abs: 1.3 10*3/uL — ABNORMAL LOW (ref 1.4–7.7)
Neutrophils Relative %: 41.8 % — ABNORMAL LOW (ref 43.0–77.0)
Platelets: 137 10*3/uL — ABNORMAL LOW (ref 150.0–400.0)
RBC: 4.07 Mil/uL — ABNORMAL LOW (ref 4.22–5.81)
RDW: 12.8 % (ref 11.5–15.5)
WBC: 3.1 10*3/uL — ABNORMAL LOW (ref 4.0–10.5)

## 2022-01-12 LAB — LIPID PANEL
Cholesterol: 96 mg/dL (ref 0–200)
HDL: 24.4 mg/dL — ABNORMAL LOW (ref >39.00)
NonHDL: 71.26
Total CHOL/HDL Ratio: 4
Triglycerides: 245 mg/dL — ABNORMAL HIGH (ref 0.0–149.0)
VLDL: 49 mg/dL — ABNORMAL HIGH (ref 0.0–40.0)

## 2022-01-12 LAB — VITAMIN B12: Vitamin B-12: 243 pg/mL (ref 211–911)

## 2022-01-12 LAB — T4, FREE: Free T4: 0.77 ng/dL (ref 0.60–1.60)

## 2022-01-12 LAB — LDL CHOLESTEROL, DIRECT: Direct LDL: 35 mg/dL

## 2022-01-12 LAB — TSH: TSH: 2.46 u[IU]/mL (ref 0.35–5.50)

## 2022-01-12 NOTE — Telephone Encounter (Signed)
Pt called in to state the following medications are what he is currently taking;  *aspirin 325 MG EC  *carvedilol (COREG) 12.5 MG tablet  *Losartan (COZAAR) 100 MG tablet  *vitamin B-12 (CYANOCOBALAMIN) 100 MCG tablet *TOUJEO MAX SOLOSTAR 300 UNIT/ML Solostar Pen  *atorvastatin (LIPITOR) 10 MG tablet *hydrochlorothiazide (HYDRODIURIL) 25 MG tablet  *meloxicam (MOBIC) 15 MG tablet  *methocarbamol (ROBAXIN) 500 MG tablet *traMADol (ULTRAM) 50 MG tablet  *Semaglutide *cyclobenzaprine (FLEXERIL) 10 MG tablet  **Pt states he needs a rx refill for TOUJEO MAX SOLOSTAR 300 UNIT/ML Solostar Pen and atorvastatin (LIPITOR) 10 MG tablet   ** Pt is wanting to know if needs to be taking Empagliflozin-metFORMIN HCl (SYNJARDY) 12.10-998 MG TABS as he does not have this medication and would need an rx for it.

## 2022-01-12 NOTE — Assessment & Plan Note (Signed)
Discussed that his blood pressure is elevated today.  He will let me know if he is out of any of his blood pressure medications.  He did not bring these in today.

## 2022-01-12 NOTE — Assessment & Plan Note (Addendum)
He has been under the care of Dr. Shawnee Knapp, endocrinologist with [facility] for his diabetes.  States he is out of some of his diabetes medications but is not sure which ones he is currently taking or needs refilled.  Encouraged him to bring all of his medications in so that we can organize his medication list for him. Review of his chart shows that he saw his endocrinologist the end of May 2023 and has a 53-month follow-up scheduled for August 2023.  Recommend that he continue seeing his endocrinologist for diabetes and that I will be happy to be his PCP.  Recommend he call and get refills for diabetes medications from Dr. Shawnee Knapp.

## 2022-01-12 NOTE — Assessment & Plan Note (Signed)
Here today to establish care.  He is unclear as to which medications he is taking and needs refilled.  He is under the care of an endocrinologist, Dr. Shawnee Knapp.

## 2022-01-12 NOTE — Progress Notes (Signed)
New Patient Office Visit  Subjective    Patient ID: [name] [name], male    DOB: 27-Dec-1960  Age: 61 y.o. MRN: [id number]  CC:  Chief Complaint  Patient presents with   New Patient (Initial Visit)    Establish care    HPI TAVI HOOGENDOORN presents to establish care. States he has been out of some of his medications for a month or longer for diabetes and HTN.   He did not being in a medication list or his bottles today and cannot recall which ones he is taking and which refills he needs.   He was seeing a [facility] endocrinologist but states his insurance changed. States he is not currently working due to some type of injury.  Recalls stopping glipizide over a month ago because he thought it was causing him to have diarrhea. He is still having diarrhea.  States he is still taking metformin, may be taking Synjardy but is not sure.   States he is taking Ozempic weekly and has plenty of that.  Out of Toujeo.    Denies fever, chills, dizziness, chest pain, palpitations, shortness of breath, abdominal pain, N/V, urinary symptoms, LE edema.        Outpatient Encounter Medications as of 01/12/2022  Medication Sig   aspirin 325 MG EC tablet Take 1 tablet (325 mg total) by mouth 2 (two) times daily after a meal.   atorvastatin (LIPITOR) 10 MG tablet Take 10 mg by mouth daily.   BD PEN NEEDLE NANO 2ND GEN 32G X 4 MM MISC See admin instructions.   Blood Glucose Monitoring Suppl (ONETOUCH VERIO FLEX SYSTEM) w/Device KIT by Does not apply route.   carvedilol (COREG) 12.5 MG tablet Take 12.5 mg by mouth 2 (two) times daily.   cyclobenzaprine (FLEXERIL) 10 MG tablet Take 1 tablet (10 mg total) by mouth 2 (two) times daily as needed for muscle spasms.   diclofenac (VOLTAREN) 75 MG EC tablet Take 75 mg by mouth 2 (two) times daily.   Empagliflozin-metFORMIN HCl (SYNJARDY) 12.10-998 MG TABS Take 2 tablets by mouth daily.   hydrochlorothiazide (HYDRODIURIL) 25 MG tablet    hydrOXYzine  (VISTARIL) 100 MG capsule Take 100 mg by mouth at bedtime.   losartan (COZAAR) 100 MG tablet Take 100 mg by mouth every morning.    meloxicam (MOBIC) 15 MG tablet TK 1 T PO PRN P   OneTouch Delica Lancets 16X MISC by Does not apply route.   Semaglutide, 2 MG/DOSE, (OZEMPIC, 2 MG/DOSE,) 8 MG/3ML SOPN Inject into the skin.   TOUJEO MAX SOLOSTAR 300 UNIT/ML Solostar Pen SMARTSIG:24 Unit(s) SUB-Q Daily   vitamin B-12 (CYANOCOBALAMIN) 100 MCG tablet Take 100 mcg by mouth daily.   [DISCONTINUED] metFORMIN (GLUCOPHAGE) 1000 MG tablet Take 1,000 mg by mouth 2 (two) times daily with a meal.   amLODipine (NORVASC) 10 MG tablet Take 10 mg by mouth daily. (Patient not taking: Reported on 01/12/2022)   docusate sodium 100 MG CAPS Take 100 mg by mouth 2 (two) times daily. (Patient not taking: Reported on 01/12/2022)   glipiZIDE (GLUCOTROL) 5 MG tablet Take 5 mg by mouth 2 (two) times daily before a meal. (Patient not taking: Reported on 01/12/2022)   HYDROcodone-acetaminophen (NORCO) 7.5-325 MG per tablet Take 1-2 tablets by mouth every 4 (four) hours as needed for moderate pain (maximum of 8 per day.). (Patient not taking: Reported on 01/12/2022)   meclizine (ANTIVERT) 25 MG tablet Take 1 tablet (25 mg total) by mouth 3 (three)  times daily as needed for dizziness. (Patient not taking: Reported on 01/12/2022)   methocarbamol (ROBAXIN) 500 MG tablet Take 1 tablet (500 mg total) by mouth every 6 (six) hours as needed for muscle spasms. (Patient not taking: Reported on 01/12/2022)   traMADol (ULTRAM) 50 MG tablet Take 50 mg by mouth every 6 (six) hours as needed for severe pain. (Patient not taking: Reported on 01/12/2022)   [DISCONTINUED] cephALEXin (KEFLEX) 500 MG capsule Take 1 capsule (500 mg total) by mouth 4 (four) times daily.   [DISCONTINUED] Diclofenac-miSOPROStol 75-0.2 MG TBEC TK 1 T PO BID   No facility-administered encounter medications on file as of 01/12/2022.    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   Procedure: RIGHT TOTAL HIP ARTHROPLASTY ANTERIOR APPROACH;  Surgeon: Mcarthur Rossetti, MD;  Location: [facility];  Service: Orthopedics;  Laterality: Right;   UMBILICAL HERNIA REPAIR  2009    Family History  Problem Relation Age of Onset   Diabetes Mother    Stroke Mother    Diabetes Sister    Diabetes Sister    Diabetes Sister    Leukemia Maternal Aunt     Social History   Socioeconomic History   Marital status: Divorced    Spouse name: Not on file   Number of children: Not on file   Years of education: Not on file   Highest education level: Not on file  Occupational History   Not on file  Tobacco Use   Smoking status: Some Days    Types: Cigars   Smokeless tobacco: Never   Tobacco comments:    a cigar occasionally  Substance and Sexual Activity   Alcohol use: Yes    Comment: beer - occasionally   Drug use: No   Sexual activity: Not on file  Other Topics Concern   Not on file  Social History Narrative   Not on file   Social Determinants of Health   Financial Resource Strain: Not on file  Food Insecurity: Not on file  Transportation Needs: Not on file  Physical Activity: Not on file  Stress: Not on file  Social Connections: Not on file  Intimate Partner Violence: Not on file    ROS      Objective    BP (!) 158/86 (BP Location: Right Arm, Patient Position: Sitting, Cuff Size: Large)   Pulse 82   Temp 98.5 F (36.9 C) (Oral)   Ht 5' 10"  (1.778 m)   Wt 214 lb (97.1 kg)   SpO2 98%   BMI 30.71 kg/m   Physical Exam Constitutional:      General: He is not in acute distress.    Appearance: He is not ill-appearing.  Cardiovascular:     Rate and Rhythm: Normal rate and regular  rhythm.  Pulmonary:     Effort: Pulmonary effort is normal.     Breath sounds: Normal breath sounds.  Skin:    General: Skin is warm and dry.  Neurological:     General: No focal deficit present.     Mental Status: He is alert and oriented to person, place, and time.  Psychiatric:  Mood and Affect: Mood normal.        Behavior: Behavior normal.         Assessment & Plan:   Problem List Items Addressed This Visit       Cardiovascular and Mediastinum   Primary hypertension    Discussed that his blood pressure is elevated today.  He will let me know if he is out of any of his blood pressure medications.  He did not bring these in today.      Relevant Medications   hydrochlorothiazide (HYDRODIURIL) 25 MG tablet   atorvastatin (LIPITOR) 10 MG tablet   Other Relevant Orders   CBC with Differential/Platelet   Comprehensive metabolic panel     Endocrine   Hyperlipidemia associated with type 2 diabetes mellitus ([place])    Here today to establish care.  He is unclear as to which medications he is taking and needs refilled.  He is under the care of an endocrinologist, Dr. Hartford Poli.      Relevant Medications   hydrochlorothiazide (HYDRODIURIL) 25 MG tablet   TOUJEO MAX SOLOSTAR 300 UNIT/ML Solostar Pen   Semaglutide, 2 MG/DOSE, (OZEMPIC, 2 MG/DOSE,) 8 MG/3ML SOPN   Empagliflozin-metFORMIN HCl (SYNJARDY) 12.10-998 MG TABS   atorvastatin (LIPITOR) 10 MG tablet   Other Relevant Orders   Lipid panel   Uncontrolled type 2 diabetes mellitus with hyperglycemia ([place]) - Primary    He has been under the care of Dr. Hartford Poli, endocrinologist with [facility] for his diabetes.  States he is out of some of his diabetes medications but is not sure which ones he is currently taking or needs refilled.  Encouraged him to bring all of his medications in so that we can organize his medication list for him. Review of his chart shows that he saw his endocrinologist the end of May 2023 and has a 37-month follow-up scheduled for August 2023.  Recommend that he continue seeing his endocrinologist for diabetes and that I will be happy to be his PCP.  Recommend he call and get refills for diabetes medications from Dr. LHartford Poli       Relevant Medications   TOUJEO MAX SOLOSTAR 300 UNIT/ML Solostar Pen   Semaglutide, 2 MG/DOSE, (OZEMPIC, 2 MG/DOSE,) 8 MG/3ML SOPN   Empagliflozin-metFORMIN HCl (SYNJARDY) 12.10-998 MG TABS   atorvastatin (LIPITOR) 10 MG tablet   Other Relevant Orders   CBC with Differential/Platelet   Comprehensive metabolic panel   TSH   T4, free   Vitamin B12   Hemoglobin A1c    Return for needs visit next week .   VHarland Dingwall NP-C

## 2022-01-12 NOTE — Patient Instructions (Signed)
It was a pleasure meeting you today.  Thank you for trusting [place] with your health care.  Please go to the lab on the first floor before you leave today.  Bring in all of your medications that you are currently taking even if you take it as needed.  Let us know which ones you need refilled.  I will see you back next week for a follow-up visit

## 2022-01-17 ENCOUNTER — Ambulatory Visit: Payer: 59 | Admitting: Family Medicine

## 2022-01-17 NOTE — Progress Notes (Signed)
He did not show up for his appointment this morning. He was supposed to bring in his current medications so we could reconcile them. I am not clear which medications he is taking, the doses or how often he is taking them. Therefore, I cannot send in refills.  His diabetes in not well controlled, his Hgb A1c is 9.1% and goal is closer to 7%.  Mild anemia and low platelet counts. Please see if he plans to reschedule to discuss appropriate treatment for his conditions. I cannot send in refills until he has an ov with me.

## 2022-01-18 NOTE — Telephone Encounter (Signed)
Called pt and had him reschedule his appt and advised him to bring his current medications to the visit or he will not be able to get any refills. Pt verbalized understanding.

## 2022-01-24 DIAGNOSIS — M19019 Primary osteoarthritis, unspecified shoulder: Secondary | ICD-10-CM | POA: Insufficient documentation

## 2022-02-16 DIAGNOSIS — M25511 Pain in right shoulder: Secondary | ICD-10-CM | POA: Insufficient documentation

## 2022-03-06 ENCOUNTER — Encounter: Payer: Self-pay | Admitting: Family Medicine

## 2022-03-06 ENCOUNTER — Ambulatory Visit (INDEPENDENT_AMBULATORY_CARE_PROVIDER_SITE_OTHER): Payer: Commercial Managed Care - HMO | Admitting: Family Medicine

## 2022-03-06 VITALS — BP 144/90 | HR 67 | Temp 97.4°F | Ht 70.0 in | Wt 209.0 lb

## 2022-03-06 DIAGNOSIS — E1165 Type 2 diabetes mellitus with hyperglycemia: Secondary | ICD-10-CM

## 2022-03-06 DIAGNOSIS — Z23 Encounter for immunization: Secondary | ICD-10-CM

## 2022-03-06 DIAGNOSIS — E1169 Type 2 diabetes mellitus with other specified complication: Secondary | ICD-10-CM | POA: Diagnosis not present

## 2022-03-06 DIAGNOSIS — I10 Essential (primary) hypertension: Secondary | ICD-10-CM | POA: Diagnosis not present

## 2022-03-06 DIAGNOSIS — E785 Hyperlipidemia, unspecified: Secondary | ICD-10-CM

## 2022-03-06 MED ORDER — GLUCOSE BLOOD VI STRP: ORAL_STRIP | 5 refills | Status: DC

## 2022-03-06 MED ORDER — GLIPIZIDE 5 MG PO TABS: 5.0000 mg | ORAL_TABLET | Freq: Every day | ORAL | 0 refills | Status: DC

## 2022-03-06 MED ORDER — BASAGLAR KWIKPEN 100 UNIT/ML ~~LOC~~ SOPN: 10.0000 [IU] | PEN_INJECTOR | Freq: Every day | SUBCUTANEOUS | 2 refills | Status: DC

## 2022-03-06 NOTE — Assessment & Plan Note (Signed)
He was under the care of of endocrinology with [facility] in the past.  Diabetes is not controlled.  Currently he is taking once weekly Ozempic 2 mg.  Toujeo was not affordable.  I will send in Basaglar for him to start taking 10 units daily.  He has taken glipizide in the past and I will send this prescription to his pharmacy as well.  Unclear as to other oral medications and which ones caused side effects in the past.  Counseling on sugar, carbohydrates and alcohol.  Counseling on when to check blood sugars and goal ranges.  Refilled test strips.  Referral to endocrinology

## 2022-03-06 NOTE — Patient Instructions (Addendum)
For your high blood pressure:  Start back on hydrochlorothiazide 25 mg once daily. This is the fluid pill.   Continue taking carvedilol 12.5 mg twice daily.  Continue losartan 100 mg once daily.  For your cholesterol and heart:  Continue atorvastatin 10 mg once daily.  For your diabetes:  I prescribed Basaglar which is a long-acting insulin for you to take 10 units once daily.  I also prescribed glipizide 5 mg to take with breakfast each day.  Continue taking Ozempic 2 mg once weekly.  Check your blood sugars in the morning before you eat which is called fasting.  Goal blood sugar range is 90-130.  You can also check your blood sugars 2 hours after lunch or supper and goal blood sugar range is 130-160.  Try to limit sugar and carbohydrates. Beer and alcohol also raises blood sugar.  Try to get at least 150 minutes of physical activity each week.  Follow-up here in 4 weeks with your blood sugar readings

## 2022-03-06 NOTE — Progress Notes (Signed)
Subjective:     Patient ID: [name] [name], male    DOB: 02-19-1961, 61 y.o.   MRN: [id number]  Chief Complaint  Patient presents with   Annual Exam    HPI Patient is in today for follow up on chronic health conditions. He brought in his home medications.   Diabetes- uncontrolled. Last Hgb A1c 9.1% on 01/12/2022  Taking Ozempic 2 mg injection weekly. He is not taking any other diabetes medications. He is unsure as to which medications caused side effects but is aware that Toujeo was not affordable.  Unclear as to whether Synjardy or Jardiance alone caused diarrhea. He was prescribed glipizide in the past but has not had it recently.   HTN- taking Coreg 12.5 mg bid, losartan 100 mg daily. He stopped HCTZ 25 mg and is unclear why.   HL- taking atorvastatin 10 mg daily.   States he had colonoscopy done in the past year or two. Denies having any polyps. Cannot recall where this was done.   Denies fever, chills, dizziness, chest pain, palpitations, shortness of breath, abdominal pain, N/V/D, urinary symptoms, LE edema.     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   Procedure: RIGHT TOTAL HIP ARTHROPLASTY ANTERIOR APPROACH;  Surgeon: CMcarthur Rossetti MD;  Location: [facility];  Service: Orthopedics;  Laterality: Right;   UMBILICAL HERNIA REPAIR  2009    Family History  Problem Relation Age of Onset   Diabetes Mother    Stroke Mother    Diabetes Sister    Diabetes Sister    Diabetes  Sister    Leukemia Maternal Aunt     Social History   Socioeconomic History   Marital status: Divorced    Spouse name: Not on file   Number of children: Not on file   Years of education: Not on file   Highest education level: Not on file  Occupational History   Not on file  Tobacco Use   Smoking status: Some Days    Types: Cigars   Smokeless tobacco: Never   Tobacco comments:    a cigar occasionally  Substance and Sexual Activity   Alcohol use: Yes    Comment: beer - occasionally   Drug use: No   Sexual activity: Not on file  Other Topics Concern   Not on file  Social History Narrative   Not on file   Social Determinants of Health   Financial Resource Strain: Not on file  Food Insecurity: Not on file  Transportation Needs: Not on file  Physical Activity: Not on file  Stress: Not on file  Social Connections: Not on file  Intimate Partner Violence: Not on file    Outpatient Medications Prior to Visit  Medication Sig Dispense Refill   atorvastatin (LIPITOR) 10 MG tablet Take 10 mg by mouth daily.     BD PEN  NEEDLE NANO 2ND GEN 32G X 4 MM MISC See admin instructions.     Blood Glucose Monitoring Suppl (ONETOUCH VERIO FLEX SYSTEM) w/Device KIT by Does not apply route.     carvedilol (COREG) 12.5 MG tablet Take 12.5 mg by mouth 2 (two) times daily.     hydrochlorothiazide (HYDRODIURIL) 25 MG tablet      losartan (COZAAR) 100 MG tablet Take 100 mg by mouth every morning.      meclizine (ANTIVERT) 25 MG tablet Take 1 tablet (25 mg total) by mouth 3 (three) times daily as needed for dizziness. 10 tablet 0   methocarbamol (ROBAXIN) 500 MG tablet Take 1 tablet (500 mg total) by mouth every 6 (six) hours as needed for muscle spasms. 40 tablet 1   OneTouch Delica Lancets 45X MISC by Does not apply route.     oxyCODONE (OXY IR/ROXICODONE) 5 MG immediate release tablet      Semaglutide, 2 MG/DOSE, (OZEMPIC, 2 MG/DOSE,) 8 MG/3ML SOPN Inject into the skin.     traMADol (ULTRAM)  50 MG tablet Take 50 mg by mouth every 6 (six) hours as needed for severe pain.     amLODipine (NORVASC) 10 MG tablet Take 10 mg by mouth daily. (Patient not taking: Reported on 03/06/2022)     HYDROcodone-acetaminophen (NORCO) 7.5-325 MG per tablet Take 1-2 tablets by mouth every 4 (four) hours as needed for moderate pain (maximum of 8 per day.). (Patient not taking: Reported on 03/06/2022) 60 tablet 0   aspirin 325 MG EC tablet Take 1 tablet (325 mg total) by mouth 2 (two) times daily after a meal. 45 tablet 0   cyclobenzaprine (FLEXERIL) 10 MG tablet Take 1 tablet (10 mg total) by mouth 2 (two) times daily as needed for muscle spasms. 20 tablet 0   diclofenac (VOLTAREN) 75 MG EC tablet Take 75 mg by mouth 2 (two) times daily.     docusate sodium 100 MG CAPS Take 100 mg by mouth 2 (two) times daily. 10 capsule 0   Empagliflozin-metFORMIN HCl (SYNJARDY) 12.10-998 MG TABS Take 2 tablets by mouth daily.     glipiZIDE (GLUCOTROL) 5 MG tablet Take 5 mg by mouth 2 (two) times daily before a meal.     hydrOXYzine (VISTARIL) 100 MG capsule Take 100 mg by mouth at bedtime.     meloxicam (MOBIC) 15 MG tablet TK 1 T PO PRN P     TOUJEO MAX SOLOSTAR 300 UNIT/ML Solostar Pen SMARTSIG:24 Unit(s) SUB-Q Daily (Patient not taking: Reported on 03/06/2022)     vitamin B-12 (CYANOCOBALAMIN) 100 MCG tablet Take 100 mcg by mouth daily. (Patient not taking: Reported on 03/06/2022)     No facility-administered medications prior to visit.    No Known Allergies  ROS     Objective:    Physical Exam Constitutional:      General: He is not in acute distress.    Appearance: He is not ill-appearing.  HENT:     Mouth/Throat:     Mouth: Mucous membranes are moist.  Eyes:     Extraocular Movements: Extraocular movements intact.     Conjunctiva/sclera: Conjunctivae normal.     Pupils: Pupils are equal, round, and reactive to light.  Cardiovascular:     Rate and Rhythm: Normal rate and regular rhythm.  Pulmonary:      Effort: Pulmonary effort is normal.     Breath sounds: Normal breath sounds.  Skin:    General: Skin is warm and dry.  Neurological:  General: No focal deficit present.     Mental Status: He is alert and oriented to person, place, and time.  Psychiatric:        Mood and Affect: Mood normal.        Behavior: Behavior normal.     BP (!) 144/90 (BP Location: Left Arm, Patient Position: Sitting, Cuff Size: Large) Comment: no BP meds  Pulse 67   Temp (!) 97.4 F (36.3 C) (Temporal)   Ht _0  (1.778 m)   Wt 209 lb (94.8 kg)   SpO2 98%   BMI 29.99 kg/m  Wt Readings from Last 3 Encounters:  03/06/22 209 lb (94.8 kg)  01/12/22 214 lb (97.1 kg)  01/05/17 212 lb (96.2 kg)       Assessment & Plan:   Problem List Items Addressed This Visit       Cardiovascular and Mediastinum   Primary hypertension    Not well controlled.  continue carvedilol and losartan.  Start back on HCTZ.  Keep an eye on blood pressure at home and follow-up in 4 weeks.  Recommend low-sodium diet.        Endocrine   Hyperlipidemia associated with type 2 diabetes mellitus ([facility])    Continue atorvastatin 10 mg daily.      Relevant Medications   glipiZIDE (GLUCOTROL) 5 MG tablet   Insulin Glargine (BASAGLAR KWIKPEN) 100 UNIT/ML   Uncontrolled type 2 diabetes mellitus with hyperglycemia ([place]) - Primary    He was under the care of of endocrinology with [facility] in the past.  Diabetes is not controlled.  Currently he is taking once weekly Ozempic 2 mg.  Toujeo was not affordable.  I will send in [place] for him to start taking 10 units daily.  He has taken glipizide in the past and I will send this prescription to his pharmacy as well.  Unclear as to other oral medications and which ones caused side effects in the past.  Counseling on sugar, carbohydrates and alcohol.  Counseling on when to check blood sugars and goal ranges.  Refilled test strips.  Referral to endocrinology      Relevant Medications    glipiZIDE (GLUCOTROL) 5 MG tablet   Insulin Glargine (BASAGLAR KWIKPEN) 100 UNIT/ML   glucose blood test strip   Other Relevant Orders   Ambulatory referral to Endocrinology   Other Visit Diagnoses     Need for influenza vaccination       Relevant Orders   Flu Vaccine QUAD 22moIM (Fluarix, Fluzone & Alfiuria Quad PF) (Completed)       I have discontinued RDeidre AlaB. Bagnell's vitamin B-12, glipiZIDE, aspirin EC, DSS, diclofenac, cyclobenzaprine, Toujeo Max SoloStar, meloxicam, hydrOXYzine, and Synjardy. I am also having him start on glipiZIDE, Basaglar KwikPen, and glucose blood. Additionally, I am having him maintain his losartan, methocarbamol, HYDROcodone-acetaminophen, traMADol, amLODipine, meclizine, carvedilol, OneTouch Verio Flex System, hydrochlorothiazide, BD Pen Needle Nano 2nd Gen, OneTouch Delica Lancets 340J Ozempic (2 MG/DOSE), atorvastatin, and oxyCODONE.  Meds ordered this encounter  Medications   glipiZIDE (GLUCOTROL) 5 MG tablet    Sig: Take 1 tablet (5 mg total) by mouth daily before breakfast.    Dispense:  90 tablet    Refill:  0    Order Specific Question:   Supervising Provider    Answer:   CPricilla HolmA [[id number]]   Insulin Glargine (BASAGLAR KWIKPEN) 100 UNIT/ML    Sig: Inject 10 Units into the skin daily.    Dispense:  3 mL  Refill:  2    Order Specific Question:   Supervising Provider    Answer:   Pricilla Holm A [[id number]]   glucose blood test strip    Sig: Use as instructed to test blood sugars once while fasting and then 2 hours after a meal. (Twice daily)    Dispense:  100 each    Refill:  5

## 2022-03-06 NOTE — Assessment & Plan Note (Signed)
Continue atorvastatin 10 mg daily. 

## 2022-03-06 NOTE — Assessment & Plan Note (Addendum)
Not well controlled.  continue carvedilol and losartan.  Start back on HCTZ.  Keep an eye on blood pressure at home and follow-up in 4 weeks.  Recommend low-sodium diet.

## 2022-03-27 DIAGNOSIS — G5601 Carpal tunnel syndrome, right upper limb: Secondary | ICD-10-CM | POA: Insufficient documentation

## 2022-04-02 DIAGNOSIS — M79642 Pain in left hand: Secondary | ICD-10-CM | POA: Insufficient documentation

## 2022-04-02 DIAGNOSIS — M79641 Pain in right hand: Secondary | ICD-10-CM | POA: Insufficient documentation

## 2022-05-23 ENCOUNTER — Other Ambulatory Visit: Payer: Self-pay | Admitting: Family Medicine

## 2022-05-23 DIAGNOSIS — E1165 Type 2 diabetes mellitus with hyperglycemia: Secondary | ICD-10-CM

## 2022-07-10 ENCOUNTER — Other Ambulatory Visit: Payer: Self-pay | Admitting: Family Medicine

## 2022-07-10 DIAGNOSIS — E1165 Type 2 diabetes mellitus with hyperglycemia: Secondary | ICD-10-CM

## 2022-07-11 ENCOUNTER — Telehealth: Payer: Self-pay

## 2022-07-11 NOTE — Telephone Encounter (Signed)
LM for pt to call our office and schedule visit and to please bring BP readings to visit. Once this visit has been scheduled we can refill medications.

## 2022-07-14 ENCOUNTER — Other Ambulatory Visit: Payer: Self-pay | Admitting: Family Medicine

## 2022-07-14 DIAGNOSIS — E1165 Type 2 diabetes mellitus with hyperglycemia: Secondary | ICD-10-CM

## 2022-07-30 NOTE — Telephone Encounter (Signed)
Received rx refill requests, LM for pt to please call and schedule OV. Once this has been scheduled we can send in refills

## 2022-08-13 ENCOUNTER — Other Ambulatory Visit: Payer: Commercial Managed Care - HMO

## 2022-08-13 NOTE — Progress Notes (Signed)
   08/13/2022  Patient ID: Lajuana Carry, male   DOB: 10/03/1960, 62 y.o.   MRN: [id number] Patient appearing on report for [facility] - Hypertension Control report due to last documented ambulatory blood pressure of 144/90 on 03/06/22. Next appointment with PCP is 08/14/22   Outreached patient to discuss hypertension control and medication management.   HTN Current antihypertensives: Hctz 25mg  daily, carvedilol 12.5mg  BID, losartan 100mg  daily -Patient has an automated upper arm home BP machine but does not check BP -Patient states he does not take amlodipine, but it is still listed as active medication and mentioned in note from last PCP appointment.  Medication Management/Adherence -States medications are accessible and the only one that is expensive for him is the basaglar -Endorses not check blood glucose at home as much as he should due to "being depressed", but he does state he checks once a day  Assessment/Plan: HTN - Currently uncontrolled - Recommend restarting amlodipine 10mg  daily -Suggested checking home BP at least once a week  Medication Management/Adherence -Spoke with patient's pharmacy, and last Basaglar rx was filled and picked up 1/11 for $15 (25 day supply).  Referred to insurance formulary, and there is not a cheaper alternative at this time.  Instructed patient to inform us if cost ever became a barrier to filling prescriptions. -Pharmacy also endorsed last glipizide fill was 12/6 for a 30 day supply, confirming my concern around medication adherence.  Did go through entire medication list with patient, including appropriate administration. -Recommended patient take and record BG at least once daily -Patient did mention depression and concerns around recent loss of his job and financial constraints- could benefit from referral to social work for additional resources/assistance  Follow-Up: I will check in with patient in two weeks to see if we have began  monitoring BP and BG more frequently and further assess adherence.  Darlina Guys, PharmD, DPLA

## 2022-08-14 ENCOUNTER — Ambulatory Visit: Payer: BLUE CROSS/BLUE SHIELD | Admitting: Family Medicine

## 2022-08-21 ENCOUNTER — Other Ambulatory Visit: Payer: Self-pay | Admitting: Family Medicine

## 2022-08-21 ENCOUNTER — Ambulatory Visit: Payer: BLUE CROSS/BLUE SHIELD | Admitting: Family Medicine

## 2022-08-21 DIAGNOSIS — E1165 Type 2 diabetes mellitus with hyperglycemia: Secondary | ICD-10-CM

## 2022-08-28 ENCOUNTER — Telehealth: Payer: Self-pay

## 2022-08-28 NOTE — Progress Notes (Signed)
   08/28/2022  Patient ID: [name] [name], male   DOB: 02/26/1961, 62 y.o.   MRN: [id number]  Subjective/Objective: Follow-up telephone call to check on recent BP and BG readings prior to patient follow up with PCP 2/29.  Medication Management and Adherence -Reports needing refills on all of his medications -He still has been without and not taking amlodipine 80m daily -Endorses checking BP and BG regularly but cannot provide any home readings, but he does state his BP has been high and BG "up and down" -States the pharmacy is having trouble billing his prescriptions to insurance, stating they need additional coverage information  Assessment/Plan -Appointment is scheduled with Endocrinology for 3/20 -Contacted his [place] check on insurance, and premium was not paid.  Will make sure patient is aware this is the issue and see if he needs resource recommendations that may be able to help with this.   -Was able to obtain the following information around refills remaining on medications and last fill dates: Atorvastatin- 90 day supply filled in November, refills remain at pharmacy Amlodipine 153m has never been filled, no refills file Carvedilol 12.14m66mlast filled August 2023 for 90d, no refills remain Hctz 214m66mast filled 90d in September 2023, refills remain Losartan 100mg47md filled in October 2023, refills remain Glipizide 14mg- 67mled in December 2023, refills remain Ozempic- never filled, but there is a prescription on file that can be filled BasaglEurekaed in January 2024 for 30d, refills remain Test Strips- last tilled in September 2023, refills remain -Patient is not taking medications, or checking blood sugar and blood pressure as directed; therefore, it is hard to evaluate efficacy of prescribed regimen. -If unable to pay premium, we will need to look into other ways for patient to get medications.  Will collaborate with PCP since she sees him in person in two  days.  Our [facility] has free supply of basaglar and could fill other generics at low cost ($4-$10).  I recommend at least getting these back on board by filling there until insurance issue is resolved.  CherylDarlina GuysmD, DPLA

## 2022-08-30 ENCOUNTER — Encounter: Payer: Self-pay | Admitting: Family Medicine

## 2022-08-30 ENCOUNTER — Ambulatory Visit: Payer: Commercial Managed Care - HMO | Admitting: Family Medicine

## 2022-08-30 ENCOUNTER — Telehealth: Payer: Self-pay

## 2022-08-30 ENCOUNTER — Other Ambulatory Visit: Payer: Self-pay

## 2022-08-30 VITALS — BP 128/94 | HR 80 | Temp 97.6°F | Ht 70.0 in | Wt 205.0 lb

## 2022-08-30 DIAGNOSIS — M545 Low back pain, unspecified: Secondary | ICD-10-CM

## 2022-08-30 DIAGNOSIS — E785 Hyperlipidemia, unspecified: Secondary | ICD-10-CM

## 2022-08-30 DIAGNOSIS — E559 Vitamin D deficiency, unspecified: Secondary | ICD-10-CM | POA: Diagnosis not present

## 2022-08-30 DIAGNOSIS — E1169 Type 2 diabetes mellitus with other specified complication: Secondary | ICD-10-CM

## 2022-08-30 DIAGNOSIS — F32A Depression, unspecified: Secondary | ICD-10-CM

## 2022-08-30 DIAGNOSIS — I10 Essential (primary) hypertension: Secondary | ICD-10-CM | POA: Diagnosis not present

## 2022-08-30 DIAGNOSIS — E1165 Type 2 diabetes mellitus with hyperglycemia: Secondary | ICD-10-CM | POA: Diagnosis not present

## 2022-08-30 DIAGNOSIS — G479 Sleep disorder, unspecified: Secondary | ICD-10-CM | POA: Diagnosis not present

## 2022-08-30 DIAGNOSIS — F411 Generalized anxiety disorder: Secondary | ICD-10-CM

## 2022-08-30 DIAGNOSIS — G8929 Other chronic pain: Secondary | ICD-10-CM

## 2022-08-30 DIAGNOSIS — R5383 Other fatigue: Secondary | ICD-10-CM

## 2022-08-30 DIAGNOSIS — Z1159 Encounter for screening for other viral diseases: Secondary | ICD-10-CM

## 2022-08-30 DIAGNOSIS — Z91148 Patient's other noncompliance with medication regimen for other reason: Secondary | ICD-10-CM | POA: Insufficient documentation

## 2022-08-30 LAB — CBC WITH DIFFERENTIAL/PLATELET
Basophils Absolute: 0 10*3/uL (ref 0.0–0.1)
Basophils Relative: 0.5 % (ref 0.0–3.0)
Eosinophils Absolute: 0.2 10*3/uL (ref 0.0–0.7)
Eosinophils Relative: 5.2 % — ABNORMAL HIGH (ref 0.0–5.0)
HCT: 36.1 % — ABNORMAL LOW (ref 39.0–52.0)
Hemoglobin: 12.1 g/dL — ABNORMAL LOW (ref 13.0–17.0)
Lymphocytes Relative: 37.7 % (ref 12.0–46.0)
Lymphs Abs: 1.4 10*3/uL (ref 0.7–4.0)
MCHC: 33.6 g/dL (ref 30.0–36.0)
MCV: 89.1 fl (ref 78.0–100.0)
Monocytes Absolute: 0.7 10*3/uL (ref 0.1–1.0)
Monocytes Relative: 20.2 % — ABNORMAL HIGH (ref 3.0–12.0)
Neutro Abs: 1.3 10*3/uL — ABNORMAL LOW (ref 1.4–7.7)
Neutrophils Relative %: 36.4 % — ABNORMAL LOW (ref 43.0–77.0)
Platelets: 178 10*3/uL (ref 150.0–400.0)
RBC: 4.05 Mil/uL — ABNORMAL LOW (ref 4.22–5.81)
RDW: 13.4 % (ref 11.5–15.5)
WBC: 3.6 10*3/uL — ABNORMAL LOW (ref 4.0–10.5)

## 2022-08-30 LAB — TSH: TSH: 2.12 u[IU]/mL (ref 0.35–5.50)

## 2022-08-30 LAB — HEMOGLOBIN A1C: Hgb A1c MFr Bld: 9.7 % — ABNORMAL HIGH (ref 4.6–6.5)

## 2022-08-30 LAB — COMPREHENSIVE METABOLIC PANEL
ALT: 24 U/L (ref 0–53)
AST: 24 U/L (ref 0–37)
Albumin: 4.2 g/dL (ref 3.5–5.2)
Alkaline Phosphatase: 62 U/L (ref 39–117)
BUN: 29 mg/dL — ABNORMAL HIGH (ref 6–23)
CO2: 28 mEq/L (ref 19–32)
Calcium: 9.8 mg/dL (ref 8.4–10.5)
Chloride: 102 mEq/L (ref 96–112)
Creatinine, Ser: 1.42 mg/dL (ref 0.40–1.50)
GFR: 53.38 mL/min — ABNORMAL LOW (ref >60.00)
Glucose, Bld: 134 mg/dL — ABNORMAL HIGH (ref 70–99)
Potassium: 3.8 mEq/L (ref 3.5–5.1)
Sodium: 140 mEq/L (ref 135–145)
Total Bilirubin: 0.5 mg/dL (ref 0.2–1.2)
Total Protein: 7.6 g/dL (ref 6.0–8.3)

## 2022-08-30 LAB — LIPID PANEL
Cholesterol: 110 mg/dL (ref 0–200)
HDL: 28 mg/dL — ABNORMAL LOW (ref >39.00)
LDL Cholesterol: 47 mg/dL (ref 0–99)
NonHDL: 82.46
Total CHOL/HDL Ratio: 4
Triglycerides: 177 mg/dL — ABNORMAL HIGH (ref 0.0–149.0)
VLDL: 35.4 mg/dL (ref 0.0–40.0)

## 2022-08-30 LAB — T4, FREE: Free T4: 0.91 ng/dL (ref 0.60–1.60)

## 2022-08-30 LAB — MICROALBUMIN / CREATININE URINE RATIO
Creatinine,U: 119.9 mg/dL
Microalb Creat Ratio: 3.8 mg/g (ref 0.0–30.0)
Microalb, Ur: 4.6 mg/dL — ABNORMAL HIGH (ref 0.0–1.9)

## 2022-08-30 LAB — VITAMIN B12: Vitamin B-12: 237 pg/mL (ref 211–911)

## 2022-08-30 LAB — VITAMIN D 25 HYDROXY (VIT D DEFICIENCY, FRACTURES): VITD: 15.38 ng/mL — ABNORMAL LOW (ref 30.00–100.00)

## 2022-08-30 MED ORDER — GLIPIZIDE 5 MG PO TABS
5.0000 mg | ORAL_TABLET | Freq: Every day | ORAL | 0 refills | Status: DC
  Filled 2022-08-30: qty 30, 30d supply, fill #0

## 2022-08-30 MED ORDER — ATORVASTATIN CALCIUM 10 MG PO TABS
10.0000 mg | ORAL_TABLET | Freq: Every day | ORAL | 0 refills | Status: DC
  Filled 2022-08-30: qty 30, 30d supply, fill #0
  Filled 2022-11-05: qty 60, 60d supply, fill #1
  Filled 2022-11-20: qty 30, 30d supply, fill #1
  Filled 2023-01-02: qty 30, 30d supply, fill #2

## 2022-08-30 MED ORDER — LOSARTAN POTASSIUM 100 MG PO TABS
100.0000 mg | ORAL_TABLET | Freq: Every morning | ORAL | 0 refills | Status: DC
  Filled 2022-08-30: qty 30, 30d supply, fill #0
  Filled 2022-11-05: qty 60, 60d supply, fill #1
  Filled 2022-11-20: qty 30, 30d supply, fill #1
  Filled 2023-01-02: qty 30, 30d supply, fill #2

## 2022-08-30 MED ORDER — BASAGLAR KWIKPEN 100 UNIT/ML ~~LOC~~ SOPN
10.0000 [IU] | PEN_INJECTOR | Freq: Every day | SUBCUTANEOUS | 1 refills | Status: DC
  Filled 2022-08-30: qty 3, 30d supply, fill #0
  Filled 2022-08-31: qty 3, 23d supply, fill #0

## 2022-08-30 MED ORDER — AMLODIPINE BESYLATE 10 MG PO TABS
10.0000 mg | ORAL_TABLET | Freq: Every day | ORAL | 0 refills | Status: DC
  Filled 2022-08-30: qty 30, 30d supply, fill #0
  Filled 2022-11-05: qty 60, 60d supply, fill #1
  Filled 2022-11-20: qty 30, 30d supply, fill #1
  Filled 2023-01-02: qty 30, 30d supply, fill #2

## 2022-08-30 MED ORDER — HYDROCHLOROTHIAZIDE 25 MG PO TABS
25.0000 mg | ORAL_TABLET | Freq: Every day | ORAL | 0 refills | Status: DC
  Filled 2022-08-30: qty 30, 30d supply, fill #0
  Filled 2022-11-05: qty 60, 60d supply, fill #1
  Filled 2022-11-20 – 2023-01-02 (×4): qty 30, 30d supply, fill #1

## 2022-08-30 MED ORDER — CARVEDILOL 12.5 MG PO TABS
12.5000 mg | ORAL_TABLET | Freq: Two times a day (BID) | ORAL | 0 refills | Status: DC
  Filled 2022-08-30: qty 60, 30d supply, fill #0
  Filled 2022-11-05: qty 30, 15d supply, fill #1

## 2022-08-30 NOTE — Telephone Encounter (Signed)
Good Afternoon,   Vickie saw the patient in office today and believes it would e beneficial for you to meet with the patient and was wondering if you have any time soon you could set this up with him? She is planning for patient to return in about 2 weeks to the office.

## 2022-08-30 NOTE — Telephone Encounter (Signed)
-----   Message from Darlina Guys, [facility] sent at 08/30/2022  2:16 PM EST ----- Good afternoon,  I am more than happy to see if I can schedule a time to meet with Mr. Northam in person and believe it would be beneficial.  Is there space at your practice I could do so; and if so, what days/times would that be available?  Thank you!  Darlina Guys, PharmD, DPLA

## 2022-08-30 NOTE — Progress Notes (Signed)
Subjective:     Patient ID: [name] [name], male    DOB: [birth year], 62 y.o.   MRN: [id number]  Chief Complaint  Patient presents with   Hypertension    BP f/u, hasn't been checking at home. Says he did it last month and it was doing "alright"    HPI Patient is in today for medication management visit.   States he has been out of his medications. Recent consult with pharmacist.   Having issues with pain. Seeing orthopedist.   Lives with his wife who is not well. He is taking care of her. 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 States funds are not as good as they have been in the past.  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   Working nights. Having trouble sleeping during the day.   No issues buying food.   Hx of colonoscopy with [facility].   DM-  Checking BS sporadically.   States his mood is good but he has been tired and stressed.  Denies anger issues or suicidal ideations. He does have a hx of this.  Hx of hypnosis which worked per patient.   No regular alcohol or drug use.        08/30/2022    9:44 AM 03/06/2022    8:40 AM 01/12/2022    8:22 AM  Depression screen PHQ 2/9  Decreased Interest 3 0 0  Down, Depressed, Hopeless 3 0 0  PHQ - 2 Score 6 0 0  Altered sleeping 3  1  Tired, decreased energy 1  1  Change in appetite 0  1  Feeling bad or failure about yourself  0  0  Trouble concentrating 1  1  Moving slowly or fidgety/restless 0  0  Suicidal thoughts 0  0  PHQ-9 Score 11  4       Health Maintenance Due  Topic Date Due   FOOT EXAM  Never done   OPHTHALMOLOGY EXAM  Never done   COLONOSCOPY (Pts 45-58yr Insurance coverage will need to be confirmed)  Never done    Past Medical History:  Diagnosis Date   Arthritis    Depression    Diabetes mellitus    Hypertension     Past Surgical History:  Procedure Laterality Date   ANKLE SURGERY  1996   L ankle   CLAVICLE SURGERY  2010   JOINT REPLACEMENT      metal plate in lft knee   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   Procedure: RIGHT TOTAL HIP ARTHROPLASTY ANTERIOR APPROACH;  Surgeon: CMcarthur Rossetti MD;  Location: [facility];  Service: Orthopedics;  Laterality: Right;   UMBILICAL HERNIA REPAIR  2009    Family History  Problem Relation Age of Onset   Diabetes Mother    Stroke Mother    Diabetes Sister    Diabetes Sister    Diabetes Sister    Leukemia Maternal Aunt     Social History   Socioeconomic History   Marital status: Divorced    Spouse name: Not on file   Number of children: Not on file   Years of education: Not on file   Highest education level: Not on file  Occupational History   Not on file  Tobacco Use   Smoking status: Some Days    Types: Cigars   Smokeless tobacco: Never   Tobacco comments:    a cigar occasionally  Substance and Sexual  Activity   Alcohol use: Yes    Comment: beer - occasionally   Drug use: No   Sexual activity: Not on file  Other Topics Concern   Not on file  Social History Narrative   Not on file   Social Determinants of Health   Financial Resource Strain: Not on file  Food Insecurity: Not on file  Transportation Needs: Not on file  Physical Activity: Not on file  Stress: Not on file  Social Connections: Not on file  Intimate Partner Violence: Not on file    Outpatient Medications Prior to Visit  Medication Sig Dispense Refill   Ascorbic Acid (VITAMIN C) 1000 MG tablet Take 1,000 mg by mouth daily.     BD PEN NEEDLE NANO 2ND GEN 32G X 4 MM MISC See admin instructions.     Blood Glucose Monitoring Suppl (ONETOUCH VERIO FLEX SYSTEM) w/Device KIT by Does not apply route.     diclofenac (VOLTAREN) 75 MG EC tablet Take 75 mg by mouth 2 (two) times daily.     ergocalciferol (VITAMIN D2) 1.25 MG (50000 UT) capsule Take 50,000 Units by mouth once a week.     glucose blood test strip Use as instructed to test blood sugars once while fasting and then 2 hours after a meal.  (Twice daily) 100 each 5   methocarbamol (ROBAXIN) 500 MG tablet Take 1 tablet (500 mg total) by mouth every 6 (six) hours as needed for muscle spasms. 40 tablet 1   OneTouch Delica Lancets 99991111 MISC by Does not apply route.     pyridoxine (B-6) 100 MG tablet Take 100 mg by mouth daily.     Semaglutide, 2 MG/DOSE, (OZEMPIC, 2 MG/DOSE,) 8 MG/3ML SOPN Inject into the skin.     traMADol (ULTRAM) 50 MG tablet Take 50 mg by mouth every 6 (six) hours as needed for severe pain.     amLODipine (NORVASC) 10 MG tablet Take 10 mg by mouth daily.     atorvastatin (LIPITOR) 10 MG tablet Take 10 mg by mouth daily.     carvedilol (COREG) 12.5 MG tablet Take 12.5 mg by mouth 2 (two) times daily.     glipiZIDE (GLUCOTROL) 5 MG tablet TAKE 1 TABLET(5 MG) BY MOUTH DAILY BEFORE BREAKFAST. 30 tablet 0   hydrochlorothiazide (HYDRODIURIL) 25 MG tablet      Insulin Glargine (BASAGLAR KWIKPEN) 100 UNIT/ML ADMINISTER 10 UNITS UNDER THE SKIN DAILY 3 mL 2   losartan (COZAAR) 100 MG tablet Take 100 mg by mouth every morning.      HYDROcodone-acetaminophen (NORCO) 7.5-325 MG per tablet Take 1-2 tablets by mouth every 4 (four) hours as needed for moderate pain (maximum of 8 per day.). (Patient not taking: Reported on 03/06/2022) 60 tablet 0   oxyCODONE (OXY IR/ROXICODONE) 5 MG immediate release tablet  (Patient not taking: Reported on 08/13/2022)     No facility-administered medications prior to visit.    No Known Allergies  ROS     Objective:    Physical Exam Constitutional:      General: He is not in acute distress.    Appearance: He is not ill-appearing.  Eyes:     Extraocular Movements: Extraocular movements intact.     Conjunctiva/sclera: Conjunctivae normal.  Cardiovascular:     Rate and Rhythm: Normal rate and regular rhythm.  Pulmonary:     Effort: Pulmonary effort is normal.     Breath sounds: Normal breath sounds.  Musculoskeletal:     Cervical back: Normal range  of motion and neck supple.     Right  lower leg: No edema.     Left lower leg: No edema.  Skin:    General: Skin is warm and dry.  Neurological:     General: No focal deficit present.     Mental Status: He is alert and oriented to person, place, and time.  Psychiatric:        Mood and Affect: Mood normal.        Behavior: Behavior normal.        Thought Content: Thought content normal.     BP (!) 128/94 (BP Location: Left Arm, Patient Position: Sitting, Cuff Size: Large)   Pulse 80   Temp 97.6 F (36.4 C) (Temporal)   Ht '5\' 10"'$  (1.778 m)   Wt 205 lb (93 kg)   SpO2 99%   BMI 29.41 kg/m  Wt Readings from Last 3 Encounters:  08/30/22 205 lb (93 kg)  03/06/22 209 lb (94.8 kg)  01/12/22 214 lb (97.1 kg)       Assessment & Plan:   Problem List Items Addressed This Visit       Cardiovascular and Mediastinum   Primary hypertension - Primary    Discussed the importance of good medication compliance.  Amlodipine 10 mg, HCTZ 25 mg, losartan 100 mg, carvedilol 12.5 mg recommended.  Encouraged him to monitor blood pressure at home. Encourage low-sodium diet.  He does have some financial concerns.  Medications all sent to [facility]. He will follow-up here in 2 weeks and will also meet with pharmacist Paulina Fusi.      Relevant Medications   amLODipine (NORVASC) 10 MG tablet   atorvastatin (LIPITOR) 10 MG tablet   hydrochlorothiazide (HYDRODIURIL) 25 MG tablet   losartan (COZAAR) 100 MG tablet   carvedilol (COREG) 12.5 MG tablet   Other Relevant Orders   CBC with Differential/Platelet (Completed)   Comprehensive metabolic panel (Completed)     Endocrine   Hyperlipidemia associated with type 2 diabetes mellitus ([place])    Check lipids today.  Continue statin therapy.  Counseling on low-fat, low-cholesterol diet.      Relevant Medications   amLODipine (NORVASC) 10 MG tablet   atorvastatin (LIPITOR) 10 MG tablet   glipiZIDE (GLUCOTROL) 5 MG tablet   hydrochlorothiazide (HYDRODIURIL) 25 MG  tablet   Insulin Glargine (BASAGLAR KWIKPEN) 100 UNIT/ML   losartan (COZAAR) 100 MG tablet   carvedilol (COREG) 12.5 MG tablet   Other Relevant Orders   Lipid panel (Completed)   Uncontrolled type 2 diabetes mellitus with hyperglycemia ([place])    Diabetes has been uncontrolled.  Check labs including A1c today.  Glipizide 5 mg and Basaglar 10 units prescribed.  Ozempic prescription in EMR.  Follow-up here in 2 weeks and also meet with pharmacist.      Relevant Medications   atorvastatin (LIPITOR) 10 MG tablet   glipiZIDE (GLUCOTROL) 5 MG tablet   Insulin Glargine (BASAGLAR KWIKPEN) 100 UNIT/ML   losartan (COZAAR) 100 MG tablet   Other Relevant Orders   CBC with Differential/Platelet (Completed)   Comprehensive metabolic panel (Completed)   Hemoglobin A1c (Completed)   Microalbumin / creatinine urine ratio (Completed)     Other   Chronic bilateral low back pain   Depression    Denies SI.  States his mood is stable.  Reports history of hypnosis which helped depression and anxiety.  Denies self-medicating for depression.      GAD (generalized anxiety disorder)    Reports  anxiety is manageable.      Noncompliance with medications   Other fatigue    Discussed multiple etiologies for fatigue.  This may be multifactorial.  He has been off of medications for diabetes, hypertension and other chronic health conditions.  We are working on assisting him with obtaining these medications and better compliance.      Relevant Orders   CBC with Differential/Platelet (Completed)   Comprehensive metabolic panel (Completed)   TSH (Completed)   T4, free (Completed)   Vitamin B12 (Completed)   Sleep disturbances    Recent change to working nights.  Counseling on good sleep hygiene.  Discussed getting blackout curtains or wearing a sleep mask, earplugs and using a fan or white noise.      Vitamin D deficiency    Check vitamin D level and follow-up      Relevant Orders   VITAMIN D 25 Hydroxy  (Vit-D Deficiency, Fractures) (Completed)   Other Visit Diagnoses     Need for hepatitis C screening test       Relevant Orders   Hepatitis C antibody (Completed)       I have discontinued [name] [name]'s HYDROcodone-acetaminophen and oxyCODONE. I have also changed his amLODipine, atorvastatin, glipiZIDE, hydrochlorothiazide, Basaglar KwikPen, losartan, and carvedilol. Additionally, I am having him maintain his methocarbamol, traMADol, OneTouch Verio Flex System, BD Pen Needle Nano 2nd Gen, OneTouch Delica Lancets 99991111, Ozempic (2 MG/DOSE), glucose blood, diclofenac, vitamin C, pyridoxine, and ergocalciferol.  Meds ordered this encounter  Medications   amLODipine (NORVASC) 10 MG tablet    Sig: Take 1 tablet (10 mg total) by mouth daily.    Dispense:  90 tablet    Refill:  0   atorvastatin (LIPITOR) 10 MG tablet    Sig: Take 1 tablet (10 mg total) by mouth daily.    Dispense:  90 tablet    Refill:  0   glipiZIDE (GLUCOTROL) 5 MG tablet    Sig: Take 1 tablet (5 mg total) by mouth daily before breakfast.    Dispense:  90 tablet    Refill:  0   hydrochlorothiazide (HYDRODIURIL) 25 MG tablet    Sig: Take 1 tablet (25 mg total) by mouth daily.    Dispense:  90 tablet    Refill:  0   Insulin Glargine (BASAGLAR KWIKPEN) 100 UNIT/ML    Sig: Inject 10 Units into the skin daily.    Dispense:  15 mL    Refill:  1   losartan (COZAAR) 100 MG tablet    Sig: Take 1 tablet (100 mg total) by mouth every morning.    Dispense:  90 tablet    Refill:  0   carvedilol (COREG) 12.5 MG tablet    Sig: Take 1 tablet (12.5 mg total) by mouth 2 (two) times daily.    Dispense:  90 tablet    Refill:  0    Order Specific Question:   Supervising Provider    Answer:   Pricilla Holm A [id number]

## 2022-08-30 NOTE — Telephone Encounter (Signed)
We just looked and we tried to get him to schedule an appointment for 2 weeks but it looks like he did not schedule so we are unsure if he is coming back. Vickie was hoping you could meet with him around the same time as her visit? We have space that you could use any day of the week, except it may be more tight on Thursdays. We can wait to see if he schedules or if you speak to him soon maybe you can encourage him to follow up?

## 2022-08-31 ENCOUNTER — Telehealth: Payer: Self-pay

## 2022-08-31 ENCOUNTER — Other Ambulatory Visit: Payer: Self-pay

## 2022-08-31 LAB — HEPATITIS C ANTIBODY: Hepatitis C Ab: NONREACTIVE

## 2022-08-31 NOTE — Progress Notes (Signed)
   08/31/2022  Patient ID: [name] [name], male   DOB: 16-Sep-1960, 62 y.o.   MRN: [id number]  Harland Dingwall, PCP, sent new prescriptions to [place] at [facility] to be filled at their generic prices during lapse in patient's insurance coverage (premium not recently paid).  Contact was made with pharmacy to verify prescriptions were ready and check amount owed.  Pharmacy is able to provide 1 month of Basaglar to patient at no cost, and the total for all 7 medications sent in yesterday is $34.   Contact was made with patient to inform him and provide address, hours and phone number to pharmacy.    Followp-up appointments were made for Tuesday, March 12th to see Vickie and myself in person for medication management.  Darlina Guys, PharmD, DPLA

## 2022-09-03 ENCOUNTER — Other Ambulatory Visit: Payer: Self-pay | Admitting: Family Medicine

## 2022-09-03 ENCOUNTER — Other Ambulatory Visit: Payer: Self-pay

## 2022-09-03 DIAGNOSIS — F32A Depression, unspecified: Secondary | ICD-10-CM | POA: Insufficient documentation

## 2022-09-03 DIAGNOSIS — R5383 Other fatigue: Secondary | ICD-10-CM | POA: Insufficient documentation

## 2022-09-03 DIAGNOSIS — E559 Vitamin D deficiency, unspecified: Secondary | ICD-10-CM | POA: Insufficient documentation

## 2022-09-03 MED ORDER — ERGOCALCIFEROL 1.25 MG (50000 UT) PO CAPS
50000.0000 [IU] | ORAL_CAPSULE | ORAL | 0 refills | Status: DC
  Filled 2022-09-03: qty 8, 56d supply, fill #0

## 2022-09-03 NOTE — Assessment & Plan Note (Signed)
Discussed the importance of good medication compliance.  Amlodipine 10 mg, HCTZ 25 mg, losartan 100 mg, carvedilol 12.5 mg recommended.  Encouraged him to monitor blood pressure at home. Encourage low-sodium diet.  He does have some financial concerns.  Medications all sent to [facility]. He will follow-up here in 2 weeks and will also meet with pharmacist Paulina Fusi.

## 2022-09-03 NOTE — Progress Notes (Signed)
Please let him know that his vitamin D is quite low.  I sent in a vitamin D prescription for him to take once weekly.  Also, his blood sugars are uncontrolled which we expected.  Hopefully he has been able to pick up his medications and start taking them or will be able to do that today. Please schedule him for his 2-week follow-up as recommended with me and the pharmacist.  Thank you.

## 2022-09-03 NOTE — Assessment & Plan Note (Signed)
Discussed multiple etiologies for fatigue.  This may be multifactorial.  He has been off of medications for diabetes, hypertension and other chronic health conditions.  We are working on assisting him with obtaining these medications and better compliance.

## 2022-09-03 NOTE — Assessment & Plan Note (Signed)
Reports anxiety is manageable.

## 2022-09-03 NOTE — Assessment & Plan Note (Signed)
Recent change to working nights.  Counseling on good sleep hygiene.  Discussed getting blackout curtains or wearing a sleep mask, earplugs and using a fan or white noise.

## 2022-09-03 NOTE — Assessment & Plan Note (Signed)
Diabetes has been uncontrolled.  Check labs including A1c today.  Glipizide 5 mg and Basaglar 10 units prescribed.  Ozempic prescription in EMR.  Follow-up here in 2 weeks and also meet with pharmacist.

## 2022-09-03 NOTE — Telephone Encounter (Signed)
Thank you so much for everything. I was able to check and everything looks good on our end schedule wise. I will have a room ready for you on that Tuesday, just let our front staff know to notify Jarrett Soho and I can get you a room. Thanks again!

## 2022-09-03 NOTE — Assessment & Plan Note (Signed)
Check vitamin D level and follow-up

## 2022-09-03 NOTE — Assessment & Plan Note (Signed)
Denies SI.  States his mood is stable.  Reports history of hypnosis which helped depression and anxiety.  Denies self-medicating for depression.

## 2022-09-03 NOTE — Assessment & Plan Note (Signed)
Check lipids today.  Continue statin therapy.  Counseling on low-fat, low-cholesterol diet.

## 2022-09-05 ENCOUNTER — Other Ambulatory Visit: Payer: Self-pay

## 2022-09-06 ENCOUNTER — Other Ambulatory Visit: Payer: Self-pay

## 2022-09-10 ENCOUNTER — Telehealth: Payer: Self-pay

## 2022-09-10 NOTE — Progress Notes (Signed)
   09/10/2022  Patient ID: [name] [name], male   DOB: December 10, 1960, 63 y.o.   MRN: [id number]  Patient outreach to remind of appointment with myself and Harland Dingwall tomorrow morning at [facility].  Also reiterated to bring medication bottles for all prescriptions he is currently taking and also to take doses due prior to appointment.  Darlina Guys, PharmD, DPLA

## 2022-09-11 ENCOUNTER — Other Ambulatory Visit: Payer: Commercial Managed Care - HMO

## 2022-09-11 ENCOUNTER — Ambulatory Visit (INDEPENDENT_AMBULATORY_CARE_PROVIDER_SITE_OTHER): Payer: Commercial Managed Care - HMO | Admitting: Family Medicine

## 2022-09-11 ENCOUNTER — Encounter: Payer: Self-pay | Admitting: Family Medicine

## 2022-09-11 ENCOUNTER — Other Ambulatory Visit: Payer: Self-pay

## 2022-09-11 VITALS — BP 136/86 | HR 82 | Temp 97.6°F | Ht 70.0 in

## 2022-09-11 DIAGNOSIS — E1169 Type 2 diabetes mellitus with other specified complication: Secondary | ICD-10-CM

## 2022-09-11 DIAGNOSIS — I10 Essential (primary) hypertension: Secondary | ICD-10-CM | POA: Diagnosis not present

## 2022-09-11 DIAGNOSIS — Z91148 Patient's other noncompliance with medication regimen for other reason: Secondary | ICD-10-CM

## 2022-09-11 DIAGNOSIS — E1165 Type 2 diabetes mellitus with hyperglycemia: Secondary | ICD-10-CM | POA: Diagnosis not present

## 2022-09-11 DIAGNOSIS — D709 Neutropenia, unspecified: Secondary | ICD-10-CM

## 2022-09-11 DIAGNOSIS — D649 Anemia, unspecified: Secondary | ICD-10-CM | POA: Diagnosis not present

## 2022-09-11 DIAGNOSIS — E559 Vitamin D deficiency, unspecified: Secondary | ICD-10-CM

## 2022-09-11 DIAGNOSIS — E785 Hyperlipidemia, unspecified: Secondary | ICD-10-CM

## 2022-09-11 LAB — CBC WITH DIFFERENTIAL/PLATELET
Basophils Absolute: 0 10*3/uL (ref 0.0–0.1)
Basophils Relative: 0.7 % (ref 0.0–3.0)
Eosinophils Absolute: 0.2 10*3/uL (ref 0.0–0.7)
Eosinophils Relative: 3.9 % (ref 0.0–5.0)
HCT: 36.8 % — ABNORMAL LOW (ref 39.0–52.0)
Hemoglobin: 12.3 g/dL — ABNORMAL LOW (ref 13.0–17.0)
Lymphocytes Relative: 34.4 % (ref 12.0–46.0)
Lymphs Abs: 1.4 10*3/uL (ref 0.7–4.0)
MCHC: 33.3 g/dL (ref 30.0–36.0)
MCV: 88.3 fl (ref 78.0–100.0)
Monocytes Absolute: 0.6 10*3/uL (ref 0.1–1.0)
Monocytes Relative: 14.9 % — ABNORMAL HIGH (ref 3.0–12.0)
Neutro Abs: 1.9 10*3/uL (ref 1.4–7.7)
Neutrophils Relative %: 46.1 % (ref 43.0–77.0)
Platelets: 193 10*3/uL (ref 150.0–400.0)
RBC: 4.17 Mil/uL — ABNORMAL LOW (ref 4.22–5.81)
RDW: 12.8 % (ref 11.5–15.5)
WBC: 4.2 10*3/uL (ref 4.0–10.5)

## 2022-09-11 LAB — FOLATE: Folate: 9.4 ng/mL (ref >5.9)

## 2022-09-11 MED ORDER — TRULICITY 0.75 MG/0.5ML ~~LOC~~ SOAJ
0.7500 mg | SUBCUTANEOUS | 2 refills | Status: DC
  Filled 2022-09-11: qty 2, 28d supply, fill #0

## 2022-09-11 NOTE — Patient Instructions (Addendum)
Please go downstairs for labs.   I am concerned about your anemia and will recheck labs today.   I may also send you to [facility] for testing.   Start taking a Men's One A Day multivitamin

## 2022-09-11 NOTE — Progress Notes (Signed)
Subjective:     Patient ID: [name] [name], male    DOB: [birth year], 62 y.o.   MRN: [id number]  Chief Complaint  Patient presents with   Medical Management of Chronic Issues    HPI Patient is in today for follow up.   He met with the pharmacist who is assisting him with his medications prior to this visit. I do not have their recommendations at this point.   Anemia- denies bleeding.  Last colonoscopy 2017 at [facility].   Denies fever, chills, fatigue, dizziness, chest pain, palpitations, shortness of breath, abdominal pain, N/V/D, urinary symptoms, LE edema.   DM- states he is taking all of his medications.  Checked BS at home - 160s  Vitamin D def- taking prescription once weekly   Reports taking statin   Health Maintenance Due  Topic Date Due   FOOT EXAM  Never done   OPHTHALMOLOGY EXAM  Never done   COLONOSCOPY (Pts 45-38yr Insurance coverage will need to be confirmed)  Never done    Past Medical History:  Diagnosis Date   Arthritis    Depression    Diabetes mellitus    Hypertension     Past Surgical History:  Procedure Laterality Date   ANKLE SURGERY  1996   L ankle   CLAVICLE SURGERY  2010   JOINT REPLACEMENT     metal plate in lft knee   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   Procedure: RIGHT TOTAL HIP ARTHROPLASTY ANTERIOR APPROACH;  Surgeon: CMcarthur Rossetti MD;  Location: [facility];  Service: Orthopedics;  Laterality: Right;   UMBILICAL HERNIA REPAIR  2009    Family History  Problem Relation Age of Onset   Diabetes Mother    Stroke Mother    Diabetes Sister    Diabetes Sister    Diabetes Sister    Leukemia Maternal Aunt     Social History   Socioeconomic History   Marital status: Divorced    Spouse name: Not on file   Number of children: Not on file   Years of education: Not on file   Highest education level: Not on file  Occupational History   Not on file  Tobacco Use   Smoking status: Some Days    Types: Cigars    Smokeless tobacco: Never   Tobacco comments:    a cigar occasionally  Substance and Sexual Activity   Alcohol use: Yes    Comment: beer - occasionally   Drug use: No   Sexual activity: Not on file  Other Topics Concern   Not on file  Social History Narrative   Not on file   Social Determinants of Health   Financial Resource Strain: Not on file  Food Insecurity: Not on file  Transportation Needs: Not on file  Physical Activity: Not on file  Stress: Not on file  Social Connections: Not on file  Intimate Partner Violence: Not on file    Outpatient Medications Prior to Visit  Medication Sig Dispense Refill   amLODipine (NORVASC) 10 MG tablet Take 1 tablet (10 mg total) by mouth daily. 90 tablet 0   Ascorbic Acid (VITAMIN C) 1000 MG tablet Take 1,000 mg by mouth daily.     aspirin 325 MG tablet Take 325 mg by mouth daily.     atorvastatin (LIPITOR) 10 MG tablet Take 1 tablet (10 mg total) by mouth daily. 90 tablet 0   BD PEN NEEDLE NANO 2ND GEN 32G X 4 MM MISC See admin instructions.  Blood Glucose Monitoring Suppl (ONETOUCH VERIO FLEX SYSTEM) w/Device KIT by Does not apply route.     carvedilol (COREG) 12.5 MG tablet Take 1 tablet (12.5 mg total) by mouth 2 (two) times daily. 90 tablet 0   diclofenac (VOLTAREN) 75 MG EC tablet Take 75 mg by mouth 2 (two) times daily.     ergocalciferol (VITAMIN D2) 1.25 MG (50000 UT) capsule Take 1 capsule (50,000 Units total) by mouth once a week. 8 capsule 0   glipiZIDE (GLUCOTROL) 5 MG tablet Take 1 tablet (5 mg total) by mouth daily before breakfast. 90 tablet 0   glucose blood test strip Use as instructed to test blood sugars once while fasting and then 2 hours after a meal. (Twice daily) 100 each 5   hydrochlorothiazide (HYDRODIURIL) 25 MG tablet Take 1 tablet (25 mg total) by mouth daily. 90 tablet 0   Insulin Glargine (BASAGLAR KWIKPEN) 100 UNIT/ML Inject 10 Units into the skin daily. 15 mL 1   losartan (COZAAR) 100 MG tablet Take 1  tablet (100 mg total) by mouth every morning. 90 tablet 0   methocarbamol (ROBAXIN) 500 MG tablet Take 1 tablet (500 mg total) by mouth every 6 (six) hours as needed for muscle spasms. 40 tablet 1   OneTouch Delica Lancets 99991111 MISC by Does not apply route.     oxyCODONE (OXY IR/ROXICODONE) 5 MG immediate release tablet Take 5 mg by mouth every 4 (four) hours as needed for severe pain.     pyridoxine (B-6) 100 MG tablet Take 100 mg by mouth daily.     traMADol (ULTRAM) 50 MG tablet Take 50 mg by mouth every 6 (six) hours as needed for severe pain.     Semaglutide, 1 MG/DOSE, (OZEMPIC, 1 MG/DOSE,) 4 MG/3ML SOPN Inject 1 mg into the skin once a week.     No facility-administered medications prior to visit.    No Known Allergies  ROS     Objective:    Physical Exam Constitutional:      General: He is not in acute distress.    Appearance: He is not ill-appearing.  Cardiovascular:     Rate and Rhythm: Normal rate.  Pulmonary:     Effort: Pulmonary effort is normal.  Musculoskeletal:     Cervical back: Normal range of motion.  Skin:    General: Skin is dry.  Neurological:     General: No focal deficit present.     Mental Status: He is alert and oriented to person, place, and time.  Psychiatric:        Mood and Affect: Mood normal.        Behavior: Behavior normal.     BP 136/86 (BP Location: Left Arm, Patient Position: Sitting, Cuff Size: Large)   Pulse 82   Temp 97.6 F (36.4 C) (Temporal)   Ht '5\' 10"'$  (1.778 m)   SpO2 98%   BMI 29.41 kg/m  Wt Readings from Last 3 Encounters:  08/30/22 205 lb (93 kg)  03/06/22 209 lb (94.8 kg)  01/12/22 214 lb (97.1 kg)       Assessment & Plan:   Problem List Items Addressed This Visit       Cardiovascular and Mediastinum   Primary hypertension     Endocrine   Hyperlipidemia associated with type 2 diabetes mellitus ([place])   Relevant Medications   Dulaglutide (TRULICITY) A999333 0000000 SOPN   Uncontrolled type 2 diabetes  mellitus with hyperglycemia (HCC)   Relevant Medications   Dulaglutide (  TRULICITY) A999333 0000000 SOPN     Other   Noncompliance with medications   Vitamin D deficiency   Other Visit Diagnoses     Anemia, unspecified type    -  Primary   Relevant Orders   CBC with Differential/Platelet (Completed)   Folate (Completed)   Iron, TIBC and Ferritin Panel   Neutropenia, unspecified type [facility])          He is now taking all of his medications. Pharmacist is assisting him with medication refills and compliance. He is feeling well today.  Discussed that he is anemic and this needs to be worked up.  I will check CBC, folate and iron studies. Referral to GI pending results. Last colonoscopy in 2017 at [facility]. I have requested this report.  Follow up pending labs or in 3 months.   I have changed Maximino Greenland Baillargeon's Ozempic (1 MG/DOSE) to Trulicity. I am also having him maintain his methocarbamol, traMADol, OneTouch Verio Flex System, BD Pen Needle Nano 2nd Gen, OneTouch Delica Lancets 99991111, glucose blood, diclofenac, vitamin C, pyridoxine, amLODipine, atorvastatin, glipiZIDE, hydrochlorothiazide, Basaglar KwikPen, losartan, carvedilol, ergocalciferol, oxyCODONE, and aspirin.  Meds ordered this encounter  Medications   Dulaglutide (TRULICITY) A999333 0000000 SOPN    Sig: Inject 0.75 mg into the skin once a week.    Dispense:  2 mL    Refill:  2

## 2022-09-11 NOTE — Progress Notes (Signed)
09/11/2022  Patient ID: [name] [name], male   DOB: 1960-09-22, 62 y.o.   MRN: [id number]  Follow-up appointment for medication management completed at [facility] prior to patient's scheduled PCP appointment with Harland Dingwall.  Subjective/Objective:  Medication Management and Adherence - During previous appointments with PCP and myself, it appeared that patient was not taking medications as prescribed due to confusion around regimen and affordability barrier [employer] premium had not been paid, so claims were not being picked up by patient's plan, and medications were not affordable for him.  Able to get these filled for zero or low copays at our [place] for the time being. - He is unable to confirm if premium was paid and insurance is active - Patient presents today with bottles from home, and is able to state injectables (Ozempic and Engineer, agricultural) he is currently taking - States adherence has been good since our last visit 08/31/22; however, it appears prescriptions sent in 08/30/22 were not picked up until at least 3/6 per messages sent to patient from pharmacy.  These medications were with his others today, though. - Medication list in system matched what patient brought in or verbalized (for injectables) with the exception of Ozempic.  He states he is using the strength that comes in blue/white box, which is '1mg'$  versus previously documented '2mg'$ . - Patient endorses not taking medications this morning prior to his visit this morning  HTN - BP at today's visit 136/86 - States he checks at home on occasion but unable to provide values - Does not endorse any s/sx of hypo or hypertension - Currently taking:  amlodipine '10mg'$  daily, carvedilol 12.'5mg'$  BID, hctz '25mg'$  daily, and losartan '100mg'$  daily - Has been on regimen approximately 1 week based on last pick up date  DM - Endorses having 1 full pen left of both Basaglar and Ozempic, which  is a 1 month supply - Current medications: Basaglar 20 units at night, Ozempic '1mg'$  weekly, Glipizide '5mg'$  daily  - Has not been taking BG at home due to being out of test strips - States he has not experienced any s/sx of hypoglycemia recently - Does endorse GI upset (n/v) the first day of Ozempic injection but states this has improved since he initially started medication - States he had been on Metformin in the past but cannot recall why medication was stopped - Initial endo appt scheduled for 3/20  Assessment/Plan:  Medication Management and Adherence - [place] pharmacy to run test claim on patient's insurance, and the premium has not been paid; so the insurance is not contributing to medication costs still.   - Called to notify patient, and he stated he has reached out to his insurance agent that assisted in signing up for plan to look at other options for him.   - Verified household income, and he is not eligible for Medicaid (other than family planning that he already has), even with recent expansion - I am looking into other alternatives so he does not go without medications when this supply is gone just in case  HTN - Clinic BP is some improved from last visit - Recommend patient continue current regimen for now, as likely only been taking each medication as directed for a week.  Should have 3 weeks left of medications. - Educated the importance of checking BP at home at least once or twice a week - Reminded patient to take medications prior to appointments  unless advised not to, so we can get accurate BP measure with these on board  DM - Patient has enough medication for the next month, and I recommend continuation of current therapy for now - Recommend follow-up A1c in 3 months to determine efficacy of regimen - Educated patient to start checking FBG and 2 hour post-prandial once he is able to obtain test strips - I am looking into getting low/no cost  diabetic supplies, so he can test at home.  Follow-up:  Telephone follow-up scheduled in 2 weeks to see if he has an update on insurance.    Darlina Guys, PharmD, DPLA

## 2022-09-12 ENCOUNTER — Other Ambulatory Visit: Payer: Self-pay | Admitting: Family Medicine

## 2022-09-12 DIAGNOSIS — D649 Anemia, unspecified: Secondary | ICD-10-CM

## 2022-09-12 LAB — IRON,TIBC AND FERRITIN PANEL
%SAT: 20 % (calc) (ref 20–48)
Ferritin: 383 ng/mL — ABNORMAL HIGH (ref 24–380)
Iron: 52 ug/dL (ref 50–180)
TIBC: 265 mcg/dL (calc) (ref 250–425)

## 2022-09-12 NOTE — Progress Notes (Signed)
His iron level is normal. At this point, based on his labs, I recommend seeing GI in regards to anemia. I will place referral and ask him to schedule with them.

## 2022-09-13 ENCOUNTER — Telehealth: Payer: Self-pay

## 2022-09-13 NOTE — Progress Notes (Signed)
   09/13/2022  Patient ID: [name] [name], male   DOB: 1961/06/16, 62 y.o.   MRN: [id number]  MyChart message sent to patient for recommendation around diabetic testing supplies for interim period while insurance is not covering prescription claims.  It is actually cheaper for him to purchase new kit with meter and 30 days of supplies through [employer] than out right buy strips for current meter.  Will also give head's up to endocrinology -in case he is not able to obtain prior to scheduled appointment with them next week -to see if they have any testing supply samples.  Darlina Guys, PharmD, DPLA

## 2022-09-19 ENCOUNTER — Other Ambulatory Visit: Payer: Self-pay

## 2022-09-19 ENCOUNTER — Ambulatory Visit: Payer: Commercial Managed Care - HMO | Admitting: Internal Medicine

## 2022-09-19 ENCOUNTER — Encounter: Payer: Self-pay | Admitting: Internal Medicine

## 2022-09-19 VITALS — BP 120/80 | HR 74 | Ht 70.0 in | Wt 207.0 lb

## 2022-09-19 DIAGNOSIS — E1142 Type 2 diabetes mellitus with diabetic polyneuropathy: Secondary | ICD-10-CM | POA: Diagnosis not present

## 2022-09-19 DIAGNOSIS — E1165 Type 2 diabetes mellitus with hyperglycemia: Secondary | ICD-10-CM | POA: Diagnosis not present

## 2022-09-19 DIAGNOSIS — Z794 Long term (current) use of insulin: Secondary | ICD-10-CM

## 2022-09-19 LAB — POCT GLUCOSE (DEVICE FOR HOME USE): POC Glucose: 186 mg/dl — AB (ref 70–99)

## 2022-09-19 MED ORDER — LANTUS SOLOSTAR 100 UNIT/ML ~~LOC~~ SOPN
10.0000 [IU] | PEN_INJECTOR | Freq: Every day | SUBCUTANEOUS | 2 refills | Status: DC
  Filled 2022-09-19: qty 9, 84d supply, fill #0
  Filled 2022-11-05: qty 3, 28d supply, fill #0

## 2022-09-19 MED ORDER — PEN NEEDLES 32G X 4 MM MISC
1.0000 | Freq: Every day | 2 refills | Status: DC
  Filled 2022-09-19: qty 100, 25d supply, fill #0
  Filled 2022-11-05: qty 100, 90d supply, fill #0
  Filled 2023-01-02: qty 100, 25d supply, fill #0
  Filled 2023-06-03: qty 100, 25d supply, fill #1

## 2022-09-19 MED ORDER — GLIPIZIDE 5 MG PO TABS
5.0000 mg | ORAL_TABLET | Freq: Two times a day (BID) | ORAL | 2 refills | Status: DC
  Filled 2022-09-19 – 2022-11-20 (×3): qty 180, 90d supply, fill #0
  Filled 2023-06-03: qty 180, 90d supply, fill #1

## 2022-09-19 MED ORDER — TRULICITY 1.5 MG/0.5ML ~~LOC~~ SOAJ
1.5000 mg | SUBCUTANEOUS | 3 refills | Status: DC
  Filled 2022-09-19: qty 2, 28d supply, fill #0
  Filled 2022-11-05: qty 6, 84d supply, fill #0
  Filled 2022-11-20: qty 2, 28d supply, fill #0
  Filled 2023-01-02: qty 2, 28d supply, fill #1
  Filled 2023-06-03: qty 2, 28d supply, fill #2

## 2022-09-19 NOTE — Patient Instructions (Addendum)
Take Glipizide 5 mg , 1 tablet before Breakfast and 1 tablet before Supper Trulicity 1.5 mg once weekly  Continue Basaglar 10 units daily     HOW TO TREAT LOW BLOOD SUGARS (Blood sugar LESS THAN 70 MG/DL) Please follow the RULE OF 15 for the treatment of hypoglycemia treatment (when your (blood sugars are less than 70 mg/dL)   STEP 1: Take 15 grams of carbohydrates when your blood sugar is low, which includes:  3-4 GLUCOSE TABS  OR 3-4 OZ OF JUICE OR REGULAR SODA OR ONE TUBE OF GLUCOSE GEL    STEP 2: RECHECK blood sugar in 15 MINUTES STEP 3: If your blood sugar is still low at the 15 minute recheck --> then, go back to STEP 1 and treat AGAIN with another 15 grams of carbohydrates.

## 2022-09-19 NOTE — Progress Notes (Signed)
Name: [name] [name]  MRN/ DOB: [id number], Sep 16, 1960   Age/ Sex: 62 y.o., male    PCP: Girtha Rm, NP-C   Reason for Endocrinology Evaluation: Type 2 Diabetes Mellitus     Date of Initial Endocrinology Visit: 09/19/2022     PATIENT IDENTIFIER: [name] [name] is a 62 y.o. male with a past medical history of DM, Htn and Dyslipidemia . The patient presented for initial endocrinology clinic visit on 09/19/2022 for consultative assistance with his diabetes management.    HPI: [name] was    Diagnosed with DM 2010 Prior Medications tried/Intolerance: has been on insulin since 2022 Currently checking blood sugars occasionally, the strips are cost prohibitive .  Hypoglycemia episodes : not recently               Symptoms: yes                 Hemoglobin A1c has ranged from 9.1% in 2023, peaking at 10.5% in 2014.   In terms of diet, the patient eats 3 meals a day, snacks occasionally. Drinks Sugar-sweetened beverages   Has chronic right arm tingling and should pain   The patient has Trulicity on his medication list, but he tells me he still has Ozempic at home  Denies any GI symptoms  HOME DIABETES REGIMEN: Glipizide 5 mg daily Ozempic 1 mg weekly  Basaglar 10 units daily    Statin: yes ACE-I/ARB: Yes    METER DOWNLOAD SUMMARY: did not bring    DIABETIC COMPLICATIONS: Microvascular complications:  Neuropathy  Denies: CKD Last eye exam: Completed > 2 yrs  Macrovascular complications:   Denies: CAD, PVD, CVA   PAST HISTORY: Past Medical History:  Past Medical History:  Diagnosis Date   Arthritis    Depression    Diabetes mellitus    Hypertension    Past Surgical History:  Past Surgical History:  Procedure Laterality Date   ANKLE SURGERY  1996   L ankle   CLAVICLE SURGERY  2010   JOINT REPLACEMENT     metal plate in lft knee   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 He has never used smokeless tobacco. He reports current alcohol use. He reports that he does not use drugs. Family History:  Family History  Problem Relation Age of Onset   Diabetes Mother    Stroke Mother    Diabetes Sister    Diabetes Sister    Diabetes Sister    Leukemia Maternal Aunt      HOME MEDICATIONS: Allergies as of 09/19/2022   No Known Allergies      Medication List        Accurate as of September 19, 2022  3:15 PM. If you have any questions, ask your nurse or doctor.          amLODipine 10 MG tablet Commonly known as: NORVASC Take 1 tablet (10 mg total) by mouth daily.   aspirin 325 MG tablet Take 325 mg by mouth daily.   atorvastatin 10 MG tablet Commonly known as: LIPITOR Take 1 tablet (10 mg total) by mouth daily.   Basaglar KwikPen 100 UNIT/ML Inject 10 Units into the skin daily.   BD Pen Needle Nano 2nd Gen 32G  X 4 MM Misc Generic drug: Insulin Pen Needle See admin instructions.   carvedilol 12.5 MG tablet Commonly known as: COREG Take 1 tablet (12.5 mg total) by mouth 2 (two) times daily.   diclofenac 75 MG EC tablet Commonly known as: VOLTAREN Take 75 mg by mouth 2 (two) times daily.   glipiZIDE 5 MG tablet Commonly known as: GLUCOTROL Take 1 tablet (5 mg total) by mouth daily before breakfast.   glucose blood test strip Use as instructed to test blood sugars once while fasting and then 2 hours after a meal. (Twice daily)   hydrochlorothiazide 25 MG tablet Commonly known as: HYDRODIURIL Take 1 tablet (25 mg total) by mouth daily.   losartan 100 MG tablet Commonly known as: COZAAR Take 1 tablet (100 mg total) by mouth every morning.   methocarbamol 500 MG tablet Commonly known as: ROBAXIN Take 1 tablet (500 mg total) by mouth every 6 (six) hours as  needed for muscle spasms.   OneTouch Delica Lancets 99991111 Misc by Does not apply route.   OneTouch Verio Flex System w/Device Kit by Does not apply route.   oxyCODONE 5 MG immediate release tablet Commonly known as: Oxy IR/ROXICODONE Take 5 mg by mouth every 4 (four) hours as needed for severe pain.   pyridoxine 100 MG tablet Commonly known as: B-6 Take 100 mg by mouth daily.   traMADol 50 MG tablet Commonly known as: ULTRAM Take 50 mg by mouth every 6 (six) hours as needed for severe pain.   Trulicity A999333 0000000 Sopn Generic drug: Dulaglutide Inject 0.75 mg into the skin once a week.   vitamin C 1000 MG tablet Take 1,000 mg by mouth daily.   Vitamin D (Ergocalciferol) 1.25 MG (50000 UNIT) Caps capsule Commonly known as: DRISDOL Take 1 capsule (50,000 Units total) by mouth once a week.         ALLERGIES: No Known Allergies   REVIEW OF SYSTEMS: A comprehensive ROS was conducted with the patient and is negative except as per HPI and below:  Review of Systems  Gastrointestinal:  Negative for nausea and vomiting.  Neurological:  Positive for tingling.       Right arm       OBJECTIVE:   VITAL SIGNS: BP 120/80 (BP Location: Left Arm, Patient Position: Sitting, Cuff Size: Large)   Pulse 74   Ht 5\' 10"  (1.778 m)   Wt 207 lb (93.9 kg)   SpO2 96%   BMI 29.70 kg/m    PHYSICAL EXAM:  General: Pt appears well and is in NAD  Neck: General: Supple without adenopathy or carotid bruits. Thyroid: Thyroid size normal.  No goiter or nodules appreciated.   Lungs: Clear with good BS bilat with no rales, rhonchi, or wheezes  Heart: RRR   Abdomen:  soft, nontender  Extremities:  Lower extremities - No pretibial edema. No lesions.  Neuro: MS is good with appropriate affect, pt is alert and Ox3    DM foot exam: 09/19/2022   The skin of the feet is intact without sores or ulcerations. The pedal pulses are 2+ on right and 2+ on left. The sensation is absent to a  screening 5.07, 10 gram monofilament at the left great toe   DATA REVIEWED:  Lab Results  Component Value Date   HGBA1C 9.7 (H) 08/30/2022   HGBA1C 9.1 (H) 01/12/2022   HGBA1C 10.5 (H) 05/15/2013   Lab Results  Component Value Date   MICROALBUR 4.6 (H) 08/30/2022   [place]  47 08/30/2022   CREATININE 1.42 08/30/2022    Latest Reference Range & Units 08/30/22 10:37  Sodium 135 - 145 mEq/L 140  Potassium 3.5 - 5.1 mEq/L 3.8  Chloride 96 - 112 mEq/L 102  CO2 19 - 32 mEq/L 28  Glucose 70 - 99 mg/dL 134 (H)  BUN 6 - 23 mg/dL 29 (H)  Creatinine 0.40 - 1.50 mg/dL 1.42  Calcium 8.4 - 10.5 mg/dL 9.8  Alkaline Phosphatase 39 - 117 U/L 62  Albumin 3.5 - 5.2 g/dL 4.2  AST 0 - 37 U/L 24  ALT 0 - 53 U/L 24  Total Protein 6.0 - 8.3 g/dL 7.6  Total Bilirubin 0.2 - 1.2 mg/dL 0.5  GFR >60.00 mL/min 53.38 (L)    Latest Reference Range & Units 08/30/22 10:37  Total CHOL/HDL Ratio  4  Cholesterol 0 - 200 mg/dL 110  HDL Cholesterol >39.00 mg/dL 28.00 (L)  LDL (calc) 0 - 99 mg/dL 47  MICROALB/CREAT RATIO 0.0 - 30.0 mg/g 3.8  NonHDL  82.46  Triglycerides 0.0 - 149.0 mg/dL 177.0 (H)  VLDL 0.0 - 40.0 mg/dL 35.4   Old records , labs and images have been reviewed.   ASSESSMENT / PLAN / RECOMMENDATIONS:   1) Type 2 Diabetes Mellitus, Poorly controlled, With neuropathic  complications - Most recent A1c of 9.7 %. Goal A1c < 7.0 %.    GENERAL: I have discussed with the patient the pathophysiology of diabetes. We went over the natural progression of the disease. We stressed the importance of lifestyle changes. I explained the complications associated with diabetes including retinopathy, nephropathy, neuropathy as well as increased risk of cardiovascular disease. We went over the benefit seen with glycemic control.   I explained to the patient that diabetic patients are at higher than normal risk for amputations.  Patient was advised to contact his insurance company to see which glucose meter is  covered so I can prescribe it for him He was advised to avoid sugar sweetened beverages, we discussed avoiding snacks if possible, we discussed options of low-carb snacks as an alternative I will increase his glipizide as well as Trulicity as below, we discussed the importance of taking glipizide before the first and the last meal of the day due to the risk of hypoglycemia if taking without eating No changes to the insulin at this time  MEDICATIONS: Increase glipizide 5 mg, 1 tab twice daily Increase Trulicity 1.5 mg once weekly Continue Basaglar 10 units daily  EDUCATION / INSTRUCTIONS: BG monitoring instructions: Patient is instructed to check his blood sugars 1 times a day, fasting. Call [facility] [facility] if: BG persistently < 70  I reviewed the Rule of 15 for the treatment of hypoglycemia in detail with the patient. Literature supplied.   2) Diabetic complications:  Eye: Does not have known diabetic retinopathy.  Patient encouraged to have an eye exam Neuro/ Feet: Does have known diabetic peripheral neuropathy. Renal: Patient does not have known baseline CKD. He is  on an ACEI/ARB at present.      Signed electronically by: Mack Guise, MD  [facility]  [facility] [address] [place], [place] [address] Phone: [phone] FAX: [phone]   CC: Girtha Rm, NP-C [place] [address] Phone: [phone]  Fax: [phone]    Return to Endocrinology clinic as below: Future Appointments  Date Time Provider [place]  09/25/2022 10:00 AM Darlina Guys, [facility] None

## 2022-09-25 ENCOUNTER — Other Ambulatory Visit: Payer: Commercial Managed Care - HMO

## 2022-09-25 NOTE — Progress Notes (Signed)
   09/25/2022  Patient ID: [name] [name], male   DOB: [birth year], 62 y.o.   MRN: [id number]  Subjective/Objective: Telephone follow-up to check on home BG and status of patient's insurance.  DM -Patient states most recent BG reading was yesterday <1h post-prandial: 180 -Still has Basaglar and Ozempic 1mg  on hand; approximately 4 week supply -Has not picked up increased glipizide dose from pharmacy yet -Now has pharmacy discount card but still owes >$500 in insurance premiums; so they are still not paying on claims  Assessment/Plan: DM -Patient aware updated glipizide script is at [place] location for him to pick up and start  -Provided phone number to see if his Affordable Care Act plan is eligible for savings through tax credit or otherwise -Follow up in 2 weeks before he will run out of insulin and Ozempic to check on coverage.  Pharmacy typically only able to provide PAP medications if NO insurance and only for 30 days, which patient already received.  Can have then run test claims on discount card but will likely need to consider alternatives:  [place] [facility] if coverage is terminated, other medications if still technically active.  Darlina Guys, PharmD, DPLA

## 2022-10-09 ENCOUNTER — Other Ambulatory Visit: Payer: Commercial Managed Care - HMO

## 2022-10-09 NOTE — Progress Notes (Signed)
   10/09/2022  Patient ID: [name] [name], male   DOB: 1960/09/02, 62 y.o.   MRN: [id number]  Subjective/Objective: Telephone follow-up to check on status of patient's insurance and supply on hand of maintenance medications.   -Patient states he has not contacted anyone yet about his insurance coverage, to either pay premiums due or look into different plans.  Endorses that he will do that this week. -States he is not out of any maintenance medications but would be running low based on last fill date.  He plans to go to [facility] to pick up refills today. -He currently has 2 weeks of Basaglar and Ozempic on hand; prescriptions are on file at [facility] for Lantus and Trulicity from endocrinology -Has 1 bottle of test strips remaining and is not checking daily to preserve these.  BG 2 days ago <1 hour after eating was approximately 200.  Assessment/Plan: -Telephone visit scheduled to follow-up with patient in one week before insulin will run out -Will verify insurance coverage/termination and if patient refilled maintenance medications -If no insurance, evaluate eligibility for  MedAssist and/or PAP programs -Can continue to fill generic medications at low cost with [facility] if not eligible for Med Assist or PAP.  They may also be able to provide insulin and GLP1 at no/low cost depending on availability.  Lenna Gilford, PharmD, DPLA

## 2022-10-10 ENCOUNTER — Other Ambulatory Visit: Payer: Self-pay | Admitting: Family Medicine

## 2022-10-10 ENCOUNTER — Ambulatory Visit: Payer: Self-pay

## 2022-10-10 DIAGNOSIS — M79662 Pain in left lower leg: Secondary | ICD-10-CM

## 2022-10-16 ENCOUNTER — Other Ambulatory Visit: Payer: Medicaid Other

## 2022-10-16 NOTE — Progress Notes (Signed)
   10/16/2022  Patient ID: [name] [name], male   DOB: 01/11/1961, 62 y.o.   MRN: [id number]  Subjective/Objective: Telephone follow-up visit with patient to check on status of insurance and supply of maintenance medications.  Medication Assistance -Patient states there are a couple of medications he is running low on, but he is not at home to clarify which ones and what supply he has -It appears maintenance meds sent to [facility] should have at least 1 refill remaining on them -Mr. Osso endorses that he has signed up for a new BCBS plan through marketplace, and coverage will being 5/1  Assessment/Plan:  Medication Assistance -Recommended that patient contact [facility] for refills on the medications he is running low on -Educated that once new insurance is active we will likely need to look into the medication formulary to see if any current medications (GLP1 or insulin) require a PA or change to a preferred alternative -Follow-up scheduled for 5/6 once insurance is active, but patient has been instructed to reach me if access issues arise before then to make sure he does not go without medications  Lenna Gilford, PharmD, DPLA

## 2022-11-05 ENCOUNTER — Other Ambulatory Visit: Payer: Self-pay

## 2022-11-05 ENCOUNTER — Other Ambulatory Visit: Payer: Medicaid Other

## 2022-11-05 NOTE — Progress Notes (Unsigned)
   11/05/2022  Patient ID: Sheran Luz, male   DOB: [birth year], 62 y.o.   MRN: [id number]  Subjective/Objective:  Medication Adherence/Access/Affordability -New insurance as of 5/1; able to locate information through Epic and verify [employer] has updated information, too -Patient is needing refills on all medications -Contacted pharmacy at [facility] to have them process prescription with refills remaining to make sure these are covered under new insurance plan -Need to send in new orders for carvedilol and aspirin -Insurance also prefers Software engineer -Needing to verify which testing supplies patient is currently using and send order to pharmacy; new plan prefers [employer] -Trulicity (and all GLP1's) requires a prior authorization -All other refills for oral maintenance medications currently going through at $0 for patient  Assessment/Plan:  Medication Adherence/Access/Affordability -Contacted patient to verify what meter he uses - he will look when he gets home and let me know when I call tomorrow- will then pend prescriptions appropriately  -Informed patient of insurance coverage and that [facility] is working on prescriptions; let him know that long acting insulin will now be Semglee -Pending prescriptions for Semglee, carvedilol, and aspirin for PCP to sign if in agreement -Initiated PA for Trulicity in [employer]; faxed to PCP's office for completion/submission to plan  Follow-up:  Calling tomorrow to get testing supply information and will pend scripts accordingly and schedule f/u at that time  Lenna Gilford, PharmD, DPLA

## 2022-11-06 ENCOUNTER — Other Ambulatory Visit: Payer: Self-pay

## 2022-11-06 MED ORDER — ASPIRIN 325 MG PO TABS
325.0000 mg | ORAL_TABLET | Freq: Every day | ORAL | 3 refills | Status: DC
  Filled 2022-11-06: qty 90, 90d supply, fill #0

## 2022-11-06 MED ORDER — CARVEDILOL 12.5 MG PO TABS
12.5000 mg | ORAL_TABLET | Freq: Two times a day (BID) | ORAL | 3 refills | Status: DC
  Filled 2022-11-06 – 2022-11-20 (×2): qty 180, 90d supply, fill #0
  Filled 2023-06-03: qty 180, 90d supply, fill #1

## 2022-11-06 MED ORDER — INSULIN GLARGINE-YFGN 100 UNIT/ML ~~LOC~~ SOPN
10.0000 [IU] | PEN_INJECTOR | Freq: Every day | SUBCUTANEOUS | 3 refills | Status: DC
  Filled 2022-11-06: qty 9, 90d supply, fill #0

## 2022-11-06 MED ORDER — GLUCOSE BLOOD VI STRP
ORAL_STRIP | 12 refills | Status: DC
  Filled 2022-11-06: qty 100, 50d supply, fill #0

## 2022-11-06 NOTE — Addendum Note (Signed)
Addended by: Sabino Niemann A on: 11/06/2022 05:55 PM   Modules accepted: Orders

## 2022-11-06 NOTE — Addendum Note (Signed)
Addended by: Avanell Shackleton on: 11/06/2022 06:22 PM   Modules accepted: Orders

## 2022-11-06 NOTE — Progress Notes (Signed)
   11/06/2022  Patient ID: [name] [name], male   DOB: February 11, 1961, 62 y.o.   MRN: [id number]  Contacted [name] [name] today, and he has a Community education officer.  Pending a refill for test strips for PCP to sign.  States he has plenty of lancets at this time.  Will contact pharmacy once order signed to verify coverage on his new plan.    Lenna Gilford, PharmD, DPLA

## 2022-11-07 ENCOUNTER — Other Ambulatory Visit: Payer: Self-pay

## 2022-11-07 ENCOUNTER — Telehealth: Payer: Self-pay

## 2022-11-07 NOTE — Telephone Encounter (Signed)
PA started for Trulicity   Key: [id number]

## 2022-11-08 ENCOUNTER — Other Ambulatory Visit: Payer: Self-pay

## 2022-11-12 ENCOUNTER — Other Ambulatory Visit: Payer: Self-pay

## 2022-11-14 ENCOUNTER — Other Ambulatory Visit: Payer: Self-pay

## 2022-11-15 ENCOUNTER — Telehealth: Payer: Self-pay

## 2022-11-15 ENCOUNTER — Other Ambulatory Visit: Payer: Self-pay

## 2022-11-15 NOTE — Progress Notes (Signed)
   11/15/2022  Patient ID: [name] [name], male   DOB: [birth year], 62 y.o.   MRN: [id number]  Prior authorization for Trulicity was approved.  Contacted pharmacy, and claim is going through for $25/month.    Contacted patient to inform him, and he says this will be affordable for him.  He did endorse he has been out of town for a graduation, so he still has not picked up other prescriptions from 5/7.  Plans to pick those and the Trulicity up this week.  Will contact him next week to make sure everything was received and see how home BG is.  Lenna Gilford, PharmD, DPLA

## 2022-11-15 NOTE — Telephone Encounter (Signed)
PA has been APPROVED  Authorization Expiration Date: 11/07/2023

## 2022-11-16 ENCOUNTER — Other Ambulatory Visit: Payer: Self-pay

## 2022-11-20 ENCOUNTER — Other Ambulatory Visit: Payer: Self-pay

## 2022-11-20 NOTE — Progress Notes (Signed)
   [name] [name] [birth year] [id number]  Patient seen by Buddy Duty , PharmD Candidate on 11/20/22.  Blood Pressure Readings: Systolic BP today: 152 Diastolic BP today: 84 Systolic BP on recheck (if SBP >140 or DBP >90): 148 Diastolic BP on recheck (if SBP >140 or DBP >90): 84 Does the patient have a validated home blood pressure machine?: Yes They report home readings n/a  Medication review was performed. Is the patient taking their medications as prescribed?: Yes   The following barriers to adherence were noted:     Interventions:    The patient has follow up scheduled:  PCP: Avanell Shackleton, NP-C   [facility], Student-PharmD

## 2022-11-27 ENCOUNTER — Encounter: Payer: Self-pay | Admitting: Gastroenterology

## 2022-12-05 ENCOUNTER — Ambulatory Visit: Payer: Commercial Managed Care - HMO | Admitting: Internal Medicine

## 2022-12-17 ENCOUNTER — Encounter: Payer: Self-pay | Admitting: Internal Medicine

## 2022-12-17 ENCOUNTER — Other Ambulatory Visit: Payer: Self-pay

## 2022-12-17 ENCOUNTER — Ambulatory Visit (INDEPENDENT_AMBULATORY_CARE_PROVIDER_SITE_OTHER): Payer: BLUE CROSS/BLUE SHIELD | Admitting: Internal Medicine

## 2022-12-17 VITALS — BP 130/70 | HR 73 | Ht 70.0 in | Wt 204.6 lb

## 2022-12-17 DIAGNOSIS — E1142 Type 2 diabetes mellitus with diabetic polyneuropathy: Secondary | ICD-10-CM | POA: Diagnosis not present

## 2022-12-17 DIAGNOSIS — E1165 Type 2 diabetes mellitus with hyperglycemia: Secondary | ICD-10-CM | POA: Diagnosis not present

## 2022-12-17 DIAGNOSIS — Z794 Long term (current) use of insulin: Secondary | ICD-10-CM | POA: Diagnosis not present

## 2022-12-17 LAB — POCT GLYCOSYLATED HEMOGLOBIN (HGB A1C): Hemoglobin A1C: 9.3 % — AB (ref 4.0–5.6)

## 2022-12-17 LAB — GLUCOSE, POCT (MANUAL RESULT ENTRY): POC Glucose: 201 mg/dl — AB (ref 70–99)

## 2022-12-17 MED ORDER — INSULIN GLARGINE-YFGN 100 UNIT/ML ~~LOC~~ SOPN
12.0000 [IU] | PEN_INJECTOR | Freq: Every day | SUBCUTANEOUS | 3 refills | Status: DC
  Filled 2022-12-17: qty 9, 75d supply, fill #0
  Filled 2023-01-02: qty 6, 50d supply, fill #0
  Filled 2023-06-03: qty 6, 50d supply, fill #1

## 2022-12-17 NOTE — Progress Notes (Signed)
Name: [name] [name]  MRN/ DOB: [id number], 1960-11-09   Age/ Sex: 62 y.o., male    PCP: Avanell Shackleton, NP-C   Reason for Endocrinology Evaluation: Type 2 Diabetes Mellitus     Date of Initial Endocrinology Visit: 09/19/2022    PATIENT IDENTIFIER: [name] is a 62 y.o. male with a past medical history of DM, Htn and Dyslipidemia . The patient presented for initial endocrinology clinic visit on 09/19/2022 for consultative assistance with his diabetes management.    HPI: [name] [name] was    Diagnosed with DM 2010 Prior Medications tried/Intolerance: has been on insulin since 2022 Hemoglobin A1c has ranged from 9.1% in 2023, peaking at 10.5% in 2014.    Has chronic right arm tingling and should pain   The patient has Trulicity on his medication list, but he tells me he still has Ozempic at home   On his initial visit to our clinic he had an A1c of 9.7%, he was on glipizide, Basaglar, and a GLP-1 agonist.  I have continued Basaglar, increase glipizide and Trulicity  SUBJECTIVE:   During the last visit (09/19/2022): A1c 9.7%  Today (12/17/22): [name] [name] is here for follow-up on diabetes management.  He checks his blood sugars occasionally  times daily. The patient has not had hypoglycemic episodes .  Pt admits to dietary indiscretions with occasional sugar-sweetened beverages   Forgets to take insulin 2 days a week  He forgets to take his trulicity  He takes it once  He has been stressed out lately, as his wife has CVA  Has nausea and vomiting  with Ozempic but no issues with Trulicity   HOME DIABETES REGIMEN: Glipizide 5 mg twice daily Trulicity 1.5 mg weekly Semglee 10 units daily      Statin: yes ACE-I/ARB: Yes    METER DOWNLOAD SUMMARY: did not bring    DIABETIC COMPLICATIONS: Microvascular complications:  Neuropathy  Denies: CKD Last eye exam: Completed > 2 yrs  Macrovascular complications:   Denies: CAD, PVD, CVA   PAST  HISTORY: Past Medical History:  Past Medical History:  Diagnosis Date   Arthritis    Depression    Diabetes mellitus    Hypertension    Past Surgical History:  Past Surgical History:  Procedure Laterality Date   ANKLE SURGERY  1996   L ankle   CLAVICLE SURGERY  2010   JOINT REPLACEMENT     metal plate in lft knee   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 He has never used smokeless tobacco. He reports current alcohol use. He reports that he does not use drugs. Family History:  Family History  Problem Relation Age of Onset   Diabetes Mother    Stroke Mother    Diabetes Sister    Diabetes Sister    Diabetes Sister    Leukemia Maternal Aunt      HOME MEDICATIONS: Allergies as of 12/17/2022   No Known Allergies      Medication List        Accurate as of December 17, 2022  8:05 AM. If you have any questions, ask your nurse or doctor.          amLODipine 10 MG tablet Commonly known as: NORVASC  Take 1 tablet (10 mg total) by mouth daily.   aspirin 325 MG tablet Take 1 tablet (325 mg total) by mouth daily.   atorvastatin 10 MG tablet Commonly known as: LIPITOR Take 1 tablet (10 mg total) by mouth daily.   carvedilol 12.5 MG tablet Commonly known as: COREG Take 1 tablet (12.5 mg total) by mouth 2 (two) times daily with a meal.   diclofenac 75 MG EC tablet Commonly known as: VOLTAREN Take 75 mg by mouth 2 (two) times daily.   glipiZIDE 5 MG tablet Commonly known as: GLUCOTROL Take 1 tablet (5 mg total) by mouth 2 (two) times daily before a meal.   glucose blood test strip Use to test blood glucose twice daily: once before eating and again 2 hours after   hydrochlorothiazide 25 MG tablet Commonly known as:  HYDRODIURIL Take 1 tablet (25 mg total) by mouth daily.   insulin glargine-yfgn 100 UNIT/ML Pen Commonly known as: Semglee (yfgn) Inject 12 Units into the skin daily. What changed: how much to take Changed by: Scarlette Shorts, MD   losartan 100 MG tablet Commonly known as: COZAAR Take 1 tablet (100 mg total) by mouth every morning.   methocarbamol 500 MG tablet Commonly known as: ROBAXIN Take 1 tablet (500 mg total) by mouth every 6 (six) hours as needed for muscle spasms.   OneTouch Delica Lancets 30G Misc by Does not apply route.   OneTouch Verio Flex System w/Device Kit by Does not apply route.   oxyCODONE 5 MG immediate release tablet Commonly known as: Oxy IR/ROXICODONE Take 5 mg by mouth every 4 (four) hours as needed for severe pain.   Pen Needles 32G X 4 MM Misc USE AS DIRECTED WITH INSULIN   pyridoxine 100 MG tablet Commonly known as: B-6 Take 100 mg by mouth daily.   traMADol 50 MG tablet Commonly known as: ULTRAM Take 50 mg by mouth every 6 (six) hours as needed for severe pain.   Trulicity 1.5 MG/0.5ML Sopn Generic drug: Dulaglutide Inject 1.5 mg into the skin once a week.   vitamin C 1000 MG tablet Take 1,000 mg by mouth daily.   Vitamin D (Ergocalciferol) 1.25 MG (50000 UNIT) Caps capsule Commonly known as: DRISDOL Take 1 capsule (50,000 Units total) by mouth once a week.         ALLERGIES: No Known Allergies   REVIEW OF SYSTEMS: A comprehensive ROS was conducted with the patient and is negative except as per HPI     OBJECTIVE:   VITAL SIGNS: BP 130/70   Pulse 73   Ht 5\' 10"  (1.778 m)   Wt 204 lb 9.6 oz (92.8 kg)   SpO2 99%   BMI 29.36 kg/m    PHYSICAL EXAM:  General: Pt appears well and is in NAD  Neck: General: Supple without adenopathy or carotid bruits. Thyroid: Thyroid size normal.  No goiter or nodules appreciated.   Lungs: Clear with good BS bilat with no rales, rhonchi, or wheezes  Heart: RRR   Abdomen:  soft,  nontender  Extremities:  Lower extremities - No pretibial edema. No lesions.  Neuro: MS is good with appropriate affect, pt is alert and Ox3    DM foot exam: 09/19/2022   The skin of the feet is intact without sores or ulcerations. The pedal pulses are 2+ on right and 2+ on left. The sensation is absent to a screening 5.07, 10 gram monofilament at the left great toe   DATA REVIEWED:  Lab Results  Component Value Date   HGBA1C 9.3 (A) 12/17/2022   HGBA1C 9.7 (H) 08/30/2022   HGBA1C 9.1 (H) 01/12/2022   Lab Results  Component Value Date   MICROALBUR 4.6 (H) 08/30/2022   LDLCALC 47 08/30/2022   CREATININE 1.42 08/30/2022    Latest Reference Range & Units 08/30/22 10:37  Sodium 135 - 145 mEq/L 140  Potassium 3.5 - 5.1 mEq/L 3.8  Chloride 96 - 112 mEq/L 102  CO2 19 - 32 mEq/L 28  Glucose 70 - 99 mg/dL 960 (H)  BUN 6 - 23 mg/dL 29 (H)  Creatinine 4.54 - 1.50 mg/dL 0.98  Calcium 8.4 - 11.9 mg/dL 9.8  Alkaline Phosphatase 39 - 117 U/L 62  Albumin 3.5 - 5.2 g/dL 4.2  AST 0 - 37 U/L 24  ALT 0 - 53 U/L 24  Total Protein 6.0 - 8.3 g/dL 7.6  Total Bilirubin 0.2 - 1.2 mg/dL 0.5  GFR >14.78 mL/min 53.38 (L)    Latest Reference Range & Units 08/30/22 10:37  Total CHOL/HDL Ratio  4  Cholesterol 0 - 200 mg/dL 295  HDL Cholesterol >62.13 mg/dL 08.65 (L)  LDL (calc) 0 - 99 mg/dL 47  MICROALB/CREAT RATIO 0.0 - 30.0 mg/g 3.8  NonHDL  82.46  Triglycerides 0.0 - 149.0 mg/dL 784.6 (H)  VLDL 0.0 - 96.2 mg/dL 95.2     ASSESSMENT / PLAN / RECOMMENDATIONS:   1) Type 2 Diabetes Mellitus, Poorly controlled, With neuropathic  complications - Most recent A1c of 9.3 %. Goal A1c < 7.0 %.    -Despite increasing Trulicity and glipizide his A1c has not changed much, it decreased from 9.7% to 9.3%  -Patient with dietary indiscretions as well as medication nonadherence, he does not take his insulin on average twice weekly, he also is not consistent with Trulicity intake, patient has been taking  glipizide once daily rather than twice daily, unfortunately, the wall of endocrinology is limited if the patient is not going to take his medication as instructed -All I can do at this point, it is remind the patient of the reason of optimizing glucose control is to prevent blindness, ESRD, and decrease the risk of amputation -His fasting BG 201 mg/DL, will increase basal insulin as below -Encouraged the patient to address underlying stress/depression appropriately    MEDICATIONS: Take glipizide 5 mg, 1 tab twice daily Take Trulicity 1.5 mg once weekly Increase Semglee 12 units daily  EDUCATION / INSTRUCTIONS: BG monitoring instructions: Patient is instructed to check his blood sugars 1 times a day, fasting. Call [facility] if: BG persistently < 70  I reviewed the Rule of 15 for the treatment of hypoglycemia in detail with the patient. Literature supplied.   2) Diabetic complications:  Eye: Does not have known diabetic retinopathy.  Patient encouraged to have an eye exam Neuro/ Feet: Does have known diabetic peripheral neuropathy. Renal: Patient does not have known baseline CKD. He is  on an ACEI/ARB at present.   Follow-up in 6 months   Signed electronically by: Lyndle Herrlich, MD  [facility] Endocrinology  [facility] [address] [place], [place] [address] Phone: [phone] FAX: [phone]   CC: Avanell Shackleton, NP-C [address] [place] [address] Phone: [phone]  Fax: [phone]    Return to Endocrinology clinic as below: Future Appointments  Date Time Provider Department Center  02/21/2023  8:30 AM Jenel Lucks, MD [facility]  06/18/2023  7:50 AM Jadarion Halbig, Brent Bulla  Amedeo Kinsman, MD [facility] None

## 2022-12-17 NOTE — Patient Instructions (Signed)
Take Glipizide 5 mg , 1 tablet before Breakfast and 1 tablet before Supper Trulicity 1.5 mg once weekly  Increase Semglee 12  units daily     HOW TO TREAT LOW BLOOD SUGARS (Blood sugar LESS THAN 70 MG/DL) Please follow the RULE OF 15 for the treatment of hypoglycemia treatment (when your (blood sugars are less than 70 mg/dL)   STEP 1: Take 15 grams of carbohydrates when your blood sugar is low, which includes:  3-4 GLUCOSE TABS  OR 3-4 OZ OF JUICE OR REGULAR SODA OR ONE TUBE OF GLUCOSE GEL    STEP 2: RECHECK blood sugar in 15 MINUTES STEP 3: If your blood sugar is still low at the 15 minute recheck --> then, go back to STEP 1 and treat AGAIN with another 15 grams of carbohydrates.

## 2022-12-24 ENCOUNTER — Other Ambulatory Visit: Payer: Self-pay

## 2023-01-02 ENCOUNTER — Other Ambulatory Visit: Payer: Self-pay

## 2023-01-30 ENCOUNTER — Other Ambulatory Visit: Payer: Self-pay

## 2023-01-31 ENCOUNTER — Other Ambulatory Visit: Payer: Self-pay

## 2023-02-21 ENCOUNTER — Encounter: Payer: BLUE CROSS/BLUE SHIELD | Admitting: Gastroenterology

## 2023-02-21 NOTE — Progress Notes (Signed)
    Please disregard this encounter.  Patient showed up for appointment but then cancelled when he found out we were out of network.  I never saw the patient.

## 2023-03-20 NOTE — Progress Notes (Unsigned)
This encounter was created in error - please disregard.

## 2023-04-29 ENCOUNTER — Telehealth: Payer: Self-pay | Admitting: Family Medicine

## 2023-04-29 NOTE — Telephone Encounter (Signed)
Dr.Tyrrell called states he is reviewing the patients medical records for long term disability and would like to discuss the current state of patient to see how he is doing. Best callback is his cell at [phone], states can call at anytime.

## 2023-04-30 NOTE — Telephone Encounter (Signed)
We havent seen pt since March, is there much we can say?

## 2023-04-30 NOTE — Telephone Encounter (Signed)
LM on vm that we are unable to help at this time as we have not seen pt since March and are unable to speak to him in regards to pt health but if he would like to have pt reach out and get ov notes we will be glad to give them to pt

## 2023-06-03 ENCOUNTER — Other Ambulatory Visit: Payer: Self-pay

## 2023-06-03 ENCOUNTER — Other Ambulatory Visit: Payer: Self-pay | Admitting: Family Medicine

## 2023-06-03 ENCOUNTER — Telehealth: Payer: Self-pay

## 2023-06-03 DIAGNOSIS — I10 Essential (primary) hypertension: Secondary | ICD-10-CM

## 2023-06-03 MED ORDER — LOSARTAN POTASSIUM 100 MG PO TABS
100.0000 mg | ORAL_TABLET | Freq: Every morning | ORAL | 0 refills | Status: DC
Start: 2023-06-03 — End: 2023-09-27
  Filled 2023-06-03: qty 30, 30d supply, fill #0

## 2023-06-03 MED ORDER — AMLODIPINE BESYLATE 10 MG PO TABS
10.0000 mg | ORAL_TABLET | Freq: Every day | ORAL | 0 refills | Status: DC
Start: 2023-06-03 — End: 2023-11-13
  Filled 2023-06-03: qty 30, 30d supply, fill #0

## 2023-06-03 MED ORDER — ATORVASTATIN CALCIUM 10 MG PO TABS
10.0000 mg | ORAL_TABLET | Freq: Every day | ORAL | 0 refills | Status: DC
  Filled 2023-06-03: qty 30, 30d supply, fill #0

## 2023-06-03 MED ORDER — LANTUS SOLOSTAR 100 UNIT/ML ~~LOC~~ SOPN
12.0000 [IU] | PEN_INJECTOR | Freq: Every day | SUBCUTANEOUS | 4 refills | Status: DC
  Filled 2023-06-03: qty 9, 75d supply, fill #0

## 2023-06-03 NOTE — Telephone Encounter (Signed)
Patient new insurance prefers Lantus. Please send in prescription is appropriate.

## 2023-06-03 NOTE — Telephone Encounter (Signed)
Lantus 12 units daily sent to the pharmacy

## 2023-06-11 ENCOUNTER — Other Ambulatory Visit: Payer: Self-pay

## 2023-06-12 ENCOUNTER — Other Ambulatory Visit: Payer: Self-pay

## 2023-06-18 ENCOUNTER — Ambulatory Visit: Payer: 59 | Admitting: Internal Medicine

## 2023-06-18 NOTE — Progress Notes (Deleted)
Name: [name] [name]  MRN/ DOB: [id number], 09/27/1960   Age/ Sex: 62 y.o., male    PCP: Avanell Shackleton, NP-C   Reason for Endocrinology Evaluation: Type 2 Diabetes Mellitus     Date of Initial Endocrinology Visit: 09/19/2022    PATIENT IDENTIFIER: [name] [name] is a 62 y.o. male with a past medical history of DM, Htn and Dyslipidemia . The patient presented for initial endocrinology clinic visit on 09/19/2022 for consultative assistance with his diabetes management.    HPI: [name] [name] was    Diagnosed with DM 2010 Prior Medications tried/Intolerance: has been on insulin since 2022 Hemoglobin A1c has ranged from 9.1% in 2023, peaking at 10.5% in 2014.    Has chronic right arm tingling and should pain   The patient has Trulicity on his medication list, but he tells me he still has Ozempic at home   On his initial visit to our clinic he had an A1c of 9.7%, he was on glipizide, Basaglar, and a GLP-1 agonist.  I have continued Basaglar, increase glipizide and Trulicity  SUBJECTIVE:   During the last visit (12/17/2022): A1c 9.3%  Today (06/18/23): [name] [name] is here for follow-up on diabetes management.  He checks his blood sugars occasionally  times daily. The patient has not had hypoglycemic episodes .  Pt admits to dietary indiscretions with occasional sugar-sweetened beverages   Forgets to take insulin 2 days a week  He forgets to take his trulicity  He takes it once  He has been stressed out lately, as his wife has CVA  Has nausea and vomiting  with Ozempic but no issues with Trulicity   HOME DIABETES REGIMEN: Glipizide 5 mg twice daily Trulicity 1.5 mg weekly Semglee 12 units daily      Statin: yes ACE-I/ARB: Yes    METER DOWNLOAD SUMMARY: did not bring    DIABETIC COMPLICATIONS: Microvascular complications:  Neuropathy  Denies: CKD Last eye exam: Completed > 2 yrs  Macrovascular complications:   Denies: CAD, PVD, CVA   PAST  HISTORY: Past Medical History:  Past Medical History:  Diagnosis Date   Arthritis    Depression    Diabetes mellitus    Hypertension    Past Surgical History:  Past Surgical History:  Procedure Laterality Date   ANKLE SURGERY  1996   L ankle   CLAVICLE SURGERY  2010   JOINT REPLACEMENT     metal plate in lft knee   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 He has never used smokeless tobacco. He reports current alcohol use. He reports that he does not use drugs. Family History:  Family History  Problem Relation Age of Onset   Diabetes Mother    Stroke Mother    Diabetes Sister    Diabetes Sister    Diabetes Sister    Leukemia Maternal Aunt      HOME MEDICATIONS: Allergies as of 06/18/2023   No Known Allergies      Medication List        Accurate as of June 18, 2023  6:50 AM. If you have any questions, ask your nurse or doctor.          amLODipine 10 MG tablet Commonly known as: NORVASC  Take 1 tablet (10 mg total) by mouth daily. NEEDS APPOINTMENT OR FURTHER REFILLS WILL BE DENIED.   aspirin 325 MG tablet Take 1 tablet (325 mg total) by mouth daily.   atorvastatin 10 MG tablet Commonly known as: LIPITOR Take 1 tablet (10 mg total) by mouth daily. NEEDS APPOINTMENT OR FURTHER REFILLS WILL BE DENIED.   carvedilol 12.5 MG tablet Commonly known as: COREG Take 1 tablet (12.5 mg total) by mouth 2 (two) times daily with a meal.   diclofenac 75 MG EC tablet Commonly known as: VOLTAREN Take 75 mg by mouth 2 (two) times daily.   glipiZIDE 5 MG tablet Commonly known as: GLUCOTROL Take 1 tablet (5 mg total) by mouth 2 (two) times daily before a meal.   glucose blood test strip Use to test blood glucose  twice daily: once before eating and again 2 hours after   hydrochlorothiazide 25 MG tablet Commonly known as: HYDRODIURIL Take 1 tablet (25 mg total) by mouth daily.   Lantus SoloStar 100 UNIT/ML Solostar Pen Generic drug: insulin glargine Inject 12 Units into the skin daily.   losartan 100 MG tablet Commonly known as: COZAAR Take 1 tablet (100 mg total) by mouth every morning. NEEDS APPOINTMENT OR FURTHER REFILLS WILL BE DENIED.   methocarbamol 500 MG tablet Commonly known as: ROBAXIN Take 1 tablet (500 mg total) by mouth every 6 (six) hours as needed for muscle spasms.   OneTouch Delica Lancets 30G Misc by Does not apply route.   OneTouch Verio Flex System w/Device Kit by Does not apply route.   oxyCODONE 5 MG immediate release tablet Commonly known as: Oxy IR/ROXICODONE Take 5 mg by mouth every 4 (four) hours as needed for severe pain.   pyridoxine 100 MG tablet Commonly known as: B-6 Take 100 mg by mouth daily.   TechLite Plus Pen Needles 32G X 4 MM Misc Generic drug: Insulin Pen Needle USE AS DIRECTED WITH INSULIN   traMADol 50 MG tablet Commonly known as: ULTRAM Take 50 mg by mouth every 6 (six) hours as needed for severe pain.   Trulicity 1.5 MG/0.5ML Soaj Generic drug: Dulaglutide Inject 1.5 mg into the skin once a week.   vitamin C 1000 MG tablet Take 1,000 mg by mouth daily.   Vitamin D (Ergocalciferol) 1.25 MG (50000 UNIT) Caps capsule Commonly known as: DRISDOL Take 1 capsule (50,000 Units total) by mouth once a week.         ALLERGIES: No Known Allergies   REVIEW OF SYSTEMS: A comprehensive ROS was conducted with the patient and is negative except as per HPI     OBJECTIVE:   VITAL SIGNS: There were no vitals taken for this visit.   PHYSICAL EXAM:  General: Pt appears well and is in NAD  Neck: General: Supple without adenopathy or carotid bruits. Thyroid: Thyroid size normal.  No goiter or nodules appreciated.   Lungs: Clear with  good BS bilat with no rales, rhonchi, or wheezes  Heart: RRR   Abdomen:  soft, nontender  Extremities:  Lower extremities - No pretibial edema. No lesions.  Neuro: MS is good with appropriate affect, pt is alert and Ox3    DM foot exam: 09/19/2022   The skin of the feet is intact without sores or ulcerations. The pedal pulses are 2+ on right and 2+ on left. The sensation is absent to a screening 5.07, 10 gram monofilament at the left great toe   DATA REVIEWED:  Lab Results  Component Value  Date   HGBA1C 9.3 (A) 12/17/2022   HGBA1C 9.7 (H) 08/30/2022   HGBA1C 9.1 (H) 01/12/2022   Lab Results  Component Value Date   MICROALBUR 4.6 (H) 08/30/2022   LDLCALC 47 08/30/2022   CREATININE 1.42 08/30/2022    Latest Reference Range & Units 08/30/22 10:37  Sodium 135 - 145 mEq/L 140  Potassium 3.5 - 5.1 mEq/L 3.8  Chloride 96 - 112 mEq/L 102  CO2 19 - 32 mEq/L 28  Glucose 70 - 99 mg/dL 387 (H)  BUN 6 - 23 mg/dL 29 (H)  Creatinine 5.64 - 1.50 mg/dL 3.32  Calcium 8.4 - 95.1 mg/dL 9.8  Alkaline Phosphatase 39 - 117 U/L 62  Albumin 3.5 - 5.2 g/dL 4.2  AST 0 - 37 U/L 24  ALT 0 - 53 U/L 24  Total Protein 6.0 - 8.3 g/dL 7.6  Total Bilirubin 0.2 - 1.2 mg/dL 0.5  GFR >88.41 mL/min 53.38 (L)    Latest Reference Range & Units 08/30/22 10:37  Total CHOL/HDL Ratio  4  Cholesterol 0 - 200 mg/dL 660  HDL Cholesterol >63.01 mg/dL 60.10 (L)  LDL (calc) 0 - 99 mg/dL 47  MICROALB/CREAT RATIO 0.0 - 30.0 mg/g 3.8  NonHDL  82.46  Triglycerides 0.0 - 149.0 mg/dL 932.3 (H)  VLDL 0.0 - 55.7 mg/dL 32.2     ASSESSMENT / PLAN / RECOMMENDATIONS:   1) Type 2 Diabetes Mellitus, Poorly controlled, With neuropathic  complications - Most recent A1c of 9.3 %. Goal A1c < 7.0 %.    -Despite increasing Trulicity and glipizide his A1c has not changed much, it decreased from 9.7% to 9.3%  -Patient with dietary indiscretions as well as medication nonadherence, he does not take his insulin on average twice  weekly, he also is not consistent with Trulicity intake, patient has been taking glipizide once daily rather than twice daily, unfortunately, the wall of endocrinology is limited if the patient is not going to take his medication as instructed -All I can do at this point, it is remind the patient of the reason of optimizing glucose control is to prevent blindness, ESRD, and decrease the risk of amputation -His fasting BG 201 mg/DL, will increase basal insulin as below -Encouraged the patient to address underlying stress/depression appropriately    MEDICATIONS: Take glipizide 5 mg, 1 tab twice daily Take Trulicity 1.5 mg once weekly Increase Semglee 12 units daily  EDUCATION / INSTRUCTIONS: BG monitoring instructions: Patient is instructed to check his blood sugars 1 times a day, fasting. Call [facility] if: BG persistently < 70  I reviewed the Rule of 15 for the treatment of hypoglycemia in detail with the patient. Literature supplied.   2) Diabetic complications:  Eye: Does not have known diabetic retinopathy.  Patient encouraged to have an eye exam Neuro/ Feet: Does have known diabetic peripheral neuropathy. Renal: Patient does not have known baseline CKD. He is  on an ACEI/ARB at present.   Follow-up in 6 months   Signed electronically by: Lyndle Herrlich, MD  [facility] End[facility]  [facility] [address] [place] [place], [place] [address] Phone: [phone] FAX: [phone]   CC: Avanell Shackleton, NP-C [address] [place] [address] Phone: [phone]  Fax: [phone]    Return to Endocrinology clinic as below: Future Appointments  Date Time Provider Department Center  06/18/2023  7:50 AM Suhayla Chisom, Konrad Dolores, MD [id number] None

## 2023-09-25 ENCOUNTER — Ambulatory Visit (INDEPENDENT_AMBULATORY_CARE_PROVIDER_SITE_OTHER): Payer: Self-pay | Admitting: Family Medicine

## 2023-09-25 ENCOUNTER — Encounter: Payer: Self-pay | Admitting: Family Medicine

## 2023-09-25 ENCOUNTER — Other Ambulatory Visit: Payer: Self-pay

## 2023-09-25 VITALS — BP 138/90 | HR 75 | Temp 97.8°F | Ht 70.0 in | Wt 209.0 lb

## 2023-09-25 DIAGNOSIS — R319 Hematuria, unspecified: Secondary | ICD-10-CM

## 2023-09-25 DIAGNOSIS — E1169 Type 2 diabetes mellitus with other specified complication: Secondary | ICD-10-CM

## 2023-09-25 DIAGNOSIS — Z7984 Long term (current) use of oral hypoglycemic drugs: Secondary | ICD-10-CM

## 2023-09-25 DIAGNOSIS — E785 Hyperlipidemia, unspecified: Secondary | ICD-10-CM

## 2023-09-25 DIAGNOSIS — R1012 Left upper quadrant pain: Secondary | ICD-10-CM

## 2023-09-25 DIAGNOSIS — Z862 Personal history of diseases of the blood and blood-forming organs and certain disorders involving the immune mechanism: Secondary | ICD-10-CM

## 2023-09-25 DIAGNOSIS — R5383 Other fatigue: Secondary | ICD-10-CM

## 2023-09-25 DIAGNOSIS — E1165 Type 2 diabetes mellitus with hyperglycemia: Secondary | ICD-10-CM

## 2023-09-25 DIAGNOSIS — Z91199 Patient's noncompliance with other medical treatment and regimen due to unspecified reason: Secondary | ICD-10-CM

## 2023-09-25 DIAGNOSIS — R809 Proteinuria, unspecified: Secondary | ICD-10-CM

## 2023-09-25 DIAGNOSIS — E559 Vitamin D deficiency, unspecified: Secondary | ICD-10-CM

## 2023-09-25 DIAGNOSIS — I10 Essential (primary) hypertension: Secondary | ICD-10-CM

## 2023-09-25 DIAGNOSIS — D709 Neutropenia, unspecified: Secondary | ICD-10-CM

## 2023-09-25 LAB — CBC WITH DIFFERENTIAL/PLATELET
Basophils Absolute: 0 10*3/uL (ref 0.0–0.1)
Basophils Relative: 0.6 % (ref 0.0–3.0)
Eosinophils Absolute: 0.1 10*3/uL (ref 0.0–0.7)
Eosinophils Relative: 4.1 % (ref 0.0–5.0)
HCT: 39.8 % (ref 39.0–52.0)
Hemoglobin: 13.1 g/dL (ref 13.0–17.0)
Lymphocytes Relative: 37.9 % (ref 12.0–46.0)
Lymphs Abs: 1.2 10*3/uL (ref 0.7–4.0)
MCHC: 32.9 g/dL (ref 30.0–36.0)
MCV: 90.2 fl (ref 78.0–100.0)
Monocytes Absolute: 0.6 10*3/uL (ref 0.1–1.0)
Monocytes Relative: 19 % — ABNORMAL HIGH (ref 3.0–12.0)
Neutro Abs: 1.2 10*3/uL — ABNORMAL LOW (ref 1.4–7.7)
Neutrophils Relative %: 38.4 % — ABNORMAL LOW (ref 43.0–77.0)
Platelets: 145 10*3/uL — ABNORMAL LOW (ref 150.0–400.0)
RBC: 4.41 Mil/uL (ref 4.22–5.81)
RDW: 12.6 % (ref 11.5–15.5)
WBC: 3.1 10*3/uL — ABNORMAL LOW (ref 4.0–10.5)

## 2023-09-25 LAB — URINALYSIS, MICROSCOPIC ONLY

## 2023-09-25 LAB — BASIC METABOLIC PANEL WITH GFR
BUN: 16 mg/dL (ref 6–23)
CO2: 28 meq/L (ref 19–32)
Calcium: 9.4 mg/dL (ref 8.4–10.5)
Chloride: 100 meq/L (ref 96–112)
Creatinine, Ser: 1.19 mg/dL (ref 0.40–1.50)
GFR: 65.5 mL/min (ref >60.00)
Glucose, Bld: 347 mg/dL — ABNORMAL HIGH (ref 70–99)
Potassium: 4.2 meq/L (ref 3.5–5.1)
Sodium: 136 meq/L (ref 135–145)

## 2023-09-25 LAB — POCT GLYCOSYLATED HEMOGLOBIN (HGB A1C): Hemoglobin A1C: 13 % — AB (ref 4.0–5.6)

## 2023-09-25 LAB — POC URINALSYSI DIPSTICK (AUTOMATED)
Bilirubin, UA: NEGATIVE
Glucose, UA: POSITIVE — AB
Ketones, UA: NEGATIVE
Leukocytes, UA: NEGATIVE
Nitrite, UA: NEGATIVE
Protein, UA: POSITIVE — AB
Spec Grav, UA: 1.02 (ref 1.010–1.025)
Urobilinogen, UA: 0.2 U/dL
pH, UA: 6 (ref 5.0–8.0)

## 2023-09-25 LAB — LIPID PANEL
Cholesterol: 136 mg/dL (ref 0–200)
HDL: 33.2 mg/dL — ABNORMAL LOW (ref >39.00)
LDL Cholesterol: 74 mg/dL (ref 0–99)
NonHDL: 103.1
Total CHOL/HDL Ratio: 4
Triglycerides: 144 mg/dL (ref 0.0–149.0)
VLDL: 28.8 mg/dL (ref 0.0–40.0)

## 2023-09-25 LAB — HEPATIC FUNCTION PANEL
ALT: 15 U/L (ref 0–53)
AST: 16 U/L (ref 0–37)
Albumin: 4.1 g/dL (ref 3.5–5.2)
Alkaline Phosphatase: 86 U/L (ref 39–117)
Bilirubin, Direct: 0.2 mg/dL (ref 0.0–0.3)
Total Bilirubin: 0.8 mg/dL (ref 0.2–1.2)
Total Protein: 7.6 g/dL (ref 6.0–8.3)

## 2023-09-25 LAB — MICROALBUMIN / CREATININE URINE RATIO
Creatinine,U: 99 mg/dL
Microalb Creat Ratio: 230.4 mg/g — ABNORMAL HIGH (ref 0.0–30.0)
Microalb, Ur: 22.8 mg/dL — ABNORMAL HIGH (ref 0.0–1.9)

## 2023-09-25 LAB — LIPASE: Lipase: 25 U/L (ref 11.0–59.0)

## 2023-09-25 MED ORDER — LANCET DEVICE MISC
0 refills | Status: AC
  Filled 2023-09-25: qty 1, fill #0
  Filled 2024-02-12 (×3): qty 1, 30d supply, fill #0

## 2023-09-25 MED ORDER — BLOOD GLUCOSE TEST VI STRP
ORAL_STRIP | 5 refills | Status: DC
  Filled 2023-09-25 – 2023-10-01 (×2): qty 100, 30d supply, fill #0

## 2023-09-25 MED ORDER — ACCU-CHEK SOFTCLIX LANCETS MISC
5 refills | Status: DC
  Filled 2023-09-25: qty 100, 33d supply, fill #0
  Filled 2023-10-01: qty 100, 30d supply, fill #0

## 2023-09-25 MED ORDER — BLOOD GLUCOSE MONITOR SYSTEM W/DEVICE KIT
PACK | 0 refills | Status: DC
Start: 2023-09-25 — End: 2023-12-23
  Filled 2023-09-25 – 2023-10-01 (×2): qty 1, 30d supply, fill #0

## 2023-09-25 NOTE — Progress Notes (Unsigned)
 Subjective:     Patient ID: [name] [name], male    DOB: 02-12-1961, 63 y.o.   MRN: [id number]  Chief Complaint  Patient presents with   Medication Management    Hasn't had any prescriptions in last 6 months due to insurance     HPI  History of Present Illness         He is here with his wife. I have not seen him 08/2022.   States he has been out of his medications for at least 6 months.   Established with endocrinology in June 2024. He has not been back due to insurance issues. Last A1c 9.3% at that time he was taking Basaglar, Glipizide 5 mg bid meals, and Ozempic.  States Ozempic caused severe GI symptoms.    C/o fatigue, dry mouth, thirsty and LUQ abdominal pain x 4 wks.  Pain with movement and laying on his left side. Denies injury.   Hx of anemia. Last colonoscopy in 2017 at [facility].  I referred him to GI in 08/2022 and he did not schedule.   He has not been taking his statin.    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   Procedure: RIGHT TOTAL HIP ARTHROPLASTY ANTERIOR APPROACH;  Surgeon: Kathryne Hitch, MD;  Location: [facility];  Service: Orthopedics;  Laterality: Right;   UMBILICAL HERNIA REPAIR  2009    Family History  Problem Relation Age of Onset   Diabetes Mother    Stroke Mother    Diabetes Sister    Diabetes Sister    Diabetes Sister    Leukemia Maternal Aunt     Social History   Socioeconomic History   Marital status: Married    Spouse name: Not on file   Number of children: Not  on file   Years of education: Not on file   Highest education level: Not on file  Occupational History   Not on file  Tobacco Use   Smoking status: Some Days    Types: Cigars   Smokeless tobacco: Never   Tobacco comments:    a cigar occasionally  Substance and Sexual Activity   Alcohol use: Yes    Comment: beer - occasionally   Drug use: No   Sexual activity: Not on file  Other Topics Concern   Not on file  Social History Narrative   Not on file   Social Drivers of Health   Financial Resource Strain: High Risk (08/03/2021)   Received from [facility], [facility]   Overall Financial Resource Strain (CARDIA)    Difficulty of Paying Living Expenses: Hard  Food Insecurity: Food Insecurity Present (08/03/2021)   Received from [facility], [facility]   Hunger Vital Sign    Worried About Running Out of Food in the Last Year: Sometimes true    Ran Out of Food in the Last Year: Sometimes true  Transportation Needs: No Transportation Needs (08/03/2021)   Received from [facility], [facility]   [facility] - Transportation    Lack of Transportation (Medical): No    Lack of Transportation (Non-Medical): No  Recent Concern: Transportation Needs - Unmet Transportation Needs (06/12/2021)   Received from [facility] - Transportation    Lack of Transportation (Medical): No    Lack of Transportation (Non-Medical): Yes  Physical Activity: Insufficiently Active (08/03/2021)   Received from [facility], [facility]   Exercise Vital Sign    Days of Exercise per Week: 5 days    Minutes of Exercise per Session: 20 min  Stress: No Stress Concern Present (08/17/2021)   Received from [facility], [facility] of Occupational [facility] - Occupational Stress Questionnaire    Feeling of Stress : Not at all  Recent Concern: Stress - Stress Concern Present (06/12/2021)   Received from [facility] of Occupational Health - Occupational  Stress Questionnaire    Feeling of Stress : Very much  Social Connections: Unknown (10/31/2021)   Received from [facility], [facility]   Social Network    Social Network: Not on file  Intimate Partner Violence: Unknown (10/02/2021)   Received from [facility], [facility]   HITS    Physically Hurt: Not on file    Insult or Talk Down To: Not on file    Threaten Physical Harm: Not on file    Scream or Curse: Not on file    Outpatient Medications Prior to Visit  Medication Sig Dispense Refill   Ascorbic Acid (VITAMIN C) 1000 MG tablet Take 1,000 mg by mouth daily.     aspirin 325 MG tablet Take 1 tablet (325 mg total) by mouth daily. 90 tablet 3   pyridoxine (B-6) 100 MG tablet Take 100 mg by mouth daily.     amLODipine (NORVASC) 10 MG tablet Take 1 tablet (10 mg total) by mouth daily. NEEDS APPOINTMENT OR FURTHER REFILLS WILL BE DENIED. (Patient not taking: Reported on 09/25/2023) 30 tablet 0   Blood Glucose Monitoring Suppl (ONETOUCH VERIO FLEX SYSTEM) w/Device KIT by Does not apply route. (Patient not taking: Reported on 09/25/2023)     diclofenac (VOLTAREN) 75 MG EC tablet Take 75 mg by mouth 2 (two) times daily. (Patient not taking: Reported on 09/25/2023)     Dulaglutide (TRULICITY) 1.5 MG/0.5ML SOAJ Inject 1.5 mg into the skin once a week. (Patient not taking: Reported on 09/25/2023) 6 mL 3   glucose blood test strip Use to test blood glucose twice daily: once before eating and again 2 hours after (Patient not taking: Reported on 09/25/2023) 100 each 12   Insulin Pen Needle (PEN NEEDLES) 32G X 4 MM MISC USE AS DIRECTED WITH INSULIN (Patient not taking: Reported on 09/25/2023) 100 each 2   methocarbamol (ROBAXIN) 500 MG tablet Take 1 tablet (500 mg total) by mouth every 6 (six) hours as needed for muscle spasms. (Patient not taking: Reported on 09/25/2023) 40 tablet 1   OneTouch Delica Lancets 30G MISC by Does not apply route. (Patient not taking: Reported on 09/25/2023)     oxyCODONE  (OXY IR/ROXICODONE) 5 MG immediate release tablet Take 5 mg by mouth every 4 (four) hours as needed for severe pain. (Patient not taking: Reported on 09/25/2023)     traMADol (ULTRAM) 50 MG tablet Take 50 mg by mouth every 6 (six) hours as needed for severe pain. (Patient not taking: Reported  on 09/25/2023)     atorvastatin (LIPITOR) 10 MG tablet Take 1 tablet (10 mg total) by mouth daily. NEEDS APPOINTMENT OR FURTHER REFILLS WILL BE DENIED. (Patient not taking: Reported on 09/25/2023) 30 tablet 0   carvedilol (COREG) 12.5 MG tablet Take 1 tablet (12.5 mg total) by mouth 2 (two) times daily with a meal. (Patient not taking: Reported on 09/25/2023) 180 tablet 3   ergocalciferol (VITAMIN D2) 1.25 MG (50000 UT) capsule Take 1 capsule (50,000 Units total) by mouth once a week. (Patient not taking: Reported on 09/25/2023) 8 capsule 0   glipiZIDE (GLUCOTROL) 5 MG tablet Take 1 tablet (5 mg total) by mouth 2 (two) times daily before a meal. (Patient not taking: Reported on 09/25/2023) 180 tablet 2   hydrochlorothiazide (HYDRODIURIL) 25 MG tablet Take 1 tablet (25 mg total) by mouth daily. (Patient not taking: Reported on 09/25/2023) 90 tablet 0   insulin glargine (LANTUS SOLOSTAR) 100 UNIT/ML Solostar Pen Inject 12 Units into the skin daily. (Patient not taking: Reported on 09/25/2023) 15 mL 4   losartan (COZAAR) 100 MG tablet Take 1 tablet (100 mg total) by mouth every morning. NEEDS APPOINTMENT OR FURTHER REFILLS WILL BE DENIED. (Patient not taking: Reported on 09/25/2023) 30 tablet 0   No facility-administered medications prior to visit.    No Known Allergies  Review of Systems  Constitutional:  Negative for chills, fever, malaise/fatigue and weight loss.  Eyes:  Positive for blurred vision. Negative for double vision and photophobia.  Respiratory:  Negative for cough and shortness of breath.   Cardiovascular:  Negative for chest pain, palpitations and leg swelling.  Gastrointestinal:  Positive for abdominal  pain. Negative for blood in stool, constipation, diarrhea, nausea and vomiting.  Genitourinary:  Positive for frequency. Negative for dysuria, flank pain, hematuria and urgency.  Neurological:  Negative for dizziness, focal weakness and headaches.  Psychiatric/Behavioral:  Negative for depression. The patient is not nervous/anxious.        Objective:    Physical Exam Constitutional:      General: He is not in acute distress.    Appearance: He is not ill-appearing.  HENT:     Mouth/Throat:     Mouth: Mucous membranes are moist.  Eyes:     Extraocular Movements: Extraocular movements intact.     Conjunctiva/sclera: Conjunctivae normal.  Cardiovascular:     Rate and Rhythm: Normal rate and regular rhythm.  Pulmonary:     Effort: Pulmonary effort is normal.     Breath sounds: Normal breath sounds.  Abdominal:     General: Bowel sounds are normal.     Palpations: Abdomen is soft.     Tenderness: There is abdominal tenderness in the left upper quadrant. There is no right CVA tenderness, left CVA tenderness, guarding or rebound. Negative signs include Murphy's sign and McBurney's sign.  Musculoskeletal:     Cervical back: Normal range of motion and neck supple.     Right lower leg: No edema.     Left lower leg: No edema.  Skin:    General: Skin is warm and dry.  Neurological:     General: No focal deficit present.     Mental Status: He is alert and oriented to person, place, and time.     Motor: No weakness.     Coordination: Coordination normal.     Gait: Gait normal.  Psychiatric:        Mood and Affect: Mood normal.        Behavior:  Behavior normal.        Thought Content: Thought content normal.      BP (!) 138/90 (BP Location: Left Arm, Patient Position: Sitting)   Pulse 75   Temp 97.8 F (36.6 C) (Temporal)   Ht 5\' 10"  (1.778 m)   Wt 209 lb (94.8 kg)   SpO2 100%   BMI 29.99 kg/m  Wt Readings from Last 3 Encounters:  09/25/23 209 lb (94.8 kg)  12/17/22 204 lb  9.6 oz (92.8 kg)  09/19/22 207 lb (93.9 kg)       Assessment & Plan:   Problem List Items Addressed This Visit     Hyperlipidemia associated with type 2 diabetes mellitus (HCC) - Primary   Relevant Medications   atorvastatin (LIPITOR) 10 MG tablet   glipiZIDE (GLUCOTROL) 5 MG tablet   hydrochlorothiazide (HYDRODIURIL) 25 MG tablet   losartan (COZAAR) 100 MG tablet   insulin glargine (LANTUS SOLOSTAR) 100 UNIT/ML Solostar Pen   carvedilol (COREG) 12.5 MG tablet   Other Relevant Orders   Basic metabolic panel (Completed)   Hepatic function panel (Completed)   Lipid panel (Completed)   Neutropenia (HCC)   Personal history of noncompliance with medical treatment, presenting hazards to health   Primary hypertension   Relevant Medications   atorvastatin (LIPITOR) 10 MG tablet   hydrochlorothiazide (HYDRODIURIL) 25 MG tablet   losartan (COZAAR) 100 MG tablet   carvedilol (COREG) 12.5 MG tablet   Other Relevant Orders   CBC with Differential/Platelet (Completed)   Basic metabolic panel (Completed)   Ferritin (Completed)   POCT Urinalysis Dipstick (Automated) (Completed)   Uncontrolled type 2 diabetes mellitus with hyperglycemia (HCC)   Relevant Medications   Blood Glucose Monitoring Suppl (BLOOD GLUCOSE MONITOR SYSTEM) w/Device KIT   Glucose Blood (BLOOD GLUCOSE TEST STRIPS) STRP   Lancet Device MISC   TRUEplus Lancets 28G MISC   atorvastatin (LIPITOR) 10 MG tablet   glipiZIDE (GLUCOTROL) 5 MG tablet   losartan (COZAAR) 100 MG tablet   insulin glargine (LANTUS SOLOSTAR) 100 UNIT/ML Solostar Pen   Other Relevant Orders   CBC with Differential/Platelet (Completed)   Basic metabolic panel (Completed)   Microalbumin / creatinine urine ratio (Completed)   POCT Urinalysis Dipstick (Automated) (Completed)   POCT glycosylated hemoglobin (Hb A1C) (Completed)   Urine test positive for microalbuminuria   Vitamin D deficiency   Relevant Medications   ergocalciferol (VITAMIN D2) 1.25  MG (50000 UT) capsule   Other Visit Diagnoses       LUQ abdominal pain       Relevant Orders   CBC with Differential/Platelet (Completed)   Basic metabolic panel (Completed)   Hepatic function panel (Completed)   Lipase (Completed)   POCT Urinalysis Dipstick (Automated) (Completed)   CT ABDOMEN PELVIS W CONTRAST     Fatigue, unspecified type       Relevant Orders   CBC with Differential/Platelet (Completed)   Basic metabolic panel (Completed)   Ferritin (Completed)   Folate (Completed)   Hepatic function panel (Completed)   Vitamin B12 (Completed)   TSH (Completed)   T4, free (Completed)   VITAMIN D 25 Hydroxy (Vit-D Deficiency, Fractures) (Completed)   POCT Urinalysis Dipstick (Automated) (Completed)     History of anemia       Relevant Orders   CBC with Differential/Platelet (Completed)   Ferritin (Completed)   Folate (Completed)     Hematuria, unspecified type       Relevant Orders   Urine Microscopic Only (Completed)  A1c 13.0 UA shows glucose 3+, pro 1+, blood +, no ketones  Urine microscopic ordered and negative for RBCs  Urine microalbumin abnormal and will need to be rechecked once his blood sugars and blood pressure are better controlled.  I have not seen him in over a year.  He has not followed up with his endocrinologist.  Reports been out of medication for at least 6 months.  He was having issues with insurance coverage.  He would like to start back on medications and start treating his chronic health conditions.  His wife is with him. Once I have his renal function, I we will restart medications for hypertension and diabetes.  I will also restart statin therapy.  Checking fasting lipids today. I will also order CT abdomen with contrast due to left upper quadrant pain and tenderness once I have his renal function. Counseling on a low sugar, low carbohydrate diet and recommend that he start checking his blood sugars at home. He will follow-up with me in 4  weeks or sooner if needed.  He will also call and schedule with his endocrinologist.  Restart medications for hypertension, diabetes and statin therapy. CT abdomen with contrast ordered.     I have changed [name] [name]'s atorvastatin and losartan. I am also having him start on Blood Glucose Monitor System, BLOOD GLUCOSE TEST STRIPS, Lancet Device, and TRUEplus Lancets 28G. Additionally, I am having him maintain his methocarbamol, traMADol, OneTouch Verio Flex System, OneTouch Delica Lancets 30G, diclofenac, vitamin C, pyridoxine, oxyCODONE, Pen Needles, Trulicity, aspirin, glucose blood, amLODipine, glipiZIDE, hydrochlorothiazide, Lantus SoloStar, carvedilol, and ergocalciferol.  Meds ordered this encounter  Medications   Blood Glucose Monitoring Suppl (BLOOD GLUCOSE MONITOR SYSTEM) w/Device KIT    Sig: May substitute to any manufacturer covered by patient's insurance.    Dispense:  1 kit    Refill:  0   Glucose Blood (BLOOD GLUCOSE TEST STRIPS) STRP    Sig: Use to check blood sugars 3 times daily.    Dispense:  200 strip    Refill:  5   Lancet Device MISC    Sig: May substitute to any manufacturer covered by patient's insurance.    Dispense:  1 each    Refill:  0   TRUEplus Lancets 28G MISC    Sig: May substitute to any manufacturer covered by patient's insurance. Use to check blood sugars 3 times daily.    Dispense:  200 each    Refill:  5   atorvastatin (LIPITOR) 10 MG tablet    Sig: Take 1 tablet (10 mg total) by mouth daily.    Dispense:  90 tablet    Refill:  0    Supervising Provider:   Hillard Danker A [[id number]]   glipiZIDE (GLUCOTROL) 5 MG tablet    Sig: Take 1 tablet (5 mg total) by mouth 2 (two) times daily before a meal.    Dispense:  180 tablet    Refill:  0    Supervising Provider:   Hillard Danker A [[id number]]   hydrochlorothiazide (HYDRODIURIL) 25 MG tablet    Sig: Take 1 tablet (25 mg total) by mouth daily.    Dispense:  90 tablet    Refill:  0     Supervising Provider:   Hillard Danker A [[id number]]   losartan (COZAAR) 100 MG tablet    Sig: Take 1 tablet (100 mg total) by mouth every morning.    Dispense:  90 tablet    Refill:  0  Supervising Provider:   Hillard Danker A [[id number]]   insulin glargine (LANTUS SOLOSTAR) 100 UNIT/ML Solostar Pen    Sig: Inject 12 Units into the skin daily.    Dispense:  15 mL    Refill:  0    Supervising Provider:   Hillard Danker A [[id number]]   carvedilol (COREG) 12.5 MG tablet    Sig: Take 1 tablet (12.5 mg total) by mouth 2 (two) times daily with a meal.    Dispense:  180 tablet    Refill:  1    Supervising Provider:   Hillard Danker A [[id number]]   ergocalciferol (VITAMIN D2) 1.25 MG (50000 UT) capsule    Sig: Take 1 capsule (50,000 Units total) by mouth once a week.    Dispense:  6 capsule    Refill:  0    Supervising Provider:   Hillard Danker A [[id number]]

## 2023-09-25 NOTE — Patient Instructions (Addendum)
 Please go downstairs for labs.   Your hemoglobin A1c is 13.0%.  Your blood sugars are very high.  This is why you are having dry mouth, feeling thirsty and having urinary frequency.  I will send in medications once I have your kidney function.   Start checking your blood sugars 2 or 3 times daily.  First thing in the morning when you get up and then 2 hours after lunch and 2 hours after your last meal of the day.  Keep a record of these and bring them to your follow-up visit.  Cut back on sugar and carbohydrates (potatoes, bread, pasta and rice)  I plan to order a CT of your abdomen for the abdominal pain once I have your kidney function also.   Please call and schedule with Dr. Lonzo Cloud at [facility]-  phone [phone]

## 2023-09-26 LAB — TSH: TSH: 1.93 u[IU]/mL (ref 0.35–5.50)

## 2023-09-27 ENCOUNTER — Other Ambulatory Visit: Payer: Self-pay

## 2023-09-27 ENCOUNTER — Encounter: Payer: Self-pay | Admitting: Family Medicine

## 2023-09-27 DIAGNOSIS — R809 Proteinuria, unspecified: Secondary | ICD-10-CM | POA: Insufficient documentation

## 2023-09-27 DIAGNOSIS — Z91199 Patient's noncompliance with other medical treatment and regimen due to unspecified reason: Secondary | ICD-10-CM | POA: Insufficient documentation

## 2023-09-27 DIAGNOSIS — D709 Neutropenia, unspecified: Secondary | ICD-10-CM | POA: Insufficient documentation

## 2023-09-27 LAB — FERRITIN: Ferritin: 240.9 ng/mL (ref 22.0–322.0)

## 2023-09-27 LAB — VITAMIN B12: Vitamin B-12: 234 pg/mL (ref 211–911)

## 2023-09-27 LAB — T4, FREE: Free T4: 0.93 ng/dL (ref 0.60–1.60)

## 2023-09-27 LAB — VITAMIN D 25 HYDROXY (VIT D DEFICIENCY, FRACTURES): VITD: 25.17 ng/mL — ABNORMAL LOW (ref 30.00–100.00)

## 2023-09-27 LAB — FOLATE: Folate: 22.9 ng/mL (ref >5.9)

## 2023-09-27 MED ORDER — GLIPIZIDE 5 MG PO TABS
5.0000 mg | ORAL_TABLET | Freq: Two times a day (BID) | ORAL | 0 refills | Status: DC
  Filled 2023-09-27: qty 180, 90d supply, fill #0
  Filled 2023-10-01: qty 60, 30d supply, fill #0

## 2023-09-27 MED ORDER — ERGOCALCIFEROL 1.25 MG (50000 UT) PO CAPS
50000.0000 [IU] | ORAL_CAPSULE | ORAL | 0 refills | Status: AC
  Filled 2023-09-27: qty 6, 42d supply, fill #0

## 2023-09-27 MED ORDER — LOSARTAN POTASSIUM 100 MG PO TABS
100.0000 mg | ORAL_TABLET | Freq: Every morning | ORAL | 0 refills | Status: DC
  Filled 2023-09-27: qty 90, 90d supply, fill #0
  Filled 2023-10-01: qty 30, 30d supply, fill #0
  Filled 2023-11-12: qty 30, 30d supply, fill #1

## 2023-09-27 MED ORDER — CARVEDILOL 12.5 MG PO TABS
12.5000 mg | ORAL_TABLET | Freq: Two times a day (BID) | ORAL | 1 refills | Status: DC
  Filled 2023-09-27 – 2023-10-01 (×2): qty 60, 30d supply, fill #0
  Filled 2023-11-12: qty 60, 30d supply, fill #1

## 2023-09-27 MED ORDER — LANTUS SOLOSTAR 100 UNIT/ML ~~LOC~~ SOPN
12.0000 [IU] | PEN_INJECTOR | Freq: Every day | SUBCUTANEOUS | 0 refills | Status: DC
  Filled 2023-09-27: qty 9, 75d supply, fill #0
  Filled 2023-10-01: qty 3, 25d supply, fill #0

## 2023-09-27 MED ORDER — ATORVASTATIN CALCIUM 10 MG PO TABS
10.0000 mg | ORAL_TABLET | Freq: Every day | ORAL | 0 refills | Status: DC
  Filled 2023-09-27: qty 90, 90d supply, fill #0

## 2023-09-27 MED ORDER — HYDROCHLOROTHIAZIDE 25 MG PO TABS
25.0000 mg | ORAL_TABLET | Freq: Every day | ORAL | 0 refills | Status: DC
  Filled 2023-09-27: qty 90, 90d supply, fill #0
  Filled 2023-10-01: qty 30, 30d supply, fill #0
  Filled 2023-11-12: qty 30, 30d supply, fill #1

## 2023-09-27 NOTE — Progress Notes (Signed)
 Please let him know that I finally received his lab results and I sent medication refills to his pharmacy.  For now, I am restarting his losartan and HCTZ for blood pressure.  I am restarting his long-acting insulin and glipizide for his diabetes.  I also restarted his cholesterol medication.  Did he schedule appointment with his endocrinologist? His urine test shows that his diabetes and high blood pressure are affecting his kidney function and he is spilling protein.  We will recheck his urine protein again in 3 months once his blood sugars and blood pressure are better controlled. His vitamin D is low and I we will send in a high-dose prescription vitamin D supplement to take once weekly for the next 6 weeks and then he should be taking an over-the-counter multivitamin daily as well.

## 2023-09-27 NOTE — Progress Notes (Signed)
 Also, please let him know that I ordered a CT of his abdomen due to the left upper quadrant pain he has been having.  He he should get a call to get this scheduled if the pain gets more severe then he should go to the emergency department.

## 2023-10-01 ENCOUNTER — Other Ambulatory Visit: Payer: Self-pay

## 2023-10-08 ENCOUNTER — Telehealth: Payer: Self-pay

## 2023-10-08 NOTE — Telephone Encounter (Signed)
 Contact patient to schedule appointment

## 2023-10-14 ENCOUNTER — Other Ambulatory Visit: Payer: Self-pay

## 2023-10-14 ENCOUNTER — Ambulatory Visit (INDEPENDENT_AMBULATORY_CARE_PROVIDER_SITE_OTHER): Admitting: Internal Medicine

## 2023-10-14 ENCOUNTER — Encounter: Payer: Self-pay | Admitting: Internal Medicine

## 2023-10-14 VITALS — BP 136/70 | HR 76 | Resp 20 | Ht 70.0 in | Wt 211.8 lb

## 2023-10-14 DIAGNOSIS — E1165 Type 2 diabetes mellitus with hyperglycemia: Secondary | ICD-10-CM | POA: Diagnosis not present

## 2023-10-14 DIAGNOSIS — Z794 Long term (current) use of insulin: Secondary | ICD-10-CM

## 2023-10-14 DIAGNOSIS — E1142 Type 2 diabetes mellitus with diabetic polyneuropathy: Secondary | ICD-10-CM

## 2023-10-14 LAB — GLUCOSE, POCT (MANUAL RESULT ENTRY): POC Glucose: 341 mg/dL — AB (ref 70–99)

## 2023-10-14 MED ORDER — PEN NEEDLES 32G X 4 MM MISC
1.0000 | Freq: Four times a day (QID) | 2 refills | Status: DC
Start: 2023-10-14 — End: 2024-04-27
  Filled 2023-10-14: qty 100, 25d supply, fill #0
  Filled 2023-11-12: qty 100, 25d supply, fill #1
  Filled 2024-02-12 (×2): qty 100, 25d supply, fill #2

## 2023-10-14 MED ORDER — LANTUS SOLOSTAR 100 UNIT/ML ~~LOC~~ SOPN
20.0000 [IU] | PEN_INJECTOR | Freq: Every day | SUBCUTANEOUS | 4 refills | Status: DC
  Filled 2023-10-14: qty 15, 75d supply, fill #0
  Filled 2023-11-12: qty 6, 30d supply, fill #0

## 2023-10-14 MED ORDER — INSULIN LISPRO (1 UNIT DIAL) 100 UNIT/ML (KWIKPEN)
6.0000 [IU] | PEN_INJECTOR | Freq: Three times a day (TID) | SUBCUTANEOUS | 6 refills | Status: DC
  Filled 2023-10-14: qty 15, 84d supply, fill #0

## 2023-10-14 NOTE — Progress Notes (Signed)
 Name: [name] [name]  MRN/ DOB: [id number], [birth year]   Age/ Sex: 63 y.o., male    PCP: Avanell Shackleton, NP-C   Reason for Endocrinology Evaluation: Type 2 Diabetes Mellitus     Date of Initial Endocrinology Visit: 09/19/2022    PATIENT IDENTIFIER: [name] [name] is a 63 y.o. male with a past medical history of DM, Htn and Dyslipidemia . The patient presented for initial endocrinology clinic visit on 09/19/2022 for consultative assistance with his diabetes management.    HPI: [name] [name] was    Diagnosed with DM 2010 Prior Medications tried/Intolerance: has been on insulin since 2022 Hemoglobin A1c has ranged from 9.1% in 2023, peaking at 10.5% in 2014.    Has chronic right arm tingling and should pain   The patient has Trulicity on his medication list, but he tells me he still has Ozempic at home   On his initial visit to our clinic he had an A1c of 9.7%, he was on glipizide, Basaglar, and a GLP-1 agonist.  I have continued Basaglar, increase glipizide and Trulicity    SUBJECTIVE:   During the last visit (12/17/2022): A1c 9.3%      Today (10/14/23): [name] [name] is here for follow-up on diabetes management.  He checks his blood sugars occasionally . The patient has not had hypoglycemic episodes .  He was out of his medication for ~ 8 months due to cost issues.  He was restarted 2 weeks  He eats 1-2 meals a day  He is on Family plan Medicaid ?   Denies nausea or vomiting  No changes in bowel movements  Has nausea and vomiting  with Ozempic but no issues with Trulicity   HOME DIABETES REGIMEN: Glipizide 5 mg twice daily Trulicity 1.5 mg weekly Semglee 12 units daily      Statin: yes ACE-I/ARB: Yes    METER DOWNLOAD SUMMARY: did not bring    DIABETIC COMPLICATIONS: Microvascular complications:  Neuropathy  Denies: CKD Last eye exam: Completed > 2 yrs  Macrovascular complications:   Denies: CAD, PVD, CVA   PAST HISTORY: Past Medical  History:  Past Medical History:  Diagnosis Date   Arthritis    Depression    Diabetes mellitus    Hypertension    Past Surgical History:  Past Surgical History:  Procedure Laterality Date   ANKLE SURGERY  1996   L ankle   CLAVICLE SURGERY  2010   JOINT REPLACEMENT     metal plate in lft knee   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 He has never used smokeless tobacco. He reports current alcohol use. He reports that he does not use drugs. Family History:  Family History  Problem Relation Age of Onset   Diabetes Mother    Stroke Mother    Diabetes Sister    Diabetes Sister    Diabetes Sister    Leukemia Maternal Aunt      HOME MEDICATIONS: Allergies as of 10/14/2023   No Known Allergies      Medication List        Accurate as of October 14, 2023  9:59 AM. If you have any questions, ask your nurse or doctor.  amLODipine 10 MG tablet Commonly known as: NORVASC Take 1 tablet (10 mg total) by mouth daily. NEEDS APPOINTMENT OR FURTHER REFILLS WILL BE DENIED.   aspirin 325 MG tablet Take 1 tablet (325 mg total) by mouth daily.   atorvastatin 10 MG tablet Commonly known as: LIPITOR Take 1 tablet (10 mg total) by mouth daily.   carvedilol 12.5 MG tablet Commonly known as: COREG Take 1 tablet (12.5 mg total) by mouth 2 (two) times daily with a meal.   diclofenac 75 MG EC tablet Commonly known as: VOLTAREN Take 75 mg by mouth 2 (two) times daily.   glipiZIDE 5 MG tablet Commonly known as: GLUCOTROL Take 1 tablet (5 mg total) by mouth 2 (two) times daily before a meal.   glucose blood test strip Use to test blood glucose twice daily: once before eating and again 2 hours after   Accu-Chek Guide Test  test strip Generic drug: glucose blood Use to check blood sugars 3 times daily.   hydrochlorothiazide 25 MG tablet Commonly known as: HYDRODIURIL Take 1 tablet (25 mg total) by mouth daily.   Lancet Device Misc May substitute to any manufacturer covered by patient's insurance.   Lantus SoloStar 100 UNIT/ML Solostar Pen Generic drug: insulin glargine Inject 12 Units into the skin daily.   losartan 100 MG tablet Commonly known as: COZAAR Take 1 tablet (100 mg total) by mouth every morning.   methocarbamol 500 MG tablet Commonly known as: ROBAXIN Take 1 tablet (500 mg total) by mouth every 6 (six) hours as needed for muscle spasms.   OneTouch Delica Lancets 30G Misc by Does not apply route.   Accu-Chek Softclix Lancets lancets Use to check blood sugars 3 times daily.   OneTouch Verio Flex System w/Device Kit by Does not apply route.   Accu-Chek Guide Me w/Device Kit May substitute to any manufacturer covered by patient's insurance.   oxyCODONE 5 MG immediate release tablet Commonly known as: Oxy IR/ROXICODONE Take 5 mg by mouth every 4 (four) hours as needed for severe pain (pain score 7-10).   pyridoxine 100 MG tablet Commonly known as: B-6 Take 100 mg by mouth daily.   TechLite Plus Pen Needles 32G X 4 MM Misc Generic drug: Insulin Pen Needle USE AS DIRECTED WITH INSULIN   traMADol 50 MG tablet Commonly known as: ULTRAM Take 50 mg by mouth every 6 (six) hours as needed for severe pain (pain score 7-10).   Trulicity 1.5 MG/0.5ML Soaj Generic drug: Dulaglutide Inject 1.5 mg into the skin once a week.   vitamin C 1000 MG tablet Take 1,000 mg by mouth daily.   Vitamin D (Ergocalciferol) 1.25 MG (50000 UNIT) Caps capsule Commonly known as: DRISDOL Take 1 capsule (50,000 Units total) by mouth once a week.         ALLERGIES: No Known Allergies   REVIEW OF SYSTEMS: A comprehensive ROS was conducted with the patient and is negative except as per HPI      OBJECTIVE:   VITAL SIGNS: BP 136/70 (BP Location: Left Arm, Patient Position: Sitting, Cuff Size: Large)   Pulse 76   Resp 20   Ht 5\' 10"  (1.778 m)   Wt 211 lb 12.8 oz (96.1 kg)   SpO2 96%   BMI 30.39 kg/m    PHYSICAL EXAM:  General: Pt appears well and is in NAD  Neck: General: Supple without adenopathy or carotid bruits. Thyroid: Thyroid size normal.  No goiter or nodules appreciated.   Lungs: Clear with  good BS bilat with no rales, rhonchi, or wheezes  Heart: RRR   Abdomen:  soft, nontender  Extremities:  Lower extremities - No pretibial edema. No lesions.  Neuro: MS is good with appropriate affect, pt is alert and Ox3    DM foot exam: 10/14/2023   The skin of the feet is intact without sores or ulcerations. The pedal pulses are 2+ on right and 2+ on left. The sensation is absent to a screening 5.07, 10 gram monofilament at the left great toe   DATA REVIEWED:  Lab Results  Component Value Date   HGBA1C 13.0 (A) 09/25/2023   HGBA1C 9.3 (A) 12/17/2022   HGBA1C 9.7 (H) 08/30/2022    Latest Reference Range & Units 09/25/23 11:59  Sodium 135 - 145 mEq/L 136  Potassium 3.5 - 5.1 mEq/L 4.2  Chloride 96 - 112 mEq/L 100  CO2 19 - 32 mEq/L 28  Glucose 70 - 99 mg/dL 540 (H)  BUN 6 - 23 mg/dL 16  Creatinine 9.81 - 1.91 mg/dL 4.78  Calcium 8.4 - 29.5 mg/dL 9.4  Alkaline Phosphatase 39 - 117 U/L 86  Albumin 3.5 - 5.2 g/dL 4.1  Lipase 62.1 - 30.8 U/L 25.0  AST 0 - 37 U/L 16  ALT 0 - 53 U/L 15  Total Protein 6.0 - 8.3 g/dL 7.6  Bilirubin, Direct 0.0 - 0.3 mg/dL 0.2  Total Bilirubin 0.2 - 1.2 mg/dL 0.8  GFR >65.78 mL/min 65.50    Latest Reference Range & Units 09/25/23 11:59  Total CHOL/HDL Ratio  4  Cholesterol 0 - 200 mg/dL 469  HDL Cholesterol >62.95 mg/dL 28.41 (L)  LDL (calc) 0 - 99 mg/dL 74  NonHDL  324.40  Triglycerides 0.0 - 149.0 mg/dL 102.7  VLDL 0.0 - 25.3 mg/dL 66.4    Latest Reference Range & Units 09/25/23 11:59  TSH 0.35 - 5.50 uIU/mL 1.93   T4,Free(Direct) 0.60 - 1.60 ng/dL 4.03      ASSESSMENT / PLAN / RECOMMENDATIONS:   1) Type 2 Diabetes Mellitus, Poorly controlled, With neuropathic  complications - Most recent A1c of 13.0 %. Goal A1c < 7.0 %.     -Patient with social minutes, and was not able to obtain his medications due to cost for approximately 8 months, he was able to get Medicaid, but unfortunately he is family-planning and does not pay for diabetes medications/supplies -I again discussed the risk of blindness, ESRD, amputation, CVA/CAD with uncontrolled DM -Intolerant to Ozempic, Trulicity is cost prohibitive - Not a candidate for pioglitazone due to lower extremity edema - I have recommended increasing basal insulin and starting prandial insulin with each meal as below  MEDICATIONS: Stop glipizide 5 mg, 1 tab twice daily Start Humalog 6 units with each meal Increase Lantus 20 units daily  EDUCATION / INSTRUCTIONS: BG monitoring instructions: Patient is instructed to check his blood sugars 1 times a day, fasting. Call [facility] if: BG persistently < 70  I reviewed the Rule of 15 for the treatment of hypoglycemia in detail with the patient. Literature supplied.   2) Diabetic complications:  Eye: Does not have known diabetic retinopathy.   Neuro/ Feet: Does have known diabetic peripheral neuropathy. Renal: Patient does not have known baseline CKD. He is  on an ACEI/ARB at present.   Follow-up in 3 months   Signed electronically by: Lyndle Herrlich, MD  [facility]  [facility] [address] [place], [place] [address] Phone: [phone] FAX: [phone]  CC: Abram Abraham, NP-C [address] [place] [address] Phone: [phone]  Fax: [phone]    Return to Endocrinology clinic as below: Future Appointments  Date Time Provider Department Center  10/21/2023  8:20 AM [facility] 2 [facility]  10/25/2023  11:00 AM Henson, Vickie L, NP-C [facility] None

## 2023-10-14 NOTE — Patient Instructions (Addendum)
 Stop Glipizide  Start Humalog/Novolog at 6 units before each meal  Increase Lantus to 20 units at night    HOW TO TREAT LOW BLOOD SUGARS (Blood sugar LESS THAN 70 MG/DL) Please follow the RULE OF 15 for the treatment of hypoglycemia treatment (when your (blood sugars are less than 70 mg/dL)   STEP 1: Take 15 grams of carbohydrates when your blood sugar is low, which includes:  3-4 GLUCOSE TABS  OR 3-4 OZ OF JUICE OR REGULAR SODA OR ONE TUBE OF GLUCOSE GEL    STEP 2: RECHECK blood sugar in 15 MINUTES STEP 3: If your blood sugar is still low at the 15 minute recheck --> then, go back to STEP 1 and treat AGAIN with another 15 grams of carbohydrates.

## 2023-10-15 ENCOUNTER — Other Ambulatory Visit: Payer: Self-pay

## 2023-10-17 ENCOUNTER — Other Ambulatory Visit: Payer: Self-pay

## 2023-10-18 ENCOUNTER — Encounter: Payer: Self-pay | Admitting: Family Medicine

## 2023-10-21 ENCOUNTER — Inpatient Hospital Stay: Admission: RE | Admit: 2023-10-21 | Source: Ambulatory Visit

## 2023-10-24 ENCOUNTER — Other Ambulatory Visit: Payer: Self-pay

## 2023-10-25 ENCOUNTER — Ambulatory Visit: Admitting: Family Medicine

## 2023-10-30 ENCOUNTER — Ambulatory Visit: Admitting: Family Medicine

## 2023-11-05 ENCOUNTER — Ambulatory Visit: Admitting: Family Medicine

## 2023-11-05 ENCOUNTER — Encounter: Payer: Self-pay | Admitting: Family Medicine

## 2023-11-06 ENCOUNTER — Encounter (HOSPITAL_COMMUNITY): Payer: Self-pay

## 2023-11-07 ENCOUNTER — Other Ambulatory Visit

## 2023-11-08 ENCOUNTER — Ambulatory Visit (HOSPITAL_COMMUNITY)

## 2023-11-12 ENCOUNTER — Other Ambulatory Visit: Payer: Self-pay

## 2023-11-13 ENCOUNTER — Other Ambulatory Visit: Payer: Self-pay

## 2023-11-13 ENCOUNTER — Ambulatory Visit: Admitting: Family Medicine

## 2023-11-13 ENCOUNTER — Encounter: Payer: Self-pay | Admitting: Family Medicine

## 2023-11-13 VITALS — BP 136/90 | HR 75 | Temp 97.8°F | Ht 70.0 in | Wt 211.0 lb

## 2023-11-13 DIAGNOSIS — I10 Essential (primary) hypertension: Secondary | ICD-10-CM | POA: Diagnosis not present

## 2023-11-13 DIAGNOSIS — E1169 Type 2 diabetes mellitus with other specified complication: Secondary | ICD-10-CM

## 2023-11-13 DIAGNOSIS — Z91199 Patient's noncompliance with other medical treatment and regimen due to unspecified reason: Secondary | ICD-10-CM

## 2023-11-13 DIAGNOSIS — E1165 Type 2 diabetes mellitus with hyperglycemia: Secondary | ICD-10-CM | POA: Diagnosis not present

## 2023-11-13 DIAGNOSIS — R809 Proteinuria, unspecified: Secondary | ICD-10-CM

## 2023-11-13 DIAGNOSIS — E785 Hyperlipidemia, unspecified: Secondary | ICD-10-CM

## 2023-11-13 DIAGNOSIS — E559 Vitamin D deficiency, unspecified: Secondary | ICD-10-CM

## 2023-11-13 MED ORDER — AMLODIPINE BESYLATE 10 MG PO TABS
10.0000 mg | ORAL_TABLET | Freq: Every day | ORAL | 0 refills | Status: DC
  Filled 2023-11-13: qty 90, 90d supply, fill #0

## 2023-11-13 MED ORDER — HYDROCHLOROTHIAZIDE 25 MG PO TABS
25.0000 mg | ORAL_TABLET | Freq: Every day | ORAL | 0 refills | Status: DC
  Filled 2024-01-08 – 2024-01-31 (×3): qty 90, 90d supply, fill #0

## 2023-11-13 MED ORDER — LOSARTAN POTASSIUM 100 MG PO TABS
100.0000 mg | ORAL_TABLET | Freq: Every morning | ORAL | 0 refills | Status: DC
  Filled 2024-01-08 – 2024-01-31 (×3): qty 90, 90d supply, fill #0

## 2023-11-13 MED ORDER — ATORVASTATIN CALCIUM 10 MG PO TABS
10.0000 mg | ORAL_TABLET | Freq: Every day | ORAL | 0 refills | Status: DC
  Filled 2023-11-13 – 2024-01-31 (×4): qty 90, 90d supply, fill #0

## 2023-11-13 MED ORDER — CARVEDILOL 12.5 MG PO TABS
12.5000 mg | ORAL_TABLET | Freq: Two times a day (BID) | ORAL | 1 refills | Status: AC
  Filled 2024-01-08 – 2024-01-31 (×3): qty 180, 90d supply, fill #0
  Filled 2024-07-08: qty 180, 90d supply, fill #1

## 2023-11-13 NOTE — Progress Notes (Signed)
 Subjective:     Patient ID: [name] [name], male    DOB: 23-May-1961, 63 y.o.   MRN: [id number]  Chief Complaint  Patient presents with   Medical Management of Chronic Issues    Fasting 4 week f/u    HPI  History of Present Illness         Here to follow up on chronic health conditions.   HTN- states he ran out of his HTN medications last week.   He saw Dr Rosalea Collin.  For his diabetes. Last A1c 13.0%   States he ran out of his insulin  Sunday and did not get Truilicity  He is taking Humalog  6 units with meals   Did not get CT abdomen pelvis for abdominal pain.  Reports feeling well today and no concerns.   Health Maintenance Due  Topic Date Due   OPHTHALMOLOGY EXAM  Never done   Colonoscopy  Never done    Past Medical History:  Diagnosis Date   Arthritis    Depression    Diabetes mellitus    Hypertension     Past Surgical History:  Procedure Laterality Date   ANKLE SURGERY  1996   L ankle   CLAVICLE SURGERY  2010   JOINT REPLACEMENT     metal plate in lft knee   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   Procedure: RIGHT TOTAL HIP ARTHROPLASTY ANTERIOR APPROACH;  Surgeon: Arnie Lao, MD;  Location: [facility];  Service: Orthopedics;  Laterality: Right;   UMBILICAL HERNIA REPAIR  2009    Family History  Problem Relation Age of Onset   Diabetes Mother    Stroke Mother    Diabetes Sister    Diabetes Sister    Diabetes Sister    Leukemia Maternal Aunt     Social History   Socioeconomic History   Marital status: Married    Spouse name: Not on file   Number of children: Not on file   Years of education: Not on file   Highest education level: 12th grade  Occupational History   Not on file  Tobacco Use   Smoking status: Some Days    Types: Cigars   Smokeless tobacco: Never   Tobacco comments:    a cigar occasionally  Substance and Sexual Activity   Alcohol use: Yes    Comment: beer - occasionally   Drug use: No   Sexual  activity: Not on file  Other Topics Concern   Not on file  Social History Narrative   Not on file   Social Drivers of Health   Financial Resource Strain: High Risk (08/03/2021)   Received from [facility], [facility]   Overall Financial Resource Strain (CARDIA)    Difficulty of Paying Living Expenses: Hard  Food Insecurity: No Food Insecurity (11/11/2023)   Hunger Vital Sign    Worried About Running Out of Food in the Last Year: Never true    Ran Out of Food in the Last Year: Never true  Transportation Needs: No Transportation Needs (11/11/2023)   PRAPARE - Administrator, Civil Service (Medical): No    Lack of Transportation (Non-Medical): No  Physical Activity: Sufficiently Active (11/11/2023)   Exercise Vital Sign    Days of Exercise per Week: 6 days    Minutes of Exercise per Session: 50 min  Stress: No Stress Concern Present (11/11/2023)   [employer] of Occupational Health - Occupational Stress Questionnaire    Feeling of Stress : Only a  little  Social Connections: Socially Integrated (11/11/2023)   Social Connection and Isolation Panel [NHANES]    Frequency of Communication with Friends and Family: More than three times a week    Frequency of Social Gatherings with Friends and Family: Three times a week    Attends Religious Services: More than 4 times per year    Active Member of Clubs or Organizations: Yes    Attends Banker Meetings: More than 4 times per year    Marital Status: Married  Catering manager Violence: Unknown (10/02/2021)   Received from [employer], [facility]   HITS    Physically Hurt: Not on file    Insult or Talk Down To: Not on file    Threaten Physical Harm: Not on file    Scream or Curse: Not on file    Outpatient Medications Prior to Visit  Medication Sig Dispense Refill   Accu-Chek Softclix Lancets lancets Use to check blood sugars 3 times daily. 100 each 5   Ascorbic Acid (VITAMIN C) 1000 MG tablet Take  1,000 mg by mouth daily.     aspirin  325 MG tablet Take 1 tablet (325 mg total) by mouth daily. 90 tablet 3   Blood Glucose Monitoring Suppl (BLOOD GLUCOSE MONITOR SYSTEM) w/Device KIT May substitute to any manufacturer covered by patient's insurance. 1 kit 0   Blood Glucose Monitoring Suppl (ONETOUCH VERIO FLEX SYSTEM) w/Device KIT by Does not apply route.     diclofenac (VOLTAREN) 75 MG EC tablet Take 75 mg by mouth 2 (two) times daily.     ergocalciferol  (VITAMIN D2) 1.25 MG (50000 UT) capsule Take 1 capsule (50,000 Units total) by mouth once a week. 6 capsule 0   Glucose Blood (BLOOD GLUCOSE TEST STRIPS) STRP Use to check blood sugars 3 times daily. 200 strip 5   glucose blood test strip Use to test blood glucose twice daily: once before eating and again 2 hours after 100 each 12   insulin  glargine (LANTUS  SOLOSTAR) 100 UNIT/ML Solostar Pen Inject 20 Units into the skin daily. 15 mL 4   insulin  lispro (HUMALOG  KWIKPEN) 100 UNIT/ML KwikPen Inject 6 Units into the skin 3 (three) times daily. 15 mL 6   Insulin  Pen Needle (PEN NEEDLES) 32G X 4 MM MISC Use in the morning, at noon, in the evening, and at bedtime. 400 each 2   Lancet Device MISC May substitute to any manufacturer covered by patient's insurance. 1 each 0   methocarbamol  (ROBAXIN ) 500 MG tablet Take 1 tablet (500 mg total) by mouth every 6 (six) hours as needed for muscle spasms. 40 tablet 1   OneTouch Delica Lancets 30G MISC by Does not apply route.     pyridoxine (B-6) 100 MG tablet Take 100 mg by mouth daily.     amLODipine  (NORVASC ) 10 MG tablet Take 1 tablet (10 mg total) by mouth daily. NEEDS APPOINTMENT OR FURTHER REFILLS WILL BE DENIED. 30 tablet 0   atorvastatin  (LIPITOR) 10 MG tablet Take 1 tablet (10 mg total) by mouth daily. 90 tablet 0   carvedilol  (COREG ) 12.5 MG tablet Take 1 tablet (12.5 mg total) by mouth 2 (two) times daily with a meal. 180 tablet 1   hydrochlorothiazide  (HYDRODIURIL ) 25 MG tablet Take 1 tablet (25 mg  total) by mouth daily. 90 tablet 0   losartan  (COZAAR ) 100 MG tablet Take 1 tablet (100 mg total) by mouth every morning. 90 tablet 0   oxyCODONE  (OXY IR/ROXICODONE ) 5 MG immediate release  tablet Take 5 mg by mouth every 4 (four) hours as needed for severe pain (pain score 7-10). (Patient not taking: Reported on 11/13/2023)     traMADol (ULTRAM) 50 MG tablet Take 50 mg by mouth every 6 (six) hours as needed for severe pain (pain score 7-10). (Patient not taking: Reported on 11/13/2023)     No facility-administered medications prior to visit.    No Known Allergies  Review of Systems  Constitutional:  Negative for chills, fever and malaise/fatigue.  Eyes:  Negative for blurred vision and double vision.  Respiratory:  Negative for shortness of breath.   Cardiovascular:  Negative for chest pain, palpitations and leg swelling.  Gastrointestinal:  Negative for abdominal pain, constipation, diarrhea, nausea and vomiting.  Genitourinary:  Negative for dysuria, frequency and urgency.  Neurological:  Negative for dizziness, focal weakness and headaches.       Objective:     Physical Exam Constitutional:      General: He is not in acute distress.    Appearance: He is not ill-appearing.  Eyes:     Extraocular Movements: Extraocular movements intact.     Conjunctiva/sclera: Conjunctivae normal.  Cardiovascular:     Rate and Rhythm: Normal rate.  Pulmonary:     Effort: Pulmonary effort is normal.  Musculoskeletal:     Cervical back: Normal range of motion and neck supple.  Skin:    General: Skin is warm and dry.  Neurological:     General: No focal deficit present.     Mental Status: He is alert and oriented to person, place, and time.  Psychiatric:        Mood and Affect: Mood normal.        Behavior: Behavior normal.        Thought Content: Thought content normal.      BP (!) 136/90   Pulse 75   Temp 97.8 F (36.6 C) (Temporal)   Ht 5\' 10"  (1.778 m)   Wt 211 lb (95.7 kg)    SpO2 98%   BMI 30.28 kg/m  Wt Readings from Last 3 Encounters:  11/13/23 211 lb (95.7 kg)  10/14/23 211 lb 12.8 oz (96.1 kg)  09/25/23 209 lb (94.8 kg)       Assessment & Plan:   Problem List Items Addressed This Visit     Hyperlipidemia associated with type 2 diabetes mellitus (HCC)   Relevant Medications   losartan  (COZAAR ) 100 MG tablet   hydrochlorothiazide  (HYDRODIURIL ) 25 MG tablet   carvedilol  (COREG ) 12.5 MG tablet   amLODipine  (NORVASC ) 10 MG tablet   atorvastatin  (LIPITOR) 10 MG tablet   Personal history of noncompliance with medical treatment, presenting hazards to health   Relevant Orders   AMB Referral VBCI Care Management   Primary hypertension - Primary   Relevant Medications   losartan  (COZAAR ) 100 MG tablet   hydrochlorothiazide  (HYDRODIURIL ) 25 MG tablet   carvedilol  (COREG ) 12.5 MG tablet   amLODipine  (NORVASC ) 10 MG tablet   atorvastatin  (LIPITOR) 10 MG tablet   Other Relevant Orders   AMB Referral VBCI Care Management   Uncontrolled type 2 diabetes mellitus with hyperglycemia (HCC)   Relevant Medications   losartan  (COZAAR ) 100 MG tablet   atorvastatin  (LIPITOR) 10 MG tablet   Other Relevant Orders   AMB Referral VBCI Care Management   Urine test positive for microalbuminuria   Vitamin D  deficiency    He is here to follow-up on chronic health conditions.  It is unclear which medications he  is taking consistently.  He may need that better to simply follow-up and remember to take his medications.  Referral to pharmacy for further evaluations assistance.  He may need a pill pack. Encouraged him to reach out to his endocrinologist regarding diabetes medications. Advised him that he is overdue for an eye exam.  He states he will call to schedule. Unclear where he had his last colonoscopy and when.  I asked him to get this information and get back to me. Refilled medications for hypertension and cholesterol.  I would like for him to follow-up with me in 8  weeks.  I have discontinued Primus Brookes B. Goleman's traMADol and oxyCODONE . I have also changed his amLODipine . Additionally, I am having him maintain his methocarbamol , OneTouch Verio Flex System, OneTouch Delica Lancets 30G, diclofenac, vitamin C, pyridoxine, aspirin , glucose blood, Blood Glucose Monitor System, BLOOD GLUCOSE TEST STRIPS, Lancet Device, Accu-Chek Softclix Lancets, ergocalciferol , Lantus  SoloStar, Pen Needles, insulin  lispro, losartan , hydrochlorothiazide , carvedilol , and atorvastatin .  Meds ordered this encounter  Medications   losartan  (COZAAR ) 100 MG tablet    Sig: Take 1 tablet (100 mg total) by mouth every morning.    Dispense:  90 tablet    Refill:  0   hydrochlorothiazide  (HYDRODIURIL ) 25 MG tablet    Sig: Take 1 tablet (25 mg total) by mouth daily.    Dispense:  90 tablet    Refill:  0   carvedilol  (COREG ) 12.5 MG tablet    Sig: Take 1 tablet (12.5 mg total) by mouth 2 (two) times daily with a meal.    Dispense:  180 tablet    Refill:  1   amLODipine  (NORVASC ) 10 MG tablet    Sig: Take 1 tablet (10 mg total) by mouth daily.    Dispense:  90 tablet    Refill:  0   atorvastatin  (LIPITOR) 10 MG tablet    Sig: Take 1 tablet (10 mg total) by mouth daily.    Dispense:  90 tablet    Refill:  0

## 2023-11-13 NOTE — Patient Instructions (Addendum)
 Please go downstairs for labs before you leave.  Let us  know if you need medication refills before you run out.  Reach out to Dr. Rosalea Collin about your diabetes medications  You are overdue for an eye exam.  Please schedule this with your ophthalmologist.  Let them know it needs to be a diabetes eye exam.  If you can recall where you had your last colonoscopy, let me know.  Our pharmacist, Rainelle Bur, will call you to discuss your medications.

## 2023-11-14 ENCOUNTER — Telehealth: Payer: Self-pay | Admitting: *Deleted

## 2023-11-14 NOTE — Progress Notes (Signed)
 Care Guide Pharmacy Note  11/14/2023 Name: [name] [name] MRN: [id number] DOB: May 01, 1961  Referred By: Abram Abraham, NP-C Reason for referral: Complex Care Management (Outreach to schedule referral with pharmacist )   [name] [name] is a 63 y.o. year old male who is a primary care patient of Maree Shames, Vickie L, NP-C.  [name] [name] was referred to the pharmacist for assistance related to: DMII  Successful contact was made with the patient to discuss pharmacy services including being ready for the pharmacist to call at least 5 minutes before the scheduled appointment time and to have medication bottles and any blood pressure readings ready for review. The patient agreed to meet with the pharmacist via telephone visit on 11/19/2023  Kandis Ormond, CMA [place]  [facility], [facility] Guide Direct Dial : [phone]  Fax: [phone] Website: [email or web address]

## 2023-11-19 ENCOUNTER — Other Ambulatory Visit (INDEPENDENT_AMBULATORY_CARE_PROVIDER_SITE_OTHER): Admitting: Pharmacist

## 2023-11-19 DIAGNOSIS — E1165 Type 2 diabetes mellitus with hyperglycemia: Secondary | ICD-10-CM

## 2023-11-19 DIAGNOSIS — I10 Essential (primary) hypertension: Secondary | ICD-10-CM

## 2023-11-19 NOTE — Progress Notes (Signed)
 11/19/2023 Name: [name] [name] MRN: [id number] DOB: [birth year]  Chief Complaint  Patient presents with   Diabetes   Medication Management    [name] [name] is a 63 y.o. year old male who presented for a telephone visit.   They were referred to the pharmacist by their PCP for assistance in managing diabetes and hypertension.    Subjective:  Care Team: Primary Care Provider: Abram Abraham, NP-C ; Next Scheduled Visit: 01/09/24 Endocrinologist Shamleffer; Next Scheduled Visit: 01/22/24  Medication Access/Adherence  Current Pharmacy:  [facility] [address] [place] [address] Phone: [phone] Fax: [phone]   Patient reports affordability concerns with their medications: Yes  Patient reports access/transportation concerns to their pharmacy: No  Patient reports adherence concerns with their medications:  Yes    Pt has history of nonadherence to medications.   Diabetes:  Current medications: Lantus  20 units daily, Humalog  6 units prior to reach meal  Medications tried in the past: Was previously on Trulicity , however was unable to afford it due to insurance   Current glucose readings: reports BG around 170 Has not tried CGM, but willing to try if he is able to get it through insurance   Current meal patterns:  Feels that his BG are often elevated due to his eating habits   Hypertension:  Current medications: amlodipine  10 mg daily, carvedilol  12.5 mg twice daily, hydrochlorothiazide  25 mg daily *He reports he has been doing better taking medications daily  Patient does not have a validated, automated, upper arm home BP cuff Current blood pressure readings: none    Objective:  Lab Results  Component Value Date   HGBA1C 13.0 (A) 09/25/2023    Lab Results  Component Value Date   CREATININE 1.19 09/25/2023   BUN 16 09/25/2023   NA 136 09/25/2023   K 4.2 09/25/2023   CL 100  09/25/2023   CO2 28 09/25/2023    Lab Results  Component Value Date   CHOL 136 09/25/2023   HDL 33.20 (L) 09/25/2023   LDLCALC 74 09/25/2023   LDLDIRECT 35.0 01/12/2022   TRIG 144.0 09/25/2023   CHOLHDL 4 09/25/2023    Medications Reviewed Today     Reviewed by Dion Frankel, RPH (Pharmacist) on 11/19/23 at 1640  Med List Status: <None>   Medication Order Taking? Sig Documenting Provider Last Dose Status Informant  Accu-Chek Softclix Lancets lancets [id number]  Use to check blood sugars 3 times daily. Henson, Vickie L, NP-C  Active   amLODipine  (NORVASC ) 10 MG tablet [id number] Yes Take 1 tablet (10 mg total) by mouth daily. Henson, Vickie L, NP-C Taking Active   Ascorbic Acid (VITAMIN C) 1000 MG tablet [id number]  Take 1,000 mg by mouth daily. [provider]  Active   aspirin  325 MG tablet [id number]  Take 1 tablet (325 mg total) by mouth daily. Henson, Vickie L, NP-C  Active   atorvastatin  (LIPITOR) 10 MG tablet [id number] Yes Take 1 tablet (10 mg total) by mouth daily. Henson, Vickie L, NP-C Taking Active   Blood Glucose Monitoring Suppl (BLOOD GLUCOSE MONITOR SYSTEM) w/Device KIT [id number]  May substitute to any manufacturer covered by patient's insurance. Henson, Vickie L, NP-C  Active   Blood Glucose Monitoring Suppl (ONETOUCH VERIO FLEX SYSTEM) w/Device KIT [id number]  by Does not apply route. [provider]  Active   carvedilol  (COREG ) 12.5 MG tablet [id number] Yes Take 1 tablet (12.5 mg total) by mouth 2 (  two) times daily with a meal. Henson, Vickie L, NP-C Taking Active   diclofenac (VOLTAREN) 75 MG EC tablet [id number]  Take 75 mg by mouth 2 (two) times daily. [provider]  Active            Med Note Finley Hugh, CHERYL A   Mon Aug 13, 2022  9:09 AM) [place] Pear if in pain   ergocalciferol  (VITAMIN D2) 1.25 MG (50000 UT) capsule [id number]  Take 1 capsule (50,000 Units total) by mouth once a week. Henson, Vickie L, NP-C  Active   Glucose Blood (BLOOD  GLUCOSE TEST STRIPS) STRP [id number]  Use to check blood sugars 3 times daily. Henson, Vickie L, NP-C  Active   glucose blood test strip [id number]  Use to test blood glucose twice daily: once before eating and again 2 hours after Maree Shames, Vickie L, NP-C  Active   hydrochlorothiazide  (HYDRODIURIL ) 25 MG tablet [id number]  Take 1 tablet (25 mg total) by mouth daily. Henson, Vickie L, NP-C  Active   insulin  glargine (LANTUS  SOLOSTAR) 100 UNIT/ML Solostar Pen [id number] Yes Inject 20 Units into the skin daily. Shamleffer, Julian Obey, MD Taking Active   insulin  lispro (HUMALOG  KWIKPEN) 100 UNIT/ML KwikPen [id number] Yes Inject 6 Units into the skin 3 (three) times daily. Shamleffer, Julian Obey, MD Taking Active   Insulin  Pen Needle (PEN NEEDLES) 32G X 4 MM MISC [id number]  Use in the morning, at noon, in the evening, and at bedtime. Shamleffer, Julian Obey, MD  Active   Lancet Device MISC [id number]  May substitute to any manufacturer covered by patient's insurance. Henson, Vickie L, NP-C  Active   losartan  (COZAAR ) 100 MG tablet [id number] Yes Take 1 tablet (100 mg total) by mouth every morning. Henson, Vickie L, NP-C Taking Active   methocarbamol  (ROBAXIN ) 500 MG tablet [id number]  Take 1 tablet (500 mg total) by mouth every 6 (six) hours as needed for muscle spasms. Bronson Canny, PA-C  Active Self  OneTouch Delica Lancets 30G MISC [id number]  by Does not apply route. [provider]  Active   pyridoxine (B-6) 100 MG tablet [id number]  Take 100 mg by mouth daily. [provider]  Active               Assessment/Plan:   Diabetes: - Currently uncontrolled, A1c goal <7% - Was able to get Freestyle Libre 3 Plus ($40 per 30DS) and Trulicity  ($25 per 30 DS) through insurance at affordable prices - Rxs sent to pharmacy   Hypertension: - Currently uncontrolled, BP goal <130/80 - Reviewed long term cardiovascular and renal outcomes of uncontrolled blood pressure - Reviewed  appropriate blood pressure monitoring technique and reviewed goal blood pressure. Recommended to check home blood pressure and heart rate  - Recommend to continue current regimen    Follow Up Plan: 6/9  Rainelle Bur, PharmD, BCPS, CPP Clinical Pharmacist Practitioner [employer] [facility] at [facility] [phone]

## 2023-11-20 ENCOUNTER — Other Ambulatory Visit: Payer: Self-pay

## 2023-11-20 MED ORDER — FREESTYLE LIBRE 3 PLUS SENSOR MISC
5 refills | Status: DC
  Filled 2023-11-20 – 2023-12-16 (×2): qty 2, 30d supply, fill #0
  Filled 2024-01-21 – 2024-03-23 (×5): qty 2, 30d supply, fill #1

## 2023-11-20 MED ORDER — TRULICITY 0.75 MG/0.5ML ~~LOC~~ SOAJ
0.7500 mg | SUBCUTANEOUS | 0 refills | Status: DC
  Filled 2023-11-20 – 2023-12-16 (×2): qty 2, 28d supply, fill #0

## 2023-11-20 NOTE — Patient Instructions (Signed)
 It was a pleasure speaking with you today!  Restart Trulicity  at 0.75 mg once weekly.  Start using Freestyle Libre 3 Plus sensors to better monitor your glucose levels and allow for easier medication adjustments.  Be sure to take your medications daily. It is recommended to use a pill box/organizer to help remember to take them regularly.  Feel free to call with any questions or concerns!  Rainelle Bur, PharmD, BCPS, CPP Clinical Pharmacist Practitioner [place] [facility] at [facility] [phone]

## 2023-12-02 ENCOUNTER — Other Ambulatory Visit: Payer: Self-pay

## 2023-12-09 ENCOUNTER — Other Ambulatory Visit (INDEPENDENT_AMBULATORY_CARE_PROVIDER_SITE_OTHER): Admitting: Pharmacist

## 2023-12-09 DIAGNOSIS — E1165 Type 2 diabetes mellitus with hyperglycemia: Secondary | ICD-10-CM

## 2023-12-09 DIAGNOSIS — I10 Essential (primary) hypertension: Secondary | ICD-10-CM

## 2023-12-09 NOTE — Progress Notes (Signed)
   12/09/2023 Name: BRONISLAUS VERDELL MRN: [id number] DOB: 10/17/1960  Chief Complaint  Patient presents with   Diabetes   Medication Management   Hypertension    [name] [name] is a 63 y.o. year old male who presented for a telephone visit.   They were referred to the pharmacist by their PCP for assistance in managing diabetes and hypertension.    Subjective:  Care Team: Primary Care Provider: Abram Abraham, NP-C ; Next Scheduled Visit: 01/09/24 Endocrinologist Shamleffer; Next Scheduled Visit: 01/22/24  Medication Access/Adherence  Current Pharmacy:  [facility] - [facility] [address] [place] [address] Phone: [phone] Fax: [phone]   Patient reports affordability concerns with their medications: Yes  Patient reports access/transportation concerns to their pharmacy: No  Patient reports adherence concerns with their medications:  Yes    Pt has history of nonadherence to medications.   Diabetes:  Current medications: Lantus  20 units daily, Humalog  6 units prior to reach meal  Medications tried in the past: Was previously on Trulicity , however was unable to afford it due to insurance   **Last appt did prescribe Trulicity  to restart and Freestyle Libre 3, however pt notes he has not gotten it yet due to funds being low. He states he plans to pick them up this week.  Current glucose readings: he notes BG have been high   Current meal patterns:  Feels that his BG are often elevated due to his eating habits   Hypertension:  Current medications: amlodipine  10 mg daily, carvedilol  12.5 mg twice daily, hydrochlorothiazide  25 mg daily *He reports he has been doing better taking medications daily  Patient does not have a validated, automated, upper arm home BP cuff Current blood pressure readings: 120/92 on last check    Objective:  Lab Results  Component Value Date   HGBA1C 13.0 (A) 09/25/2023     Lab Results  Component Value Date   CREATININE 1.19 09/25/2023   BUN 16 09/25/2023   NA 136 09/25/2023   K 4.2 09/25/2023   CL 100 09/25/2023   CO2 28 09/25/2023    Lab Results  Component Value Date   CHOL 136 09/25/2023   HDL 33.20 (L) 09/25/2023   LDLCALC 74 09/25/2023   LDLDIRECT 35.0 01/12/2022   TRIG 144.0 09/25/2023   CHOLHDL 4 09/25/2023    Medications Reviewed Today   Medications were not reviewed in this encounter       Assessment/Plan:   Diabetes: - Currently uncontrolled, A1c goal <7% - Was able to get Freestyle Libre 3 Plus ($40 per 30DS) and Trulicity  ($25 per 30 DS) through insurance at affordable prices - pt states he will go pick these up - Will send instructions to connect top LibreView  Hypertension: - Currently uncontrolled, BP goal <130/80 - Reviewed long term cardiovascular and renal outcomes of uncontrolled blood pressure - Reviewed appropriate blood pressure monitoring technique and reviewed goal blood pressure. Recommended to check home blood pressure and heart rate  - Recommend to continue current regimen    Follow Up Plan: 6/23  Rainelle Bur, PharmD, BCPS, CPP Clinical Pharmacist Practitioner [place] [facility] at [facility] [phone]

## 2023-12-09 NOTE — Patient Instructions (Signed)
 It was a pleasure speaking with you today!  Start Trulicity  and Freestyle Libre3.   To share your [facility] blood sugar readings with me, go in your [facility] app  Go to: Menu > Connected Apps > LibreView > Connect to a Education officer, community ID Our Practice ID is [email or web address]   Feel free to call with any questions or concerns!  Rainelle Bur, PharmD, BCPS, CPP Clinical Pharmacist Practitioner [place] [facility] at [facility] [phone]

## 2023-12-12 ENCOUNTER — Ambulatory Visit (HOSPITAL_COMMUNITY)

## 2023-12-16 ENCOUNTER — Other Ambulatory Visit: Payer: Self-pay

## 2023-12-17 ENCOUNTER — Other Ambulatory Visit: Payer: Self-pay

## 2023-12-23 ENCOUNTER — Ambulatory Visit (HOSPITAL_COMMUNITY): Admission: RE | Admit: 2023-12-23 | Source: Ambulatory Visit

## 2023-12-23 ENCOUNTER — Other Ambulatory Visit (INDEPENDENT_AMBULATORY_CARE_PROVIDER_SITE_OTHER): Admitting: Pharmacist

## 2023-12-23 ENCOUNTER — Encounter (HOSPITAL_COMMUNITY): Payer: Self-pay

## 2023-12-23 DIAGNOSIS — E1165 Type 2 diabetes mellitus with hyperglycemia: Secondary | ICD-10-CM

## 2023-12-23 NOTE — Patient Instructions (Signed)
 It was a pleasure speaking with you today!  Continue your current regimen. I will follow up in 2 weeks.  Feel free to call with any questions or concerns!  Darrelyn Drum, PharmD, BCPS, CPP Clinical Pharmacist Practitioner [place] [facility] at [facility] [phone]

## 2023-12-23 NOTE — Progress Notes (Signed)
 12/23/2023 Name: [name] [name] MRN: [id number] DOB: 1961-02-04  Chief Complaint  Patient presents with   Diabetes   Medication Management    KAGAN MUTCHLER is a 63 y.o. year old male who presented for a telephone visit.   They were referred to the pharmacist by their PCP for assistance in managing diabetes and hypertension.    Subjective:  Care Team: Primary Care Provider: Lendia Boby CROME, NP-C ; Next Scheduled Visit: 01/09/24 Endocrinologist Shamleffer; Next Scheduled Visit: 01/22/24  Medication Access/Adherence  Current Pharmacy:  [facility] - [facility] [address], [address] [place] [address] Phone: [phone] Fax: [phone]   Patient reports affordability concerns with their medications: Yes  Patient reports access/transportation concerns to their pharmacy: No  Patient reports adherence concerns with their medications:  Yes    Pt has history of nonadherence to medications.   Diabetes:  Current medications: Lantus  20 units daily, Humalog  6 units prior to reach meal, Trulicity  0.75 mg weekly (started 1 week ago) Medications tried in the past: Was previously on Trulicity , however was unable to afford it due to insurance    Current glucose readings: Started using Freestyle Libre 3 *Current BG average is 179 however only 1.5 days of data   Current meal patterns:  Feels that his BG are often elevated due to his eating habits   Hypertension:  Current medications: amlodipine  10 mg daily, carvedilol  12.5 mg twice daily, hydrochlorothiazide  25 mg daily *He reports he has been doing better taking medications daily  Patient does not have a validated, automated, upper arm home BP cuff Current blood pressure readings: 120/92 on last check    Objective:  Lab Results  Component Value Date   HGBA1C 13.0 (A) 09/25/2023    Lab Results  Component Value Date   CREATININE 1.19 09/25/2023   BUN 16 09/25/2023    NA 136 09/25/2023   K 4.2 09/25/2023   CL 100 09/25/2023   CO2 28 09/25/2023    Lab Results  Component Value Date   CHOL 136 09/25/2023   HDL 33.20 (L) 09/25/2023   LDLCALC 74 09/25/2023   LDLDIRECT 35.0 01/12/2022   TRIG 144.0 09/25/2023   CHOLHDL 4 09/25/2023    Medications Reviewed Today     Reviewed by Merceda Lela SAUNDERS, RPH (Pharmacist) on 12/23/23 at [phone]  Med List Status: <None>   Medication Order Taking? Sig Documenting Provider Last Dose Status Informant    Discontinued 12/23/23 0947   amLODipine  (NORVASC ) 10 MG tablet [id number]  Take 1 tablet (10 mg total) by mouth daily. Henson, Vickie L, NP-C  Active   Ascorbic Acid (VITAMIN C) 1000 MG tablet [id number]  Take 1,000 mg by mouth daily. [provider]  Active   aspirin  325 MG tablet [id number]  Take 1 tablet (325 mg total) by mouth daily. Henson, Vickie L, NP-C  Active   atorvastatin  (LIPITOR) 10 MG tablet [id number]  Take 1 tablet (10 mg total) by mouth daily. Lendia Boby CROME, NP-C  Active     Discontinued 12/23/23 9052     Discontinued 12/23/23 0947   carvedilol  (COREG ) 12.5 MG tablet [id number]  Take 1 tablet (12.5 mg total) by mouth 2 (two) times daily with a meal. Henson, Vickie L, NP-C  Active   Continuous Glucose Sensor (FREESTYLE LIBRE 3 PLUS SENSOR) MISC [id number] Yes Change sensor every 15 days. Henson, Vickie L, NP-C  Active   diclofenac (VOLTAREN) 75 MG EC tablet [id number]  Take 75  mg by mouth 2 (two) times daily. [provider]  Active            Med Note ZENA, CHERYL A   Mon Aug 13, 2022  9:09 AM) Emily if in pain   Dulaglutide  (TRULICITY ) 0.75 MG/0.5ML EMMANUEL [id number] Yes Inject 0.75 mg into the skin once a week. Take 0.75 mg for 4 weeks, then we will increase to 1.5 mg. Lendia, Vickie L, NP-C  Active   ergocalciferol  (VITAMIN D2) 1.25 MG (50000 UT) capsule [id number]  Take 1 capsule (50,000 Units total) by mouth once a week. Lendia Boby CROME, NP-C  Active     Discontinued 12/23/23 0947      Discontinued 12/23/23 0947   hydrochlorothiazide  (HYDRODIURIL ) 25 MG tablet [id number]  Take 1 tablet (25 mg total) by mouth daily. Henson, Vickie L, NP-C  Active   insulin  glargine (LANTUS  SOLOSTAR) 100 UNIT/ML Solostar Pen [id number] Yes Inject 20 Units into the skin daily. Shamleffer, Ibtehal Jaralla, MD  Active   insulin  lispro (HUMALOG  KWIKPEN) 100 UNIT/ML KwikPen [id number] Yes Inject 6 Units into the skin 3 (three) times daily. Shamleffer, Donell Cardinal, MD  Active   Insulin  Pen Needle (PEN NEEDLES) 32G X 4 MM MISC [id number]  Use in the morning, at noon, in the evening, and at bedtime. Shamleffer, Donell Cardinal, MD  Active   Lancet Device MISC [id number]  May substitute to any manufacturer covered by patient's insurance. Henson, Vickie L, NP-C  Active   losartan  (COZAAR ) 100 MG tablet [id number]  Take 1 tablet (100 mg total) by mouth every morning. Henson, Vickie L, NP-C  Active   methocarbamol  (ROBAXIN ) 500 MG tablet [id number]  Take 1 tablet (500 mg total) by mouth every 6 (six) hours as needed for muscle spasms. Gretta Bertrum ORN, PA-C  Active Self    Discontinued 12/23/23 0947   pyridoxine (B-6) 100 MG tablet [id number]  Take 100 mg by mouth daily. [provider]  Active              Assessment/Plan:   Diabetes: - Currently uncontrolled, A1c goal <7% - Was able to get Freestyle Libre 3 Plus ($40 per 30DS) and Trulicity  ($25 per 30 DS) through insurance at affordable prices  - Continue Lantus , Humalog , and Trulicity . Expect to need insulin  adjustments after starting Trulicity . - Will increase Trulicity  to 1.5 mg after 4 weeks  Hypertension: - Currently uncontrolled, BP goal <130/80 - Reviewed long term cardiovascular and renal outcomes of uncontrolled blood pressure - Reviewed appropriate blood pressure monitoring technique and reviewed goal blood pressure. Recommended to check home blood pressure and heart rate  - Recommend to continue current regimen    Follow Up  Plan: 7/7  Darrelyn Drum, PharmD, BCPS, CPP Clinical Pharmacist Practitioner [place] [facility] at [facility] [phone]

## 2023-12-26 ENCOUNTER — Ambulatory Visit (HOSPITAL_COMMUNITY)
Admission: RE | Admit: 2023-12-26 | Discharge: 2023-12-26 | Disposition: A | Discharge status: Home / Self Care | Source: Ambulatory Visit | Attending: Family Medicine | Admitting: Family Medicine

## 2023-12-26 DIAGNOSIS — D696 Thrombocytopenia, unspecified: Secondary | ICD-10-CM | POA: Insufficient documentation

## 2023-12-26 DIAGNOSIS — E1142 Type 2 diabetes mellitus with diabetic polyneuropathy: Secondary | ICD-10-CM | POA: Insufficient documentation

## 2023-12-26 DIAGNOSIS — Z794 Long term (current) use of insulin: Secondary | ICD-10-CM | POA: Diagnosis present

## 2023-12-26 DIAGNOSIS — R1012 Left upper quadrant pain: Secondary | ICD-10-CM | POA: Insufficient documentation

## 2023-12-26 LAB — POCT I-STAT CREATININE: Creatinine, Ser: 1.5 mg/dL — ABNORMAL HIGH (ref 0.61–1.24)

## 2023-12-26 MED ORDER — IOHEXOL 300 MG/ML  SOLN
100.0000 mL | Freq: Once | INTRAMUSCULAR | Status: AC | PRN
Start: 2023-12-26 — End: 2023-12-26
  Administered 2023-12-26: 100 mL via INTRAVENOUS

## 2023-12-29 ENCOUNTER — Ambulatory Visit: Payer: Self-pay | Admitting: Family Medicine

## 2023-12-29 NOTE — Progress Notes (Signed)
 Please let him know that the CT of his abdomen and pelvis that was done on 12/26/2023 (I ordered this at his visit in March for abdominal pain) showed gallstones but no acute gallbladder infection or inflammation. No explanation for the abdominal pain he saw me for in March was seen on this study.  The scan showed that his prostate is mildly enlarged.

## 2024-01-06 ENCOUNTER — Encounter: Payer: Self-pay | Admitting: Pharmacist

## 2024-01-06 ENCOUNTER — Other Ambulatory Visit (INDEPENDENT_AMBULATORY_CARE_PROVIDER_SITE_OTHER): Admitting: Pharmacist

## 2024-01-06 DIAGNOSIS — E1165 Type 2 diabetes mellitus with hyperglycemia: Secondary | ICD-10-CM

## 2024-01-06 NOTE — Progress Notes (Unsigned)
   01/08/2024 Name: BOWEN KIA MRN: [id number] DOB: July 02, 1961  Chief Complaint  Patient presents with   Diabetes   Medication Management    SAMEUL TAGLE is a 63 y.o. year old male who presented for a telephone visit.   They were referred to the pharmacist by their PCP for assistance in managing diabetes and hypertension.    Subjective:  Care Team: Primary Care Provider: Lendia Boby CROME, NP-C ; Next Scheduled Visit: 01/09/24 Endocrinologist Shamleffer; Next Scheduled Visit: 01/22/24  Medication Access/Adherence  Current Pharmacy:  [facility] - [facility] [address], [address] [place] [address] Phone: [phone] Fax: [phone]   Patient reports affordability concerns with their medications: Yes  Patient reports access/transportation concerns to their pharmacy: No  Patient reports adherence concerns with their medications:  Yes    Pt has history of nonadherence to medications.     Diabetes:  Current medications: Lantus  20 units daily, Humalog  6 units prior to reach meal, Trulicity  0.75 mg weekly (started 1 week ago) Medications tried in the past: Was previously on Trulicity , however was unable to afford it due to insurance   Pt was out of Lantus  x3 days when out of town  Current glucose readings: Started using [employer] 3    Current meal patterns:  Feels that his BG are often elevated due to his eating habits  Hypertension:  Current medications: amlodipine  10 mg daily, carvedilol  12.5 mg twice daily, hydrochlorothiazide  25 mg daily *He reports he has been doing better taking medications daily  Patient does not have a validated, automated, upper arm home BP cuff Current blood pressure readings: 120/92 on last check  *Requests losartan , hydrochlorothiazide , atorvastatin , carvedilol  refills  Objective:  Lab Results  Component Value Date   HGBA1C 13.0 (A) 09/25/2023    Lab Results  Component  Value Date   CREATININE 1.50 (H) 12/26/2023   BUN 16 09/25/2023   NA 136 09/25/2023   K 4.2 09/25/2023   CL 100 09/25/2023   CO2 28 09/25/2023    Lab Results  Component Value Date   CHOL 136 09/25/2023   HDL 33.20 (L) 09/25/2023   LDLCALC 74 09/25/2023   LDLDIRECT 35.0 01/12/2022   TRIG 144.0 09/25/2023   CHOLHDL 4 09/25/2023    Medications Reviewed Today   Medications were not reviewed in this encounter      Assessment/Plan:   Diabetes: - Currently uncontrolled, A1c goal <7% - Was able to get Freestyle Libre 3 Plus ($40 per 30DS) and Trulicity  ($25 per 30 DS) through insurance at affordable prices  - Recommend increasing Lantus  to 30 units daily and Humalog  to 8 units prior to meals. - Recommend increasing Trulicity  to 1.5 mg weekly   Hypertension: - Currently uncontrolled, BP goal <130/80 - Reviewed long term cardiovascular and renal outcomes of uncontrolled blood pressure - Reviewed appropriate blood pressure monitoring technique and reviewed goal blood pressure. Recommended to check home blood pressure and heart rate  - Recommend to continue current regimen    Follow Up Plan: 7/21  Darrelyn Drum, PharmD, BCPS, CPP Clinical Pharmacist Practitioner [place] [facility] at [facility] [phone]

## 2024-01-08 ENCOUNTER — Other Ambulatory Visit: Payer: Self-pay

## 2024-01-08 MED ORDER — LANTUS SOLOSTAR 100 UNIT/ML ~~LOC~~ SOPN
30.0000 [IU] | PEN_INJECTOR | Freq: Every day | SUBCUTANEOUS | 1 refills | Status: DC
Start: 2024-01-08 — End: 2024-04-27
  Filled 2024-01-08 – 2024-01-31 (×2): qty 15, 50d supply, fill #0

## 2024-01-08 MED ORDER — TRULICITY 1.5 MG/0.5ML ~~LOC~~ SOAJ
1.5000 mg | SUBCUTANEOUS | 1 refills | Status: DC
  Filled 2024-01-08 – 2024-02-24 (×6): qty 2, 28d supply, fill #0

## 2024-01-08 MED ORDER — INSULIN LISPRO (1 UNIT DIAL) 100 UNIT/ML (KWIKPEN)
8.0000 [IU] | PEN_INJECTOR | Freq: Three times a day (TID) | SUBCUTANEOUS | 0 refills | Status: DC
  Filled 2024-01-08 – 2024-01-31 (×2): qty 6, 25d supply, fill #0

## 2024-01-08 NOTE — Patient Instructions (Signed)
 It was a pleasure speaking with you today!  Increase Trulicity  to 1.5 mg weekly   Feel free to call with any questions or concerns!  Darrelyn Drum, PharmD, BCPS, CPP Clinical Pharmacist Practitioner [place] [facility] at [facility] [phone]

## 2024-01-09 ENCOUNTER — Ambulatory Visit (INDEPENDENT_AMBULATORY_CARE_PROVIDER_SITE_OTHER): Admitting: Family Medicine

## 2024-01-09 ENCOUNTER — Ambulatory Visit: Payer: Self-pay | Admitting: Family Medicine

## 2024-01-09 ENCOUNTER — Encounter: Payer: Self-pay | Admitting: Family Medicine

## 2024-01-09 VITALS — BP 128/84 | HR 70 | Temp 97.6°F | Ht 70.0 in | Wt 206.0 lb

## 2024-01-09 DIAGNOSIS — E559 Vitamin D deficiency, unspecified: Secondary | ICD-10-CM | POA: Diagnosis not present

## 2024-01-09 DIAGNOSIS — E1169 Type 2 diabetes mellitus with other specified complication: Secondary | ICD-10-CM | POA: Diagnosis not present

## 2024-01-09 DIAGNOSIS — N400 Enlarged prostate without lower urinary tract symptoms: Secondary | ICD-10-CM | POA: Diagnosis not present

## 2024-01-09 DIAGNOSIS — R350 Frequency of micturition: Secondary | ICD-10-CM

## 2024-01-09 DIAGNOSIS — E785 Hyperlipidemia, unspecified: Secondary | ICD-10-CM

## 2024-01-09 DIAGNOSIS — R809 Proteinuria, unspecified: Secondary | ICD-10-CM

## 2024-01-09 DIAGNOSIS — R3914 Feeling of incomplete bladder emptying: Secondary | ICD-10-CM

## 2024-01-09 DIAGNOSIS — Z794 Long term (current) use of insulin: Secondary | ICD-10-CM

## 2024-01-09 DIAGNOSIS — E538 Deficiency of other specified B group vitamins: Secondary | ICD-10-CM | POA: Diagnosis not present

## 2024-01-09 DIAGNOSIS — I10 Essential (primary) hypertension: Secondary | ICD-10-CM

## 2024-01-09 DIAGNOSIS — Z7985 Long-term (current) use of injectable non-insulin antidiabetic drugs: Secondary | ICD-10-CM

## 2024-01-09 DIAGNOSIS — E1165 Type 2 diabetes mellitus with hyperglycemia: Secondary | ICD-10-CM

## 2024-01-09 DIAGNOSIS — Z1211 Encounter for screening for malignant neoplasm of colon: Secondary | ICD-10-CM

## 2024-01-09 LAB — CBC WITH DIFFERENTIAL/PLATELET
Basophils Absolute: 0 K/uL (ref 0.0–0.1)
Basophils Relative: 0.7 % (ref 0.0–3.0)
Eosinophils Absolute: 0.2 K/uL (ref 0.0–0.7)
Eosinophils Relative: 5.2 % — ABNORMAL HIGH (ref 0.0–5.0)
HCT: 37.4 % — ABNORMAL LOW (ref 39.0–52.0)
Hemoglobin: 12.2 g/dL — ABNORMAL LOW (ref 13.0–17.0)
Lymphocytes Relative: 36.3 % (ref 12.0–46.0)
Lymphs Abs: 1.1 K/uL (ref 0.7–4.0)
MCHC: 32.6 g/dL (ref 30.0–36.0)
MCV: 88.7 fl (ref 78.0–100.0)
Monocytes Absolute: 0.5 K/uL (ref 0.1–1.0)
Monocytes Relative: 16.9 % — ABNORMAL HIGH (ref 3.0–12.0)
Neutro Abs: 1.3 K/uL — ABNORMAL LOW (ref 1.4–7.7)
Neutrophils Relative %: 40.9 % — ABNORMAL LOW (ref 43.0–77.0)
Platelets: 172 K/uL (ref 150.0–400.0)
RBC: 4.21 Mil/uL — ABNORMAL LOW (ref 4.22–5.81)
RDW: 12.8 % (ref 11.5–15.5)
WBC: 3.1 K/uL — ABNORMAL LOW (ref 4.0–10.5)

## 2024-01-09 LAB — LIPID PANEL
Cholesterol: 105 mg/dL (ref 0–200)
HDL: 30.5 mg/dL — ABNORMAL LOW (ref >39.00)
LDL Cholesterol: 45 mg/dL (ref 0–99)
NonHDL: 74.79
Total CHOL/HDL Ratio: 3
Triglycerides: 147 mg/dL (ref 0.0–149.0)
VLDL: 29.4 mg/dL (ref 0.0–40.0)

## 2024-01-09 LAB — COMPREHENSIVE METABOLIC PANEL WITH GFR
ALT: 14 U/L (ref 0–53)
AST: 17 U/L (ref 0–37)
Albumin: 4.1 g/dL (ref 3.5–5.2)
Alkaline Phosphatase: 63 U/L (ref 39–117)
BUN: 19 mg/dL (ref 6–23)
CO2: 29 meq/L (ref 19–32)
Calcium: 9.2 mg/dL (ref 8.4–10.5)
Chloride: 104 meq/L (ref 96–112)
Creatinine, Ser: 1.28 mg/dL (ref 0.40–1.50)
GFR: 59.89 mL/min — ABNORMAL LOW (ref >60.00)
Glucose, Bld: 132 mg/dL — ABNORMAL HIGH (ref 70–99)
Potassium: 4 meq/L (ref 3.5–5.1)
Sodium: 139 meq/L (ref 135–145)
Total Bilirubin: 0.4 mg/dL (ref 0.2–1.2)
Total Protein: 7.8 g/dL (ref 6.0–8.3)

## 2024-01-09 LAB — MICROALBUMIN / CREATININE URINE RATIO
Creatinine,U: 153 mg/dL
Microalb Creat Ratio: 48.9 mg/g — ABNORMAL HIGH (ref 0.0–30.0)
Microalb, Ur: 7.5 mg/dL — ABNORMAL HIGH (ref 0.0–1.9)

## 2024-01-09 LAB — VITAMIN D 25 HYDROXY (VIT D DEFICIENCY, FRACTURES): VITD: 31.4 ng/mL (ref 30.00–100.00)

## 2024-01-09 LAB — TSH: TSH: 2.64 u[IU]/mL (ref 0.35–5.50)

## 2024-01-09 LAB — T4, FREE: Free T4: 0.8 ng/dL (ref 0.60–1.60)

## 2024-01-09 LAB — VITAMIN B12: Vitamin B-12: 413 pg/mL (ref 211–911)

## 2024-01-09 NOTE — Assessment & Plan Note (Addendum)
 Discussed the importance of good medication compliance.  Amlodipine  10 mg, HCTZ 25 mg, losartan  100 mg, carvedilol  12.5 mg recommended.  Encouraged him to monitor blood pressure at home. Encourage low-sodium diet.  Pharmacist Cristy Merceda is assisting him.  He will follow-up here in 3 months   EKG shows sinus rhythm, rate 64, LVH and nonspecific T wave abnormality.

## 2024-01-09 NOTE — Assessment & Plan Note (Signed)
Referral to urology

## 2024-01-09 NOTE — Assessment & Plan Note (Signed)
 Recheck vitamin B12 and replace as needed

## 2024-01-09 NOTE — Assessment & Plan Note (Signed)
 Recheck. Consider referral to urology

## 2024-01-09 NOTE — Assessment & Plan Note (Signed)
 He is wearing a CGM and his BS is much improved. Pharmacist Cristy Merceda is working with him on medications. He will see endocrinology as scheduled.  Overdue for eye exam. Once again, I encouraged him to schedule an eye exam.  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

## 2024-01-09 NOTE — Progress Notes (Signed)
 Subjective:     Patient ID: [name] [name], male    DOB: 20-Dec-1960, 63 y.o.   MRN: [id number]  Chief Complaint  Patient presents with   Medical Management of Chronic Issues    8 week f/u     HPI History of Present Illness         He is here to follow up on chronic health conditions   HTN medications include amlodipine  10 mg daily, losartan  100 mg daily, carvedilol  12.5 mg twice daily, hydrochlorothiazide  25 mg daily States he has been doing better taking medications daily  Taking ASA 325 mg daily  Vitamin D  supplement (?dose)  Sees endocrinology for DM and has an appt this month.  Last A1c 13.0  Wearing CGM, right now his BS is 145 Reports taking Humalog  with meals twice daily 8 units, Lantus  30 units, Trulicity  1.5 mg weekly (on Mondays)   Enlarged prostate on CT with issues emptying bladder  Urinary frequency   Denies feeling depressed, some stress but states he is copying fine  Hx of depression   Smokes cigar occasionally - does not inhale    Health Maintenance Due  Topic Date Due   OPHTHALMOLOGY EXAM  Never done   Colonoscopy  Never done    Past Medical History:  Diagnosis Date   Arthritis    Depression    Diabetes mellitus    Hypertension     Past Surgical History:  Procedure Laterality Date   ANKLE SURGERY  1996   L ankle   CLAVICLE SURGERY  2010   JOINT REPLACEMENT     metal plate in lft knee   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   Procedure: RIGHT TOTAL HIP ARTHROPLASTY ANTERIOR APPROACH;  Surgeon: Lonni CINDERELLA Poli, MD;  Location: [facility];  Service: Orthopedics;  Laterality: Right;   UMBILICAL HERNIA REPAIR  2009    Family History  Problem Relation Age of Onset   Diabetes Mother    Stroke Mother    Cancer Father    Diabetes Sister    Diabetes Sister    Diabetes Sister    Leukemia Maternal Aunt     Social History   Socioeconomic History   Marital status: Married    Spouse name: Not on file   Number of children: Not  on file   Years of education: Not on file   Highest education level: 12th grade  Occupational History   Not on file  Tobacco Use   Smoking status: Some Days    Types: Cigars   Smokeless tobacco: Never   Tobacco comments:    a cigar occasionally  Substance and Sexual Activity   Alcohol use: Yes    Comment: beer - occasionally   Drug use: No   Sexual activity: Not on file  Other Topics Concern   Not on file  Social History Narrative   Not on file   Social Drivers of Health   Financial Resource Strain: Medium Risk (01/05/2024)   Overall Financial Resource Strain (CARDIA)    Difficulty of Paying Living Expenses: Somewhat hard  Food Insecurity: Food Insecurity Present (01/05/2024)   Hunger Vital Sign    Worried About Running Out of Food in the Last Year: Sometimes true    Ran Out of Food in the Last Year: Sometimes true  Transportation Needs: No Transportation Needs (01/05/2024)   PRAPARE - Administrator, Civil Service (Medical): No    Lack of Transportation (Non-Medical): No  Physical Activity: Sufficiently  Active (11/11/2023)   Exercise Vital Sign    Days of Exercise per Week: 6 days    Minutes of Exercise per Session: 50 min  Stress: No Stress Concern Present (11/11/2023)   [employer] of Occupational Health - Occupational Stress Questionnaire    Feeling of Stress : Only a little  Social Connections: Socially Integrated (11/11/2023)   Social Connection and Isolation Panel    Frequency of Communication with Friends and Family: More than three times a week    Frequency of Social Gatherings with Friends and Family: Three times a week    Attends Religious Services: More than 4 times per year    Active Member of Clubs or Organizations: Yes    Attends Banker Meetings: More than 4 times per year    Marital Status: Married  Catering manager Violence: Unknown (10/02/2021)   Received from [facility]   HITS    Physically Hurt: Not on file    Insult or  Talk Down To: Not on file    Threaten Physical Harm: Not on file    Scream or Curse: Not on file    Outpatient Medications Prior to Visit  Medication Sig Dispense Refill   amLODipine  (NORVASC ) 10 MG tablet Take 1 tablet (10 mg total) by mouth daily. 90 tablet 0   Ascorbic Acid (VITAMIN C) 1000 MG tablet Take 1,000 mg by mouth daily.     aspirin  325 MG tablet Take 1 tablet (325 mg total) by mouth daily. 90 tablet 3   atorvastatin  (LIPITOR) 10 MG tablet Take 1 tablet (10 mg total) by mouth daily. 90 tablet 0   carvedilol  (COREG ) 12.5 MG tablet Take 1 tablet (12.5 mg total) by mouth 2 (two) times daily with a meal. 180 tablet 1   Continuous Glucose Sensor (FREESTYLE LIBRE 3 PLUS SENSOR) MISC Change sensor every 15 days. 2 each 5   diclofenac (VOLTAREN) 75 MG EC tablet Take 75 mg by mouth 2 (two) times daily.     Dulaglutide  (TRULICITY ) 1.5 MG/0.5ML SOAJ Inject 1.5 mg into the skin once a week. 2 mL 1   ergocalciferol  (VITAMIN D2) 1.25 MG (50000 UT) capsule Take 1 capsule (50,000 Units total) by mouth once a week. 6 capsule 0   hydrochlorothiazide  (HYDRODIURIL ) 25 MG tablet Take 1 tablet (25 mg total) by mouth daily. 90 tablet 0   insulin  glargine (LANTUS  SOLOSTAR) 100 UNIT/ML Solostar Pen Inject 30 Units into the skin daily. 15 mL 1   insulin  lispro (HUMALOG  KWIKPEN) 100 UNIT/ML KwikPen Inject 8 Units into the skin 3 (three) times daily. 15 mL 0   Insulin  Pen Needle (PEN NEEDLES) 32G X 4 MM MISC Use in the morning, at noon, in the evening, and at bedtime. 400 each 2   Lancet Device MISC May substitute to any manufacturer covered by patient's insurance. 1 each 0   losartan  (COZAAR ) 100 MG tablet Take 1 tablet (100 mg total) by mouth every morning. 90 tablet 0   methocarbamol  (ROBAXIN ) 500 MG tablet Take 1 tablet (500 mg total) by mouth every 6 (six) hours as needed for muscle spasms. 40 tablet 1   pyridoxine (B-6) 100 MG tablet Take 100 mg by mouth daily.     No facility-administered medications  prior to visit.    Allergies  Allergen Reactions   Ozempic (0.25 Or 0.5 Mg-Dose) [Semaglutide(0.25 Or 0.5mg -Dos)] Nausea And Vomiting    Ozempic 2 mg dose    Review of Systems  Constitutional:  Negative for  chills, diaphoresis, fever, malaise/fatigue and weight loss.  Eyes:  Negative for blurred vision, double vision and photophobia.  Respiratory:  Negative for cough and shortness of breath.   Cardiovascular:  Negative for chest pain, palpitations, orthopnea and leg swelling.  Gastrointestinal:  Negative for abdominal pain, constipation, diarrhea, nausea and vomiting.  Genitourinary:  Negative for dysuria, frequency and urgency.  Neurological:  Negative for dizziness, focal weakness and headaches.  Psychiatric/Behavioral:  Negative for depression, substance abuse and suicidal ideas. The patient is not nervous/anxious.        Objective:    Physical Exam Constitutional:      General: He is not in acute distress.    Appearance: He is not ill-appearing.  HENT:     Mouth/Throat:     Mouth: Mucous membranes are moist.     Pharynx: Oropharynx is clear.  Eyes:     Extraocular Movements: Extraocular movements intact.     Conjunctiva/sclera: Conjunctivae normal.  Cardiovascular:     Rate and Rhythm: Normal rate and regular rhythm.  Pulmonary:     Effort: Pulmonary effort is normal.     Breath sounds: Normal breath sounds.  Musculoskeletal:     Cervical back: Normal range of motion and neck supple.     Right lower leg: No edema.     Left lower leg: No edema.  Skin:    General: Skin is warm and dry.  Neurological:     General: No focal deficit present.     Mental Status: He is alert and oriented to person, place, and time.     Cranial Nerves: No cranial nerve deficit.     Motor: No weakness.     Coordination: Coordination normal.     Gait: Gait normal.  Psychiatric:        Mood and Affect: Mood normal.        Behavior: Behavior normal.        Thought Content: Thought  content normal.      BP 128/84 (BP Location: Left Arm, Patient Position: Sitting)   Pulse 70   Temp 97.6 F (36.4 C) (Temporal)   Ht 5' 10 (1.778 m)   Wt 206 lb (93.4 kg)   SpO2 98%   BMI 29.56 kg/m  Wt Readings from Last 3 Encounters:  01/09/24 206 lb (93.4 kg)  11/13/23 211 lb (95.7 kg)  10/14/23 211 lb 12.8 oz (96.1 kg)       Assessment & Plan:   Problem List Items Addressed This Visit     Enlarged prostate   Per CT. Referral to urology      Relevant Orders   Ambulatory referral to Urology   Feeling of incomplete bladder emptying   Referral to urology.       Relevant Orders   Ambulatory referral to Urology   Hyperlipidemia associated with type 2 diabetes mellitus (HCC)   Relevant Orders   Lipid panel (Completed)   Primary hypertension   Discussed the importance of good medication compliance.  Amlodipine  10 mg, HCTZ 25 mg, losartan  100 mg, carvedilol  12.5 mg recommended.  Encouraged him to monitor blood pressure at home. Encourage low-sodium diet.  Pharmacist Cristy Merceda is assisting him.  He will follow-up here in 3 months   EKG shows sinus rhythm, rate 64, LVH and nonspecific T wave abnormality.       Relevant Orders   CBC with Differential/Platelet (Completed)   Comprehensive metabolic panel with GFR (Completed)   TSH (Completed)   T4, free (Completed)  EKG 12-Lead   Uncontrolled type 2 diabetes mellitus with hyperglycemia (HCC) - Primary   He is wearing a CGM and his BS is much improved. Pharmacist Cristy Merceda is working with him on medications. He will see endocrinology as scheduled.  Overdue for eye exam. Once again, I encouraged him to schedule an eye exam.  Urine microalbuminuria- recheck today        Relevant Orders   CBC with Differential/Platelet (Completed)   Comprehensive metabolic panel with GFR (Completed)   TSH (Completed)   T4, free (Completed)   Microalbumin / creatinine urine ratio (Completed)   EKG 12-Lead   Urinary  frequency   Referring to urology for enlarged prostate      Relevant Orders   Ambulatory referral to Urology   Urine test positive for microalbuminuria   Recheck. Consider referral to urology      Relevant Orders   Microalbumin / creatinine urine ratio (Completed)   Vitamin B 12 deficiency   Recheck vitamin B12 and replace as needed       Relevant Orders   Vitamin B12 (Completed)   Vitamin D  deficiency   Check vitamin D  level and follow-up      Relevant Orders   VITAMIN D  25 Hydroxy (Vit-D Deficiency, Fractures) (Completed)   Other Visit Diagnoses       Screen for colon cancer       Relevant Orders   Ambulatory referral to Gastroenterology       I am having Elgin WENDI Fess maintain his methocarbamol , diclofenac, vitamin C, pyridoxine, aspirin , Lancet Device, ergocalciferol , Pen Needles, losartan , hydrochlorothiazide , carvedilol , amLODipine , atorvastatin , FreeStyle Libre 3 Plus Sensor, Trulicity , Lantus  SoloStar, and insulin  lispro.  No orders of the defined types were placed in this encounter.

## 2024-01-09 NOTE — Assessment & Plan Note (Signed)
 Referring to urology for enlarged prostate

## 2024-01-09 NOTE — Assessment & Plan Note (Signed)
Check vitamin D level and follow up 

## 2024-01-09 NOTE — Progress Notes (Signed)
 Needs follow up with me in 6-8 weeks due to anemia and other abnormal labs.

## 2024-01-09 NOTE — Patient Instructions (Addendum)
  Please go downstairs for labs and a urine test before you leave.  Please go to your eye doctor. You are overdue for a diabetic eye exam   I referred you to [facility] and they will call you.  This is for colon cancer screening  You have an appointment with your endocrinologist, diabetes specialist, later this month.

## 2024-01-09 NOTE — Assessment & Plan Note (Signed)
 Per CT. Referral to urology

## 2024-01-16 ENCOUNTER — Other Ambulatory Visit: Payer: Self-pay

## 2024-01-17 ENCOUNTER — Other Ambulatory Visit: Payer: Self-pay

## 2024-01-20 ENCOUNTER — Other Ambulatory Visit (INDEPENDENT_AMBULATORY_CARE_PROVIDER_SITE_OTHER): Admitting: Pharmacist

## 2024-01-20 ENCOUNTER — Other Ambulatory Visit: Payer: Self-pay

## 2024-01-20 DIAGNOSIS — E1165 Type 2 diabetes mellitus with hyperglycemia: Secondary | ICD-10-CM

## 2024-01-20 NOTE — Progress Notes (Unsigned)
 01/20/2024 Name: JEFFRE ENRIQUES MRN: [id number] DOB: 1961/01/26  Chief Complaint  Patient presents with   Diabetes   Medication Management    IASIAH OZMENT is a 63 y.o. year old male who presented for a telephone visit.   They were referred to the pharmacist by their PCP for assistance in managing diabetes and hypertension.    Subjective:  Care Team: Primary Care Provider: Lendia Boby CROME, NP-C ; Next Scheduled Visit: 01/09/24 Endocrinologist Shamleffer; Next Scheduled Visit: 01/22/24  Medication Access/Adherence  Current Pharmacy:  [facility] [address], [address] [place] [address] Phone: [phone] Fax: [phone]   Patient reports affordability concerns with their medications: Yes  Patient reports access/transportation concerns to their pharmacy: No  Patient reports adherence concerns with their medications:  Yes    Pt has history of nonadherence to medications.   Pt notes his meds are ready at the pharmacy, however the total cost is $140 and he is unable to afford. He is currently out of Trulicity .  Diabetes:  Current medications: Lantus  20 units daily, Humalog  6 units prior to reach meal, Trulicity  0.75 mg weekly (started 4 weeks ago) Medications tried in the past: Was previously on Trulicity , however was unable to afford it due to insurance   Current glucose readings: Started using Freestyle Libre 3 - no data since 7/12 due to pt running out of sensors. He is currently unable to afford getting more    Current meal patterns:  Feels that his BG are often elevated due to his eating habits  Hypertension:  Current medications: amlodipine  10 mg daily, carvedilol  12.5 mg twice daily, hydrochlorothiazide  25 mg daily *He reports he has been doing better taking medications daily  Patient does not have a validated, automated, upper arm home BP cuff Current blood pressure readings: 120/92 on last  check    Objective:  Lab Results  Component Value Date   HGBA1C 13.0 (A) 09/25/2023    Lab Results  Component Value Date   CREATININE 1.28 01/09/2024   BUN 19 01/09/2024   NA 139 01/09/2024   K 4.0 01/09/2024   CL 104 01/09/2024   CO2 29 01/09/2024    Lab Results  Component Value Date   CHOL 105 01/09/2024   HDL 30.50 (L) 01/09/2024   LDLCALC 45 01/09/2024   LDLDIRECT 35.0 01/12/2022   TRIG 147.0 01/09/2024   CHOLHDL 3 01/09/2024    Medications Reviewed Today   Medications were not reviewed in this encounter      Assessment/Plan:   Diabetes: - Currently uncontrolled, A1c goal <7% - Was able to get Freestyle Libre 3 Plus ($40 per 30DS) and Trulicity  ($25 per 30 DS) through insurance- will need to contact pharmacy to see what cost is for refills that are ready to see if there are options to reduce the price - Recommend continuing Lantus  30 units daily and Humalog  8 units prior to meals. - Recommend increasing Trulicity  to 1.5 mg weekly - Ask tech to look into med cost and if coupon may be available. Was able to reduce cost to $90 by working with the pharmacy   Hypertension: - Currently uncontrolled, BP goal <130/80 - Reviewed long term cardiovascular and renal outcomes of uncontrolled blood pressure - Reviewed appropriate blood pressure monitoring technique and reviewed goal blood pressure. Recommended to check home blood pressure and heart rate  - Recommend to continue current regimen    Follow Up Plan: 8/11  Cristy  Merceda, PharmD, BCPS, CPP Clinical Pharmacist Practitioner [facility] [phone]

## 2024-01-21 ENCOUNTER — Other Ambulatory Visit: Payer: Self-pay

## 2024-01-21 ENCOUNTER — Telehealth: Payer: Self-pay | Admitting: Pharmacy Technician

## 2024-01-21 NOTE — Progress Notes (Addendum)
   01/21/2024 Name: [name] [name] MRN: [id number] DOB: 06/03/1961  Patient is appearing on a report for [employer] [place] [employer] Diabetes and last engaged with the clinical pharmacist to discuss diabetes on 01/20/2024. Contacted patient today to discuss diabetes management and completed medication review.   Diabetes Plan from last clinical pharmacist appointment:  Diabetes: - Currently uncontrolled, A1c goal <7% - Was able to get Freestyle Libre 3 Plus ($40 per 30DS) and Trulicity  ($25 per 30 DS) through insurance- will need to contact pharmacy to see what cost is for refills that are ready to see if there are options to reduce the price - Recommend continuing Lantus  30 units daily and Humalog  8 units prior to meals. - Recommend increasing Trulicity  to 1.5 mg weekly(copy/paste from last note)   Medication Adherence Barriers Identified: Access issues with any new medication or testing device: Yes Informs he ran out of sensors around 7/12 and has not been able to purchase any more due to the cost. He also ran out of Trulicity  and was unable to afford it due to insurance. Patient is checking blood sugars as prescribed: No   Medication Adherence Barriers Addressed/Actions Taken:  Reviewed medication changes per plan from last clinical pharmacist note Medication Access for FreeStyle Libre and Trulicity  Will discuss medication access concerns with pharmacist Contacted pharmacy regarding new prescriptions/refills Educated patient to contact pharmacy regarding new prescriptions Called the pharmacy and spoke to [name] [name] who informs no medications are currently ready for the patient to pick up. Inqiuired if she could tell me the cost of [employer] and Trulicity . She informs for a month supply the sensors would be $39.99 and the Trulicity  would be $25 with his insurance and NO coupons. If she needs to fill them, then we or patient would need to call back and request the fill. Sent in basket  message back to PharmD. Called pharmacy back based on instruction from PharmD. Requested patient's Losartan , Carvedilol , Atorvastatin , Hydrochlorothiazide , Trulicity  and Libre Sensors be refilled.Representative at the pharmacy was able to get all 6 medications refilled for a cost of $89. She informs it is cheaper to purchase Losartan  off of the insurance. Phone patient and provided this update. He will pick up medications on Friday when he gets paid. Reviewed instructions for monitoring blood sugars at home and reminded patient to keep a written log to review with pharmacist Reminded patient of date/time of upcoming clinical pharmacist follow up and any upcoming PCP/specialists visits. Patient denies transportation barriers to the appointment. No  Next clinical pharmacist appointment is scheduled for: TBD  [name] [name], CPhT [facility] Office: [phone] Email: [name]

## 2024-01-22 ENCOUNTER — Encounter: Payer: Self-pay | Admitting: Internal Medicine

## 2024-01-22 ENCOUNTER — Ambulatory Visit (INDEPENDENT_AMBULATORY_CARE_PROVIDER_SITE_OTHER): Admitting: Internal Medicine

## 2024-01-22 VITALS — BP 158/96 | HR 96 | Ht 70.0 in | Wt 210.0 lb

## 2024-01-22 DIAGNOSIS — Z794 Long term (current) use of insulin: Secondary | ICD-10-CM | POA: Diagnosis not present

## 2024-01-22 DIAGNOSIS — E1142 Type 2 diabetes mellitus with diabetic polyneuropathy: Secondary | ICD-10-CM | POA: Diagnosis not present

## 2024-01-22 DIAGNOSIS — I10 Essential (primary) hypertension: Secondary | ICD-10-CM

## 2024-01-22 DIAGNOSIS — E1165 Type 2 diabetes mellitus with hyperglycemia: Secondary | ICD-10-CM | POA: Insufficient documentation

## 2024-01-22 LAB — POCT GLYCOSYLATED HEMOGLOBIN (HGB A1C): Hemoglobin A1C: 11 % — AB (ref 4.0–5.6)

## 2024-01-22 LAB — POCT GLUCOSE (DEVICE FOR HOME USE): Glucose Fasting, POC: 260 mg/dL — AB (ref 70–99)

## 2024-01-22 NOTE — Patient Instructions (Signed)
 Humalog /Novolog  at 8 units before each meal  Continue Lantus   30 units at night    HOW TO TREAT LOW BLOOD SUGARS (Blood sugar LESS THAN 70 MG/DL) Please follow the RULE OF 15 for the treatment of hypoglycemia treatment (when your (blood sugars are less than 70 mg/dL)   STEP 1: Take 15 grams of carbohydrates when your blood sugar is low, which includes:  3-4 GLUCOSE TABS  OR 3-4 OZ OF JUICE OR REGULAR SODA OR ONE TUBE OF GLUCOSE GEL    STEP 2: RECHECK blood sugar in 15 MINUTES STEP 3: If your blood sugar is still low at the 15 minute recheck --> then, go back to STEP 1 and treat AGAIN with another 15 grams of carbohydrates.

## 2024-01-22 NOTE — Progress Notes (Signed)
 Name: [name] [name]  MRN/ DOB: [id number], [birth year]   Age/ Sex: 63 y.o., male    PCP: Lendia Boby CROME, NP-C   Reason for Endocrinology Evaluation: Type 2 Diabetes Mellitus     Date of Initial Endocrinology Visit: 09/19/2022    PATIENT IDENTIFIER: [name] is a 63 y.o. male with a past medical history of DM, Htn and Dyslipidemia . The patient presented for initial endocrinology clinic visit on 09/19/2022 for consultative assistance with his diabetes management.    HPI: [name] was    Diagnosed with DM 2010 Prior Medications tried/Intolerance: has been on insulin  since 2022 Hemoglobin A1c has ranged from 9.1% in 2023, peaking at 10.5% in 2014.    Has chronic right arm tingling and should pain   The patient has Trulicity  on his medication list, but he tells me he still has Ozempic at home   On his initial visit to our clinic he had an A1c of 9.7%, he was on glipizide , Basaglar , and a GLP-1 agonist.  I have continued Basaglar , increase glipizide  and Trulicity   Started prandial insulin  10/2023 with an A1c of 13.0% and discontinued glipizide   SUBJECTIVE:   During the last visit (10/14/2023): A1c 13.0%      Today (01/22/24): [name] is here for follow-up on diabetes management.  He checks his blood sugars occasionally . The patient has not had hypoglycemic episodes    He has not been taking Trulicity  due to cost despite the pharmacists stating that they were able to get back on Trulicity  and freestyle libre   Has been out of  [employer] has been non-adherent   Denies nausea or vomiting  Had transient loose stools a frew days ago but that has resolved  No fever    HOME DIABETES REGIMEN: Humalog  6 units TIDQAC Trulicity  1.5 mg weekly- not taking  Lantus   30 units daily      Statin: yes ACE-I/ARB: Yes    METER DOWNLOAD SUMMARY: did not bring    DIABETIC COMPLICATIONS: Microvascular complications:  Neuropathy  Denies: CKD Last  eye exam: Completed > 2 yrs  Macrovascular complications:   Denies: CAD, PVD, CVA   PAST HISTORY: Past Medical History:  Past Medical History:  Diagnosis Date   Arthritis    Depression    Diabetes mellitus    Hypertension    Past Surgical History:  Past Surgical History:  Procedure Laterality Date   ANKLE SURGERY  1996   L ankle   CLAVICLE SURGERY  2010   JOINT REPLACEMENT     metal plate in lft knee   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   Procedure: RIGHT TOTAL HIP ARTHROPLASTY ANTERIOR APPROACH;  Surgeon: Lonni CINDERELLA Poli, MD;  Location: [facility];  Service: Orthopedics;  Laterality: Right;   UMBILICAL HERNIA REPAIR  2009    Social History:  reports that he has been smoking cigars. He has never used smokeless tobacco. He reports current alcohol use. He reports that he does not use drugs. Family History:  Family History  Problem Relation Age of Onset   Diabetes Mother    Stroke Mother    Cancer Father    Diabetes Sister    Diabetes Sister    Diabetes Sister    Leukemia Maternal Aunt      HOME MEDICATIONS: Allergies as of 01/22/2024       Reactions   Ozempic (0.25 Or 0.5 Mg-dose) [semaglutide(0.25 Or 0.5mg -dos)] Nausea And Vomiting   Ozempic 2 mg dose  Medication List        Accurate as of January 22, 2024  8:11 AM. If you have any questions, ask your nurse or doctor.          amLODipine  10 MG tablet Commonly known as: NORVASC  Take 1 tablet (10 mg total) by mouth daily.   aspirin  325 MG tablet Take 1 tablet (325 mg total) by mouth daily.   atorvastatin  10 MG tablet Commonly known as: LIPITOR Take 1 tablet (10 mg total) by mouth daily.   BD Pen Needle Nano U/F 32G X 4 MM Misc Generic drug: Insulin  Pen Needle Use in the morning, at noon, in the evening, and at bedtime.   carvedilol  12.5 MG tablet Commonly known as: COREG  Take 1 tablet (12.5 mg total) by mouth 2 (two) times daily with a meal.   diclofenac 75 MG EC tablet Commonly  known as: VOLTAREN Take 75 mg by mouth 2 (two) times daily.   FreeStyle Libre 3 Plus Sensor Misc Change sensor every 15 days.   hydrochlorothiazide  25 MG tablet Commonly known as: HYDRODIURIL  Take 1 tablet (25 mg total) by mouth daily.   insulin  lispro 100 UNIT/ML KwikPen Commonly known as: HumaLOG  KwikPen Inject 8 Units into the skin 3 (three) times daily.   Lancet Device Misc May substitute to any manufacturer covered by patient's insurance.   Lantus  SoloStar 100 UNIT/ML Solostar Pen Generic drug: insulin  glargine Inject 30 Units into the skin daily.   losartan  100 MG tablet Commonly known as: COZAAR  Take 1 tablet (100 mg total) by mouth every morning.   methocarbamol  500 MG tablet Commonly known as: ROBAXIN  Take 1 tablet (500 mg total) by mouth every 6 (six) hours as needed for muscle spasms.   pyridoxine 100 MG tablet Commonly known as: B-6 Take 100 mg by mouth daily.   Trulicity  1.5 MG/0.5ML Soaj Generic drug: Dulaglutide  Inject 1.5 mg into the skin once a week.   vitamin C 1000 MG tablet Take 1,000 mg by mouth daily.   Vitamin D  (Ergocalciferol ) 1.25 MG (50000 UNIT) Caps capsule Commonly known as: DRISDOL  Take 1 capsule (50,000 Units total) by mouth once a week.         ALLERGIES: Allergies  Allergen Reactions   Ozempic (0.25 Or 0.5 Mg-Dose) [Semaglutide(0.25 Or 0.5mg -Dos)] Nausea And Vomiting    Ozempic 2 mg dose     REVIEW OF SYSTEMS: A comprehensive ROS was conducted with the patient and is negative except as per HPI     OBJECTIVE:   VITAL SIGNS: BP (!) 158/96 (BP Location: Left Arm, Patient Position: Sitting, Cuff Size: Normal)   Pulse 96   Ht 5' 10 (1.778 m)   Wt 210 lb (95.3 kg)   SpO2 98%   BMI 30.13 kg/m    PHYSICAL EXAM:  General: Pt appears well and is in NAD  Lungs: Clear with good BS bilat   Heart: RRR   Extremities:  Lower extremities - race edema   Neuro: MS is good with appropriate affect, pt is alert and Ox3    DM  foot exam: 10/14/2023   The skin of the feet is intact without sores or ulcerations. The pedal pulses are 2+ on right and 2+ on left. The sensation is absent to a screening 5.07, 10 gram monofilament at the left great toe   DATA REVIEWED:  Lab Results  Component Value Date   HGBA1C 11.0 (A) 01/22/2024   HGBA1C 13.0 (A) 09/25/2023   HGBA1C 9.3 (A) 12/17/2022  Latest Reference Range & Units 09/25/23 11:59  Sodium 135 - 145 mEq/L 136  Potassium 3.5 - 5.1 mEq/L 4.2  Chloride 96 - 112 mEq/L 100  CO2 19 - 32 mEq/L 28  Glucose 70 - 99 mg/dL 652 (H)  BUN 6 - 23 mg/dL 16  Creatinine 9.59 - 8.49 mg/dL 8.80  Calcium  8.4 - 10.5 mg/dL 9.4  Alkaline Phosphatase 39 - 117 U/L 86  Albumin 3.5 - 5.2 g/dL 4.1  Lipase 88.9 - 40.9 U/L 25.0  AST 0 - 37 U/L 16  ALT 0 - 53 U/L 15  Total Protein 6.0 - 8.3 g/dL 7.6  Bilirubin, Direct 0.0 - 0.3 mg/dL 0.2  Total Bilirubin 0.2 - 1.2 mg/dL 0.8  GFR >39.99 mL/min 65.50    Latest Reference Range & Units 09/25/23 11:59  Total CHOL/HDL Ratio  4  Cholesterol 0 - 200 mg/dL 863  HDL Cholesterol >60.99 mg/dL 66.79 (L)  LDL (calc) 0 - 99 mg/dL 74  NonHDL  896.89  Triglycerides 0.0 - 149.0 mg/dL 855.9  VLDL 0.0 - 59.9 mg/dL 71.1    Latest Reference Range & Units 09/25/23 11:59  TSH 0.35 - 5.50 uIU/mL 1.93  T4,Free(Direct) 0.60 - 1.60 ng/dL 9.06   In office BG 739 mg/dL    ASSESSMENT / PLAN / RECOMMENDATIONS:   1) Type 2 Diabetes Mellitus, Poorly controlled, With neuropathic  complications - Most recent A1c of 11.0 %. Goal A1c < 7.0 %.    - A1c trended down from 13.0% to 11.0%  -Patient with social determinants, he is complaining about the cost for medications, will apply for pt assistance for basal/prandial insulin  . Unfortunately [employer] is NOT accepting new applications for Trulicity    - He has been out of lantus  for 2 days, despite having humlaog , He didn't;t take it yesterday  -Intolerant to [employer] - Not a candidate for pioglitazone due  to lower extremity edema - Discussed importance of taking medications on consistent basis, dicussed risk of microvascular complications   MEDICATIONS: Increase Humalog  10 units with each meal Continue Lantus  30 units daily  EDUCATION / INSTRUCTIONS: BG monitoring instructions: Patient is instructed to check his blood sugars 1 times a day, fasting. Call [facility] if: BG persistently < 70  I reviewed the Rule of 15 for the treatment of hypoglycemia in detail with the patient. Literature supplied.   2) Diabetic complications:  Eye: Does not have known diabetic retinopathy.   Neuro/ Feet: Does have known diabetic peripheral neuropathy. Renal: Patient does not have known baseline CKD. He is  on an ACEI/ARB at present.   3) HTN :  - This is out of control , due to imprefects medications to antihypertensive agents  - He tells me he is out of meds but I showed him that his PCP refilled all 4 agents for 3 months and should have enough till next month - I again encouraged compliance     Follow-up in 3 months   Signed electronically by: Stefano Redgie Butts, MD  [facility]  [facility] [address] [place] [place], [place] [address] Phone: [phone] FAX: [phone]   CC: Lendia Boby CROME, NP-C [address] [place] [address] Phone: [phone]  Fax: [phone]    Return to Endocrinology clinic as below: Future Appointments  Date Time Provider Department Center  02/10/2024 10:00 AM [facility] PHARMACIST [facility] None  03/03/2024  8:20 AM Henson, Vickie L, NP-C [facility] None

## 2024-01-28 ENCOUNTER — Other Ambulatory Visit: Payer: Self-pay

## 2024-01-30 ENCOUNTER — Other Ambulatory Visit: Payer: Self-pay

## 2024-01-31 ENCOUNTER — Other Ambulatory Visit: Payer: Self-pay

## 2024-02-05 ENCOUNTER — Ambulatory Visit: Payer: Self-pay | Admitting: Family Medicine

## 2024-02-05 ENCOUNTER — Encounter: Payer: Self-pay | Admitting: Family Medicine

## 2024-02-05 ENCOUNTER — Ambulatory Visit (INDEPENDENT_AMBULATORY_CARE_PROVIDER_SITE_OTHER): Admitting: Family Medicine

## 2024-02-05 ENCOUNTER — Telehealth: Payer: Self-pay | Admitting: Internal Medicine

## 2024-02-05 ENCOUNTER — Ambulatory Visit (INDEPENDENT_AMBULATORY_CARE_PROVIDER_SITE_OTHER)

## 2024-02-05 VITALS — BP 126/60 | HR 80 | Temp 97.6°F | Ht 70.0 in | Wt 207.0 lb

## 2024-02-05 DIAGNOSIS — M79662 Pain in left lower leg: Secondary | ICD-10-CM | POA: Diagnosis not present

## 2024-02-05 DIAGNOSIS — M7989 Other specified soft tissue disorders: Secondary | ICD-10-CM

## 2024-02-05 DIAGNOSIS — Z23 Encounter for immunization: Secondary | ICD-10-CM | POA: Diagnosis not present

## 2024-02-05 DIAGNOSIS — I8392 Asymptomatic varicose veins of left lower extremity: Secondary | ICD-10-CM

## 2024-02-05 DIAGNOSIS — S8992XA Unspecified injury of left lower leg, initial encounter: Secondary | ICD-10-CM | POA: Diagnosis not present

## 2024-02-05 DIAGNOSIS — S81812A Laceration without foreign body, left lower leg, initial encounter: Secondary | ICD-10-CM

## 2024-02-05 NOTE — Telephone Encounter (Signed)
Patient dropped off patient assistance paperwork.  This was placed in the providers in box at the front desk.  Company is - Community education officer ? ?

## 2024-02-05 NOTE — Progress Notes (Signed)
 Subjective:     Patient ID: [name] [name], male    DOB: [birth year], 63 y.o.   MRN: [id number]  Chief Complaint  Patient presents with   Leg Pain    [place]lemens off a trailer, hit his left knee, happened last Thursday. Lower leg is still swollen as well as knee but not as bad. Tender to touch and can't sleep on it    Leg Pain     Discussed the use of AI scribe software for clinical note transcription with the patient, who gave verbal consent to proceed.  History of Present Illness [name] [name] is a 63 year old male who presents with left knee and leg pain and swelling.  Left lower extremity pain and swelling - Pain and swelling in the left lower leg began six days ago after a fall from a trailer, during which he struck his leg on metal parts - Pain is exacerbated by ambulation - No associated systemic symptoms: no nausea, vomiting, fever, chills, dizziness, chest pain, or shortness of breath  History of left lower extremity trauma - Previous injuries to the same leg, including a forklift accident in the 1980s - Surgical intervention and hardware placement in the 1990s - Two weeks ago, sustained an ankle injury with significant swelling, which has since improved  Immunization status - Uncertain tetanus vaccination status; last tetanus vaccine received in 2018      Health Maintenance Due  Topic Date Due   OPHTHALMOLOGY EXAM  Never done   Colonoscopy  Never done   INFLUENZA VACCINE  01/31/2024    Past Medical History:  Diagnosis Date   Arthritis    Depression    Diabetes mellitus    Hypertension     Past Surgical History:  Procedure Laterality Date   ANKLE SURGERY  1996   L ankle   CLAVICLE SURGERY  2010   JOINT REPLACEMENT     metal plate in lft knee   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   Procedure: RIGHT TOTAL HIP ARTHROPLASTY ANTERIOR APPROACH;  Surgeon: Lonni CINDERELLA Poli, MD;  Location: [facility];  Service: Orthopedics;  Laterality: Right;    UMBILICAL HERNIA REPAIR  2009    Family History  Problem Relation Age of Onset   Diabetes Mother    Stroke Mother    Cancer Father    Diabetes Sister    Diabetes Sister    Diabetes Sister    Leukemia Maternal Aunt     Social History   Socioeconomic History   Marital status: Married    Spouse name: Not on file   Number of children: Not on file   Years of education: Not on file   Highest education level: 12th grade  Occupational History   Not on file  Tobacco Use   Smoking status: Some Days    Types: Cigars   Smokeless tobacco: Never   Tobacco comments:    a cigar occasionally  Substance and Sexual Activity   Alcohol use: Yes    Comment: beer - occasionally   Drug use: No   Sexual activity: Not on file  Other Topics Concern   Not on file  Social History Narrative   Not on file   Social Drivers of Health   Financial Resource Strain: Medium Risk (01/05/2024)   Overall Financial Resource Strain (CARDIA)    Difficulty of Paying Living Expenses: Somewhat hard  Food Insecurity: Food Insecurity Present (01/05/2024)   Hunger Vital Sign    Worried About Running  Out of Food in the Last Year: Sometimes true    Ran Out of Food in the Last Year: Sometimes true  Transportation Needs: No Transportation Needs (01/05/2024)   PRAPARE - Administrator, Civil Service (Medical): No    Lack of Transportation (Non-Medical): No  Physical Activity: Sufficiently Active (11/11/2023)   Exercise Vital Sign    Days of Exercise per Week: 6 days    Minutes of Exercise per Session: 50 min  Stress: No Stress Concern Present (11/11/2023)   [employer] of Occupational Health - Occupational Stress Questionnaire    Feeling of Stress : Only a little  Social Connections: Socially Integrated (11/11/2023)   Social Connection and Isolation Panel    Frequency of Communication with Friends and Family: More than three times a week    Frequency of Social Gatherings with Friends and Family:  Three times a week    Attends Religious Services: More than 4 times per year    Active Member of Clubs or Organizations: Yes    Attends Banker Meetings: More than 4 times per year    Marital Status: Married  Catering manager Violence: Unknown (10/02/2021)   Received from [facility]   HITS    Physically Hurt: Not on file    Insult or Talk Down To: Not on file    Threaten Physical Harm: Not on file    Scream or Curse: Not on file    Outpatient Medications Prior to Visit  Medication Sig Dispense Refill   amLODipine  (NORVASC ) 10 MG tablet Take 1 tablet (10 mg total) by mouth daily. 90 tablet 0   Ascorbic Acid (VITAMIN C) 1000 MG tablet Take 1,000 mg by mouth daily.     aspirin  325 MG tablet Take 1 tablet (325 mg total) by mouth daily. 90 tablet 3   atorvastatin  (LIPITOR) 10 MG tablet Take 1 tablet (10 mg total) by mouth daily. 90 tablet 0   carvedilol  (COREG ) 12.5 MG tablet Take 1 tablet (12.5 mg total) by mouth 2 (two) times daily with a meal. 180 tablet 1   Continuous Glucose Sensor (FREESTYLE LIBRE 3 PLUS SENSOR) MISC Change sensor every 15 days. 2 each 5   diclofenac (VOLTAREN) 75 MG EC tablet Take 75 mg by mouth 2 (two) times daily.     Dulaglutide  (TRULICITY ) 1.5 MG/0.5ML SOAJ Inject 1.5 mg into the skin once a week. 2 mL 1   ergocalciferol  (VITAMIN D2) 1.25 MG (50000 UT) capsule Take 1 capsule (50,000 Units total) by mouth once a week. 6 capsule 0   hydrochlorothiazide  (HYDRODIURIL ) 25 MG tablet Take 1 tablet (25 mg total) by mouth daily. 90 tablet 0   insulin  glargine (LANTUS  SOLOSTAR) 100 UNIT/ML Solostar Pen Inject 30 Units into the skin daily. 15 mL 1   insulin  lispro (HUMALOG  KWIKPEN) 100 UNIT/ML KwikPen Inject 8 Units into the skin 3 (three) times daily. 15 mL 0   Insulin  Pen Needle (PEN NEEDLES) 32G X 4 MM MISC Use in the morning, at noon, in the evening, and at bedtime. 400 each 2   Lancet Device MISC May substitute to any manufacturer covered by patient's  insurance. 1 each 0   losartan  (COZAAR ) 100 MG tablet Take 1 tablet (100 mg total) by mouth every morning. 90 tablet 0   methocarbamol  (ROBAXIN ) 500 MG tablet Take 1 tablet (500 mg total) by mouth every 6 (six) hours as needed for muscle spasms. 40 tablet 1   pyridoxine (B-6) 100 MG tablet  Take 100 mg by mouth daily.     No facility-administered medications prior to visit.    Allergies  Allergen Reactions   Ozempic (0.25 Or 0.5 Mg-Dose) [Semaglutide(0.25 Or 0.5mg -Dos)] Nausea And Vomiting    Ozempic 2 mg dose    Review of Systems  Constitutional:  Negative for chills and fever.  Respiratory:  Negative for shortness of breath.   Cardiovascular:  Negative for chest pain and palpitations.  Gastrointestinal:  Negative for abdominal pain, diarrhea, nausea and vomiting.  Musculoskeletal:  Positive for falls and joint pain.  Neurological:  Negative for dizziness and focal weakness.       Objective:    Physical Exam Constitutional:      General: He is not in acute distress.    Appearance: He is not ill-appearing.  HENT:     Mouth/Throat:     Mouth: Mucous membranes are moist.     Pharynx: Oropharynx is clear.  Eyes:     Extraocular Movements: Extraocular movements intact.     Conjunctiva/sclera: Conjunctivae normal.  Cardiovascular:     Rate and Rhythm: Normal rate.  Pulmonary:     Effort: Pulmonary effort is normal.  Musculoskeletal:     Cervical back: Normal range of motion and neck supple.     Left upper leg: Normal.     Left knee: Normal range of motion.     Left lower leg: Swelling, laceration, tenderness and bony tenderness present.     Left ankle: Swelling present. Normal range of motion. Normal pulse.     Left Achilles Tendon: Normal.     Left foot: Normal range of motion and normal capillary refill. No swelling, tenderness or bony tenderness. Normal pulse.       Legs:     Comments: Swelling and TTP of left anterior lower leg with superficial laceration of upper  anterior lower leg, no bleeding or sign of infection. Calf is without erythema, swelling or TTP.    Skin:    General: Skin is warm and dry.  Neurological:     General: No focal deficit present.     Mental Status: He is alert and oriented to person, place, and time.  Psychiatric:        Mood and Affect: Mood normal.        Behavior: Behavior normal.        Thought Content: Thought content normal.      BP 126/60   Pulse 80   Temp 97.6 F (36.4 C) (Temporal)   Ht 5' 10 (1.778 m)   Wt 207 lb (93.9 kg)   SpO2 100%   BMI 29.70 kg/m  Wt Readings from Last 3 Encounters:  02/05/24 207 lb (93.9 kg)  01/22/24 210 lb (95.3 kg)  01/09/24 206 lb (93.4 kg)       Assessment & Plan:   Problem List Items Addressed This Visit     Varicose veins of left lower leg   Other Visit Diagnoses       Lower leg injury, left, initial encounter    -  Primary   Relevant Orders   DG Tibia/Fibula Left (Completed)   Tdap vaccine greater than or equal to 7yo IM (Completed)     Pain and swelling of left lower leg       Relevant Orders   DG Tibia/Fibula Left (Completed)     Laceration of skin of left lower leg, initial encounter       Relevant Orders   Tdap vaccine greater  than or equal to 7yo IM (Completed)      Assessment and Plan Assessment & Plan History of surgery left ankle Chronic ankle pain due to past injuries, including hardware placement in the 1990s and a forklift injury in the 1980s.  Varicose veins left lower leg Presence of varicose veins in the left lower leg without active bleeding or pain.  Left lower leg contusion with swelling and pain Acute contusion with significant pain and swelling following a fall from a trailer six days ago. Mechanical fall, slipped on hydraulic fluid.  Pain is localized below the knee on the medial aspect. No signs of infection or systemic symptoms. No other injuries.  - Order STAT tib/fib x-ray to rule out fracture.  Tdap updated due to  laceration   I am having Elgin WENDI Fess maintain his methocarbamol , diclofenac, vitamin C, pyridoxine, aspirin , Lancet Device, ergocalciferol , Pen Needles, losartan , hydrochlorothiazide , carvedilol , amLODipine , atorvastatin , FreeStyle Libre 3 Plus Sensor, Trulicity , Lantus  SoloStar, and insulin  lispro.  No orders of the defined types were placed in this encounter.

## 2024-02-05 NOTE — Patient Instructions (Signed)
 Use an ice pack on your leg and elevated for pain relief.  Take Tylenol  as needed.  I will be in touch with your x-ray results.  Let me know if you have any new or worsening symptoms.

## 2024-02-05 NOTE — Progress Notes (Signed)
 Please call him to let him know that his x-ray is negative for fracture or any acute findings.  He can take Tylenol , elevate his leg and use ice packs for pain relief.  If he is not improving gradually over the next 1 to 2 weeks, let me know.

## 2024-02-06 NOTE — Telephone Encounter (Signed)
[employer] patient assistance application has been faxed

## 2024-02-10 ENCOUNTER — Other Ambulatory Visit

## 2024-02-12 ENCOUNTER — Other Ambulatory Visit: Payer: Self-pay | Admitting: Family Medicine

## 2024-02-12 ENCOUNTER — Other Ambulatory Visit: Payer: Self-pay

## 2024-02-12 DIAGNOSIS — I10 Essential (primary) hypertension: Secondary | ICD-10-CM

## 2024-02-12 MED ORDER — AMLODIPINE BESYLATE 10 MG PO TABS
10.0000 mg | ORAL_TABLET | Freq: Every day | ORAL | 1 refills | Status: AC
  Filled 2024-02-12 – 2024-03-23 (×2): qty 90, 90d supply, fill #0

## 2024-02-13 ENCOUNTER — Other Ambulatory Visit: Payer: Self-pay

## 2024-02-18 ENCOUNTER — Other Ambulatory Visit: Payer: Self-pay

## 2024-02-21 ENCOUNTER — Other Ambulatory Visit: Payer: Self-pay

## 2024-02-24 ENCOUNTER — Other Ambulatory Visit: Admitting: Pharmacist

## 2024-02-24 ENCOUNTER — Other Ambulatory Visit: Payer: Self-pay

## 2024-02-24 DIAGNOSIS — E1165 Type 2 diabetes mellitus with hyperglycemia: Secondary | ICD-10-CM

## 2024-02-24 DIAGNOSIS — Z794 Long term (current) use of insulin: Secondary | ICD-10-CM

## 2024-02-24 DIAGNOSIS — I10 Essential (primary) hypertension: Secondary | ICD-10-CM

## 2024-02-24 NOTE — Progress Notes (Signed)
 02/24/2024 Name: [name] [name] MRN: [id number] DOB: [birth year]  Chief Complaint  Patient presents with   Diabetes   Medication Management   Hypertension    KINCAID TIGER is a 63 y.o. year old male who presented for a telephone visit.   They were referred to the pharmacist by their PCP for assistance in managing diabetes and hypertension.   Subjective:  Care Team: Primary Care Provider: Lendia Boby CROME, NP-C ; Next Scheduled Visit: 01/09/24 Endocrinologist Shamleffer; Next Scheduled Visit: 01/22/24  Medication Access/Adherence  Current Pharmacy:  [facility] [address], [address] [place] [address] Phone: [phone] Fax: [phone]   Patient reports affordability concerns with their medications: Yes  Patient reports access/transportation concerns to their pharmacy: No  Patient reports adherence concerns with their medications:  Yes    Pt has history of nonadherence to medications.   Pt notes his meds are ready at the pharmacy, however the total cost is $140 and he is unable to afford. He is currently out of Trulicity .  Diabetes: Trulicity  still out  Current medications: Lantus  30 units daily, Humalog  6 units prior to reach meal Medications tried in the past: Was previously on Trulicity , however was unable to afford it due to insurance   Current glucose readings: Started using Freestyle Libre 3 - no data since 7/12 due to pt running out of sensors. He is currently unable to afford getting more Has not been checking BG    Hypertension:  Current medications: amlodipine  10 mg daily, carvedilol  12.5 mg twice daily, hydrochlorothiazide  25 mg daily *He reports he has been doing better taking medications daily  Patient does not have a validated, automated, upper arm home BP cuff Current blood pressure readings: 120/92 on last check    Objective: BP Readings from Last 3 Encounters:  02/05/24 126/60   01/22/24 (!) 158/96  01/09/24 128/84     Lab Results  Component Value Date   HGBA1C 11.0 (A) 01/22/2024    Lab Results  Component Value Date   CREATININE 1.28 01/09/2024   BUN 19 01/09/2024   NA 139 01/09/2024   K 4.0 01/09/2024   CL 104 01/09/2024   CO2 29 01/09/2024    Lab Results  Component Value Date   CHOL 105 01/09/2024   HDL 30.50 (L) 01/09/2024   LDLCALC 45 01/09/2024   LDLDIRECT 35.0 01/12/2022   TRIG 147.0 01/09/2024   CHOLHDL 3 01/09/2024    Medications Reviewed Today     Reviewed by Merceda Lela SAUNDERS, RPH (Pharmacist) on 02/24/24 at 1543  Med List Status: <None>   Medication Order Taking? Sig Documenting Provider Last Dose Status Informant  amLODipine  (NORVASC ) 10 MG tablet [id number] Yes Take 1 tablet (10 mg total) by mouth daily. Henson, Vickie L, NP-C  Active   Ascorbic Acid (VITAMIN C) 1000 MG tablet [id number]  Take 1,000 mg by mouth daily. [provider]  Active   aspirin  325 MG tablet [id number]  Take 1 tablet (325 mg total) by mouth daily. Henson, Vickie L, NP-C  Active   atorvastatin  (LIPITOR) 10 MG tablet [id number]  Take 1 tablet (10 mg total) by mouth daily. Henson, Vickie L, NP-C  Active   carvedilol  (COREG ) 12.5 MG tablet [id number] Yes Take 1 tablet (12.5 mg total) by mouth 2 (two) times daily with a meal. Henson, Vickie L, NP-C  Active   Continuous Glucose Sensor (FREESTYLE LIBRE 3 PLUS SENSOR) MISC [id number]  Change sensor every 15  days. Lendia Nordmann L, NP-C  Active   diclofenac (VOLTAREN) 75 MG EC tablet [id number]  Take 75 mg by mouth 2 (two) times daily. [provider]  Active            Med Note ZENA, CHERYL A   Mon Aug 13, 2022  9:09 AM) Emily if in pain   Dulaglutide  (TRULICITY ) 1.5 MG/0.5ML SOAJ [id number]  Inject 1.5 mg into the skin once a week.  Patient not taking: Reported on 02/24/2024   Lendia Nordmann L, NP-C  Active   ergocalciferol  (VITAMIN D2) 1.25 MG (50000 UT) capsule [id number]  Take 1 capsule (50,000  Units total) by mouth once a week. Henson, Vickie L, NP-C  Active   hydrochlorothiazide  (HYDRODIURIL ) 25 MG tablet [id number] Yes Take 1 tablet (25 mg total) by mouth daily. Henson, Vickie L, NP-C  Active   insulin  glargine (LANTUS  SOLOSTAR) 100 UNIT/ML Solostar Pen [id number] Yes Inject 30 Units into the skin daily. Henson, Vickie L, NP-C  Active   insulin  lispro (HUMALOG  KWIKPEN) 100 UNIT/ML KwikPen [id number] Yes Inject 8 Units into the skin 3 (three) times daily. Henson, Vickie L, NP-C  Active   Insulin  Pen Needle (PEN NEEDLES) 32G X 4 MM MISC [id number]  Use in the morning, at noon, in the evening, and at bedtime. Shamleffer, Donell Cardinal, MD  Active   Lancet Device MISC [id number]  use as directed Lendia Nordmann CROME, NP-C  Active   losartan  (COZAAR ) 100 MG tablet [id number] Yes Take 1 tablet (100 mg total) by mouth every morning. Henson, Vickie L, NP-C  Active   methocarbamol  (ROBAXIN ) 500 MG tablet [id number]  Take 1 tablet (500 mg total) by mouth every 6 (six) hours as needed for muscle spasms. Gretta Bertrum ORN, PA-C  Active Self  pyridoxine (B-6) 100 MG tablet [id number]  Take 100 mg by mouth daily. [provider]  Active              Assessment/Plan:   Diabetes: - Currently uncontrolled, A1c goal <7% - Will contact pharmacy to refill Trulicity  - was notified 80 DS is $4 since pt now has a medicaid plan - Recommend continuing Lantus  30 units daily and  increase Humalog  to 10 units prior to meals per endo recommendation at last OV   Hypertension: - Currently controlled, BP goal <130/80 - Reviewed long term cardiovascular and renal outcomes of uncontrolled blood pressure - Reviewed appropriate blood pressure monitoring technique and reviewed goal blood pressure. Recommended to check home blood pressure and heart rate  - Recommend to continue current regimen    Follow Up Plan: 9/22  Darrelyn Drum, PharmD, BCPS, CPP Clinical Pharmacist Practitioner [place] [facility] [facility] at  [facility] [phone]

## 2024-02-24 NOTE — Patient Instructions (Signed)
 It was a pleasure speaking with you today!  Restart Trulicity  Continue Lantus  30 units daily and increase Humalog  to 10 units prior to meals per endocrinologist.  Feel free to call with any questions or concerns!  Darrelyn Drum, PharmD, BCPS, CPP Clinical Pharmacist Practitioner [place] [facility] at [facility] [phone]

## 2024-03-03 ENCOUNTER — Ambulatory Visit: Admitting: Family Medicine

## 2024-03-03 ENCOUNTER — Encounter: Payer: Self-pay | Admitting: Family Medicine

## 2024-03-03 VITALS — BP 130/70 | HR 69 | Temp 96.4°F | Wt 206.0 lb

## 2024-03-03 DIAGNOSIS — E785 Hyperlipidemia, unspecified: Secondary | ICD-10-CM

## 2024-03-03 DIAGNOSIS — D709 Neutropenia, unspecified: Secondary | ICD-10-CM | POA: Diagnosis not present

## 2024-03-03 DIAGNOSIS — N183 Chronic kidney disease, stage 3 unspecified: Secondary | ICD-10-CM

## 2024-03-03 DIAGNOSIS — E1169 Type 2 diabetes mellitus with other specified complication: Secondary | ICD-10-CM

## 2024-03-03 DIAGNOSIS — Z794 Long term (current) use of insulin: Secondary | ICD-10-CM

## 2024-03-03 DIAGNOSIS — E1165 Type 2 diabetes mellitus with hyperglycemia: Secondary | ICD-10-CM

## 2024-03-03 DIAGNOSIS — I10 Essential (primary) hypertension: Secondary | ICD-10-CM

## 2024-03-03 DIAGNOSIS — E538 Deficiency of other specified B group vitamins: Secondary | ICD-10-CM | POA: Diagnosis not present

## 2024-03-03 DIAGNOSIS — D649 Anemia, unspecified: Secondary | ICD-10-CM | POA: Diagnosis not present

## 2024-03-03 DIAGNOSIS — Z91148 Patient's other noncompliance with medication regimen for other reason: Secondary | ICD-10-CM

## 2024-03-03 LAB — HEPATIC FUNCTION PANEL
ALT: 15 U/L (ref 0–53)
AST: 16 U/L (ref 0–37)
Albumin: 4.1 g/dL (ref 3.5–5.2)
Alkaline Phosphatase: 70 U/L (ref 39–117)
Bilirubin, Direct: 0.1 mg/dL (ref 0.0–0.3)
Total Bilirubin: 0.5 mg/dL (ref 0.2–1.2)
Total Protein: 7.3 g/dL (ref 6.0–8.3)

## 2024-03-03 LAB — FOLATE: Folate: 15.5 ng/mL (ref >5.9)

## 2024-03-03 LAB — CBC WITH DIFFERENTIAL/PLATELET
Basophils Absolute: 0 K/uL (ref 0.0–0.1)
Basophils Relative: 0.3 % (ref 0.0–3.0)
Eosinophils Absolute: 0.1 K/uL (ref 0.0–0.7)
Eosinophils Relative: 3.9 % (ref 0.0–5.0)
HCT: 36.5 % — ABNORMAL LOW (ref 39.0–52.0)
Hemoglobin: 12 g/dL — ABNORMAL LOW (ref 13.0–17.0)
Lymphocytes Relative: 31.3 % (ref 12.0–46.0)
Lymphs Abs: 1.1 K/uL (ref 0.7–4.0)
MCHC: 32.9 g/dL (ref 30.0–36.0)
MCV: 89.4 fl (ref 78.0–100.0)
Monocytes Absolute: 0.6 K/uL (ref 0.1–1.0)
Monocytes Relative: 17.3 % — ABNORMAL HIGH (ref 3.0–12.0)
Neutro Abs: 1.6 K/uL (ref 1.4–7.7)
Neutrophils Relative %: 47.2 % (ref 43.0–77.0)
Platelets: 133 K/uL — ABNORMAL LOW (ref 150.0–400.0)
RBC: 4.08 Mil/uL — ABNORMAL LOW (ref 4.22–5.81)
RDW: 12.7 % (ref 11.5–15.5)
WBC: 3.4 K/uL — ABNORMAL LOW (ref 4.0–10.5)

## 2024-03-03 LAB — VITAMIN B12: Vitamin B-12: 270 pg/mL (ref 211–911)

## 2024-03-03 LAB — BASIC METABOLIC PANEL WITH GFR
BUN: 20 mg/dL (ref 6–23)
CO2: 28 meq/L (ref 19–32)
Calcium: 9 mg/dL (ref 8.4–10.5)
Chloride: 100 meq/L (ref 96–112)
Creatinine, Ser: 1.23 mg/dL (ref 0.40–1.50)
GFR: 62.76 mL/min (ref >60.00)
Glucose, Bld: 207 mg/dL — ABNORMAL HIGH (ref 70–99)
Potassium: 3.7 meq/L (ref 3.5–5.1)
Sodium: 138 meq/L (ref 135–145)

## 2024-03-03 LAB — FERRITIN: Ferritin: 269.6 ng/mL (ref 22.0–322.0)

## 2024-03-03 NOTE — Patient Instructions (Signed)
 Call and schedule your diabetic eye exam   Call [facility] to schedule for anemia. [phone]

## 2024-03-03 NOTE — Progress Notes (Signed)
 Subjective:     Patient ID: [name] [name], male    DOB: August 31, 1960, 63 y.o.   MRN: [id number]  Chief Complaint  Patient presents with   Follow-up    6 week follow up on abnormal labs. Pt has not completed diabetic eye exam.    HPI  Discussed the use of AI scribe software for clinical note transcription with the patient, who gave verbal consent to proceed.  History of Present Illness [name] [name] is a 63 year old male with anemia and uncontrolled diabetes who presents for follow-up on abnormal lab values.  Anemia evaluation - Following up on anemia with no blood in stool - Iron level was normal in March - Vitamin B12 level was 400 - Folate levels are being checked today - Last colonoscopy performed at [facility], date unknown  Glycemic control - Uncontrolled diabetes with last hemoglobin A1c of 11.0% in July - Was without Trulicity  and Lantus  due to affordability and availability issues while in [place] - Has resumed medications, including Humalog  - Lacks strips for blood glucose monitoring and does not have a [employer] - Diet affected during three weeks in [place], including consumption of salami and homemade items  Renal function - Chronic kidney disease, stage three  Cardiopulmonary and neurologic symptoms - No dizziness, chest pain, palpitations, or shortness of breath - No current pain     Health Maintenance Due  Topic Date Due   OPHTHALMOLOGY EXAM  Never done   Colonoscopy  Never done   INFLUENZA VACCINE  01/31/2024    Past Medical History:  Diagnosis Date   Arthritis    Depression    Diabetes mellitus    Hypertension     Past Surgical History:  Procedure Laterality Date   ANKLE SURGERY  1996   L ankle   CLAVICLE SURGERY  2010   JOINT REPLACEMENT     metal plate in lft knee   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   Procedure: RIGHT TOTAL HIP ARTHROPLASTY ANTERIOR APPROACH;  Surgeon: Lonni CINDERELLA Poli, MD;  Location: [facility];  Service: Orthopedics;  Laterality: Right;   UMBILICAL HERNIA REPAIR  2009    Family History  Problem Relation Age of Onset   Diabetes Mother    Stroke Mother    Cancer Father    Diabetes Sister    Diabetes Sister    Diabetes Sister    Leukemia Maternal Aunt     Social History   Socioeconomic History   Marital status: Married    Spouse name: Not on file   Number of children: Not on file   Years of education: Not on file   Highest education level: 12th grade  Occupational History   Not on file  Tobacco Use   Smoking status: Some Days    Types: Cigars   Smokeless tobacco: Never   Tobacco comments:    a cigar occasionally  Substance and Sexual Activity   Alcohol use: Yes    Comment: beer - occasionally   Drug use: No   Sexual activity: Not on file  Other Topics Concern   Not on file  Social History Narrative   Not on file   Social Drivers of Health   Financial Resource Strain: Medium Risk (01/05/2024)   Overall Financial Resource Strain (CARDIA)    Difficulty of Paying Living Expenses: Somewhat hard  Food Insecurity: Food Insecurity Present (01/05/2024)   Hunger Vital Sign    Worried About Programme researcher, broadcasting/film/video in  the Last Year: Sometimes true    Ran Out of Food in the Last Year: Sometimes true  Transportation Needs: No Transportation Needs (01/05/2024)   PRAPARE - Administrator, Civil Service (Medical): No    Lack of Transportation (Non-Medical): No  Physical Activity: Sufficiently Active (11/11/2023)   Exercise Vital Sign    Days of Exercise per Week: 6 days    Minutes of Exercise per Session: 50 min  Stress: No Stress Concern Present (11/11/2023)   [employer] of Occupational Health - Occupational Stress Questionnaire    Feeling of Stress : Only a little  Social Connections: Socially Integrated (11/11/2023)   Social Connection and Isolation Panel    Frequency of Communication with Friends and Family: More than three times a week    Frequency  of Social Gatherings with Friends and Family: Three times a week    Attends Religious Services: More than 4 times per year    Active Member of Clubs or Organizations: Yes    Attends Banker Meetings: More than 4 times per year    Marital Status: Married  Catering manager Violence: Unknown (10/02/2021)   Received from [facility]   HITS    Physically Hurt: Not on file    Insult or Talk Down To: Not on file    Threaten Physical Harm: Not on file    Scream or Curse: Not on file    Outpatient Medications Prior to Visit  Medication Sig Dispense Refill   amLODipine  (NORVASC ) 10 MG tablet Take 1 tablet (10 mg total) by mouth daily. 90 tablet 1   Ascorbic Acid (VITAMIN C) 1000 MG tablet Take 1,000 mg by mouth daily.     aspirin  325 MG tablet Take 1 tablet (325 mg total) by mouth daily. 90 tablet 3   atorvastatin  (LIPITOR) 10 MG tablet Take 1 tablet (10 mg total) by mouth daily. 90 tablet 0   carvedilol  (COREG ) 12.5 MG tablet Take 1 tablet (12.5 mg total) by mouth 2 (two) times daily with a meal. 180 tablet 1   Continuous Glucose Sensor (FREESTYLE LIBRE 3 PLUS SENSOR) MISC Change sensor every 15 days. 2 each 5   diclofenac (VOLTAREN) 75 MG EC tablet Take 75 mg by mouth 2 (two) times daily.     ergocalciferol  (VITAMIN D2) 1.25 MG (50000 UT) capsule Take 1 capsule (50,000 Units total) by mouth once a week. 6 capsule 0   hydrochlorothiazide  (HYDRODIURIL ) 25 MG tablet Take 1 tablet (25 mg total) by mouth daily. 90 tablet 0   insulin  glargine (LANTUS  SOLOSTAR) 100 UNIT/ML Solostar Pen Inject 30 Units into the skin daily. 15 mL 1   insulin  lispro (HUMALOG  KWIKPEN) 100 UNIT/ML KwikPen Inject 8 Units into the skin 3 (three) times daily. 15 mL 0   Insulin  Pen Needle (PEN NEEDLES) 32G X 4 MM MISC Use in the morning, at noon, in the evening, and at bedtime. 400 each 2   Lancet Device MISC use as directed 1 each 0   losartan  (COZAAR ) 100 MG tablet Take 1 tablet (100 mg total) by mouth every  morning. 90 tablet 0   methocarbamol  (ROBAXIN ) 500 MG tablet Take 1 tablet (500 mg total) by mouth every 6 (six) hours as needed for muscle spasms. 40 tablet 1   pyridoxine (B-6) 100 MG tablet Take 100 mg by mouth daily.     Dulaglutide  (TRULICITY ) 1.5 MG/0.5ML SOAJ Inject 1.5 mg into the skin once a week. (Patient not taking: Reported on  03/03/2024) 2 mL 1   No facility-administered medications prior to visit.    Allergies  Allergen Reactions   Ozempic (0.25 Or 0.5 Mg-Dose) [Semaglutide(0.25 Or 0.5mg -Dos)] Nausea And Vomiting    Ozempic 2 mg dose    ROS Per HPI    Objective:    Physical Exam Constitutional:      General: He is not in acute distress.    Appearance: He is not ill-appearing.  HENT:     Mouth/Throat:     Mouth: Mucous membranes are moist.     Pharynx: Oropharynx is clear.  Eyes:     Extraocular Movements: Extraocular movements intact.     Conjunctiva/sclera: Conjunctivae normal.     Pupils: Pupils are equal, round, and reactive to light.  Cardiovascular:     Rate and Rhythm: Normal rate and regular rhythm.  Pulmonary:     Effort: Pulmonary effort is normal.     Breath sounds: Normal breath sounds.  Musculoskeletal:     Cervical back: Normal range of motion and neck supple. No tenderness.     Right lower leg: No edema.     Left lower leg: No edema.  Lymphadenopathy:     Cervical: No cervical adenopathy.  Skin:    General: Skin is warm and dry.  Neurological:     General: No focal deficit present.     Mental Status: He is alert and oriented to person, place, and time.     Cranial Nerves: No cranial nerve deficit.     Motor: No weakness.     Coordination: Coordination normal.     Gait: Gait normal.  Psychiatric:        Mood and Affect: Mood normal.        Behavior: Behavior normal.        Thought Content: Thought content normal.      BP 130/70   Pulse 69   Temp (!) 96.4 F (35.8 C) (Temporal)   Wt 206 lb (93.4 kg)   SpO2 98%   BMI 29.56 kg/m   Wt Readings from Last 3 Encounters:  03/03/24 206 lb (93.4 kg)  02/05/24 207 lb (93.9 kg)  01/22/24 210 lb (95.3 kg)       Assessment & Plan:   Problem List Items Addressed This Visit     Hyperlipidemia associated with type 2 diabetes mellitus (HCC)   Neutropenia (HCC)   Relevant Orders   CBC with Differential/Platelet (Completed)   Noncompliance with medications   Primary hypertension   Relevant Orders   CBC with Differential/Platelet (Completed)   Basic metabolic panel with GFR (Completed)   Uncontrolled type 2 diabetes mellitus with hyperglycemia (HCC)   Relevant Orders   CBC with Differential/Platelet (Completed)   Basic metabolic panel with GFR (Completed)   Vitamin B 12 deficiency   Relevant Orders   Vitamin B12 (Completed)   Other Visit Diagnoses       Stage 3 chronic kidney disease, unspecified whether stage 3a or 3b CKD (HCC)    -  Primary     Anemia, unspecified type       Relevant Orders   CBC with Differential/Platelet (Completed)   Vitamin B12 (Completed)   Ferritin (Completed)   Folate (Completed)   Hepatic function panel (Completed)   Basic metabolic panel with GFR (Completed)   Fecal occult blood, imunochemical       Assessment and Plan Assessment & Plan Anemia, mild, under evaluation Mild anemia under evaluation with no visible blood in stool. Last  colonoscopy was less than ten years ago at [facility]. Iron levels were normal in March. B12 level was 400, within a decent range. Folate levels not previously checked. Family history of anemia in daughter. - Order iron level test - Order folate level test - Provide stool test kit for at-home testing - Instruct to contact [facility] for follow-up appointment  Type 2 diabetes mellitus, uncontrolled - Managed by endocrinology. Uncontrolled type 2 diabetes mellitus with last A1c in July at 11.0%. Ran out of Trulicity  and daily insulin  for three weeks while in [place]. Currently has all medications  except Freestyle Libre. Needs to monitor blood glucose levels consistently. Emphasized importance of controlling blood glucose to prevent progression of CKD and avoid dialysis. - Follow up with endocrinology as recommended  - Instruct to check with pharmacy about Freestyle Libre availability and affordability - Encourage consistent use of insulin  and monitoring of blood glucose levels - Schedule eye exam for diabetic retinopathy screening  Chronic kidney disease, stage 3 Chronic kidney disease stage 3, likely secondary to uncontrolled diabetes. Emphasized importance of controlling blood glucose to prevent progression to dialysis.  Essential hypertension Essential hypertension. -controlled -Continue current medication regimen       I am having Elgin WENDI Fess maintain his methocarbamol , diclofenac, vitamin C, pyridoxine, aspirin , Lancet Device, ergocalciferol , Pen Needles, losartan , hydrochlorothiazide , carvedilol , atorvastatin , FreeStyle Libre 3 Plus Sensor, Trulicity , Lantus  SoloStar, insulin  lispro, and amLODipine .  No orders of the defined types were placed in this encounter.

## 2024-03-04 ENCOUNTER — Ambulatory Visit: Payer: Self-pay | Admitting: Family Medicine

## 2024-03-04 NOTE — Progress Notes (Signed)
 He is still anemic. Most likely losing blood from GI tract. Please remind him to schedule with [facility] and return the stool test. I also encourage him to take over the counter vitamin B12 1,000 mcg daily.

## 2024-03-06 ENCOUNTER — Other Ambulatory Visit

## 2024-03-06 DIAGNOSIS — D649 Anemia, unspecified: Secondary | ICD-10-CM

## 2024-03-06 LAB — FECAL OCCULT BLOOD, IMMUNOCHEMICAL: Fecal Occult Bld: NEGATIVE

## 2024-03-09 ENCOUNTER — Encounter: Payer: Self-pay | Admitting: Gastroenterology

## 2024-03-09 LAB — HM DIABETES EYE EXAM

## 2024-03-11 ENCOUNTER — Encounter: Payer: Self-pay | Admitting: Family Medicine

## 2024-03-23 ENCOUNTER — Other Ambulatory Visit: Payer: Self-pay

## 2024-03-23 ENCOUNTER — Other Ambulatory Visit (INDEPENDENT_AMBULATORY_CARE_PROVIDER_SITE_OTHER): Admitting: Pharmacist

## 2024-03-23 DIAGNOSIS — I10 Essential (primary) hypertension: Secondary | ICD-10-CM

## 2024-03-23 DIAGNOSIS — Z794 Long term (current) use of insulin: Secondary | ICD-10-CM

## 2024-03-23 DIAGNOSIS — E1165 Type 2 diabetes mellitus with hyperglycemia: Secondary | ICD-10-CM

## 2024-03-23 DIAGNOSIS — Z7985 Long-term (current) use of injectable non-insulin antidiabetic drugs: Secondary | ICD-10-CM

## 2024-03-23 NOTE — Progress Notes (Unsigned)
   03/23/2024 Name: [name] [name] MRN: [id number] DOB: 1960-12-18  No chief complaint on file.   [name] [name] is a 63 y.o. year old male who presented for a telephone visit.   They were referred to the pharmacist by their PCP for assistance in managing diabetes and hypertension.   Subjective:  Care Team: Primary Care Provider: Lendia Boby CROME, NP-C ; Next Scheduled Visit: 01/09/24 Endocrinologist Shamleffer; Next Scheduled Visit: 01/22/24  Medication Access/Adherence  Current Pharmacy:  [facility] - [facility] [address], [address] [place] [address] Phone: [phone] Fax: [phone]   Patient reports affordability concerns with their medications: Yes  Patient reports access/transportation concerns to their pharmacy: No  Patient reports adherence concerns with their medications:  Yes    Pt has history of nonadherence to medications.   Pt notes his meds are ready at the pharmacy, however the total cost is $140 and he is unable to afford. He is currently out of Trulicity .  Diabetes: Trulicity  still out  Current medications: Lantus  30 units daily, Humalog  12 units prior to reach meal, Trulicity  1.5 mg weekly Medications tried in the past: Was previously on Trulicity , however was unable to afford it due to insurance   Current glucose readings: Started using Freestyle Libre 3 - no data since 7/12 due to pt running out of sensors. He is currently unable to afford getting more  150-200, sometimes down to 80    Hypertension:  Current medications: amlodipine  10 mg daily, carvedilol  12.5 mg twice daily, hydrochlorothiazide  25 mg daily *He reports he has been doing better taking medications daily  Patient does not have a validated, automated, upper arm home BP cuff Current blood pressure readings: 120/92 on last check    Objective: BP Readings from Last 3 Encounters:  03/03/24 130/70  02/05/24 126/60  01/22/24 (!)  158/96     Lab Results  Component Value Date   HGBA1C 11.0 (A) 01/22/2024    Lab Results  Component Value Date   CREATININE 1.23 03/03/2024   BUN 20 03/03/2024   NA 138 03/03/2024   K 3.7 03/03/2024   CL 100 03/03/2024   CO2 28 03/03/2024    Lab Results  Component Value Date   CHOL 105 01/09/2024   HDL 30.50 (L) 01/09/2024   LDLCALC 45 01/09/2024   LDLDIRECT 35.0 01/12/2022   TRIG 147.0 01/09/2024   CHOLHDL 3 01/09/2024    Medications Reviewed Today   Medications were not reviewed in this encounter      Assessment/Plan:   Diabetes: - Currently uncontrolled, A1c goal <7% - Will contact pharmacy to refill Trulicity  - was notified 39 DS is $4 since pt now has a medicaid plan - Recommend continuing Lantus  30 units daily and  increase Humalog  to 10 units prior to meals per endo recommendation at last OV   Hypertension: - Currently controlled, BP goal <130/80 - Reviewed long term cardiovascular and renal outcomes of uncontrolled blood pressure - Reviewed appropriate blood pressure monitoring technique and reviewed goal blood pressure. Recommended to check home blood pressure and heart rate  - Recommend to continue current regimen    Follow Up Plan: 9/22  Darrelyn Drum, PharmD, BCPS, CPP Clinical Pharmacist Practitioner [facility] [phone]

## 2024-03-30 ENCOUNTER — Other Ambulatory Visit: Payer: Self-pay

## 2024-04-01 ENCOUNTER — Other Ambulatory Visit: Payer: Self-pay

## 2024-04-13 ENCOUNTER — Other Ambulatory Visit: Admitting: Pharmacist

## 2024-04-13 DIAGNOSIS — I10 Essential (primary) hypertension: Secondary | ICD-10-CM

## 2024-04-13 DIAGNOSIS — E1165 Type 2 diabetes mellitus with hyperglycemia: Secondary | ICD-10-CM

## 2024-04-13 NOTE — Patient Instructions (Signed)
 It was a pleasure speaking with you today!  Be sure to contact the pharmacy to refill amlodipine , Trulicity , and Freestyle Libre sensors. It is important you take your medications regularly as prescribed to keep blood sugars and blood pressure controlled.  Feel free to call with any questions or concerns!  Darrelyn Drum, PharmD, BCPS, CPP Clinical Pharmacist Practitioner [place] [facility] at [facility] [phone]

## 2024-04-13 NOTE — Progress Notes (Signed)
   03/23/2024 Name: [name] [name] MRN: [id number] DOB: [birth year]  Chief Complaint  Patient presents with   Diabetes   Medication Management    [name] [name] is a 63 y.o. year old male who presented for a telephone visit.   They were referred to the pharmacist by their PCP for assistance in managing diabetes and hypertension.   Subjective:  Care Team: Primary Care Provider: Lendia Boby CROME, NP-C ; Next Scheduled Visit: not scheduled Endocrinologist Shamleffer; Next Scheduled Visit: 04/27/24  Medication Access/Adherence  Current Pharmacy:  [facility] [address] [place] [address] Phone: [phone] Fax: [phone]   Patient reports affordability concerns with their medications: Yes  Patient reports access/transportation concerns to their pharmacy: No  Patient reports adherence concerns with their medications:  Yes    Pt has history of nonadherence to medications.  Pt now has Medicaid Pt notes he has not had a chance to go by the pharmacy to pick up his medications. He is overdue for amlodipine , Trulicity , and Freestyle sensors  Diabetes: Current medications: Lantus  30 units daily, Humalog  12 units prior to reach meal, Trulicity  1.5 mg weekly Medications tried in the past: Was previously on Trulicity , however was unable to afford it due to insurance   Current glucose readings: Started using Freestyle Libre 3 - no data since 7/12 due to pt running out of sensors.   Pt reports BG have been up and down   Hypertension:  Current medications: amlodipine  10 mg daily, carvedilol  12.5 mg twice daily, hydrochlorothiazide  25 mg daily *He reports he has been doing better taking medications daily  -Amlodipine  appears overdue for refill  Patient does not have a validated, automated, upper arm home BP cuff Current blood pressure readings: none recent    Objective: BP Readings from Last 3  Encounters:  03/03/24 130/70  02/05/24 126/60  01/22/24 (!) 158/96     Lab Results  Component Value Date   HGBA1C 11.0 (A) 01/22/2024    Lab Results  Component Value Date   CREATININE 1.23 03/03/2024   BUN 20 03/03/2024   NA 138 03/03/2024   K 3.7 03/03/2024   CL 100 03/03/2024   CO2 28 03/03/2024    Lab Results  Component Value Date   CHOL 105 01/09/2024   HDL 30.50 (L) 01/09/2024   LDLCALC 45 01/09/2024   LDLDIRECT 35.0 01/12/2022   TRIG 147.0 01/09/2024   CHOLHDL 3 01/09/2024    Medications Reviewed Today   Medications were not reviewed in this encounter      Assessment/Plan:   Diabetes: - Currently uncontrolled, A1c goal <7%. Main issue is medication adherence - Recommend continuing Lantus  30 units daily, Humalog  to 10 units prior to meals, and Trulicity  - Encouraged adherence to medications and importance of keeping chronic conditions controlled. Pt will need to contact the pharmacy to ask for medication refills prior to picking them up.   Hypertension: - Currently controlled, BP goal <130/80 - Reviewed long term cardiovascular and renal outcomes of uncontrolled blood pressure - Reviewed appropriate blood pressure monitoring technique and reviewed goal blood pressure. Recommended to check home blood pressure and heart rate  - Recommend to continue current regimen  - Reviewed importance of adherence   Follow Up Plan: F/u after 10/27 endo appt  Darrelyn Drum, PharmD, BCPS, CPP Clinical Pharmacist Practitioner [facility] [phone]

## 2024-04-27 ENCOUNTER — Ambulatory Visit (INDEPENDENT_AMBULATORY_CARE_PROVIDER_SITE_OTHER): Admitting: Internal Medicine

## 2024-04-27 ENCOUNTER — Other Ambulatory Visit: Payer: Self-pay

## 2024-04-27 ENCOUNTER — Encounter: Payer: Self-pay | Admitting: Internal Medicine

## 2024-04-27 VITALS — BP 126/80 | HR 70 | Ht 70.0 in | Wt 211.0 lb

## 2024-04-27 DIAGNOSIS — Z794 Long term (current) use of insulin: Secondary | ICD-10-CM

## 2024-04-27 DIAGNOSIS — E1165 Type 2 diabetes mellitus with hyperglycemia: Secondary | ICD-10-CM

## 2024-04-27 DIAGNOSIS — E1142 Type 2 diabetes mellitus with diabetic polyneuropathy: Secondary | ICD-10-CM

## 2024-04-27 LAB — POCT GLYCOSYLATED HEMOGLOBIN (HGB A1C): Hemoglobin A1C: 9.9 % — AB (ref 4.0–5.6)

## 2024-04-27 LAB — POCT GLUCOSE (DEVICE FOR HOME USE): Glucose Fasting, POC: 279 mg/dL — AB (ref 70–99)

## 2024-04-27 MED ORDER — FREESTYLE LIBRE 3 PLUS SENSOR MISC
5 refills | Status: AC
  Filled 2024-04-27 – 2024-04-30 (×2): qty 2, 30d supply, fill #0
  Filled 2024-07-08: qty 2, 30d supply, fill #1

## 2024-04-27 MED ORDER — INSULIN LISPRO (1 UNIT DIAL) 100 UNIT/ML (KWIKPEN)
10.0000 [IU] | PEN_INJECTOR | Freq: Three times a day (TID) | SUBCUTANEOUS | 3 refills | Status: AC
  Filled 2024-04-27: qty 9, 30d supply, fill #0
  Filled 2024-07-08: qty 9, 30d supply, fill #1

## 2024-04-27 MED ORDER — TOUJEO SOLOSTAR 300 UNIT/ML ~~LOC~~ SOPN
24.0000 [IU] | PEN_INJECTOR | Freq: Every day | SUBCUTANEOUS | 3 refills | Status: AC
  Filled 2024-04-27 – 2024-04-30 (×2): qty 4.5, 56d supply, fill #0
  Filled 2024-07-08: qty 4.5, 56d supply, fill #1

## 2024-04-27 MED ORDER — PEN NEEDLES 32G X 4 MM MISC
1.0000 | Freq: Four times a day (QID) | 2 refills | Status: AC
  Filled 2024-04-27: qty 100, 25d supply, fill #0
  Filled 2024-07-08: qty 200, 50d supply, fill #1

## 2024-04-27 MED ORDER — TIRZEPATIDE 5 MG/0.5ML ~~LOC~~ SOAJ
5.0000 mg | SUBCUTANEOUS | 3 refills | Status: AC
  Filled 2024-04-27: qty 2, 28d supply, fill #0
  Filled 2024-06-08: qty 2, 28d supply, fill #1
  Filled 2024-07-08: qty 2, 28d supply, fill #2

## 2024-04-27 NOTE — Patient Instructions (Addendum)
 Take Humalog  10 units before each meal  Will switch Lantus  to Toujeo  24 units once daily Increase Mounjaro 5 mg once weekly   HOW TO TREAT LOW BLOOD SUGARS (Blood sugar LESS THAN 70 MG/DL) Please follow the RULE OF 15 for the treatment of hypoglycemia treatment (when your (blood sugars are less than 70 mg/dL)   STEP 1: Take 15 grams of carbohydrates when your blood sugar is low, which includes:  3-4 GLUCOSE TABS  OR 3-4 OZ OF JUICE OR REGULAR SODA OR ONE TUBE OF GLUCOSE GEL    STEP 2: RECHECK blood sugar in 15 MINUTES STEP 3: If your blood sugar is still low at the 15 minute recheck --> then, go back to STEP 1 and treat AGAIN with another 15 grams of carbohydrates.

## 2024-04-27 NOTE — Progress Notes (Signed)
 Name: [name] [name]  MRN/ DOB: [id number], 1961-03-24   Age/ Sex: 63 y.o., male    PCP: Lendia Boby CROME, NP-C   Reason for Endocrinology Evaluation: Type 2 Diabetes Mellitus     Date of Initial Endocrinology Visit: 09/19/2022    PATIENT IDENTIFIER: [name] is a 63 y.o. male with a past medical history of DM, Htn and Dyslipidemia . The patient presented for initial endocrinology clinic visit on 09/19/2022 for consultative assistance with his diabetes management.    HPI: [name] was    Diagnosed with DM 2010 Prior Medications tried/Intolerance: has been on insulin  since 2022 Hemoglobin A1c has ranged from 9.1% in 2023, peaking at 10.5% in 2014.    Has chronic right arm tingling and should pain   The patient has Trulicity  on his medication list, but he tells me he still has Ozempic at home   On his initial visit to our clinic he had an A1c of 9.7%, he was on glipizide , Basaglar , and a GLP-1 agonist.  I have continued Basaglar , increase glipizide  and Trulicity   Started prandial insulin  10/2023 with an A1c of 13.0% and discontinued glipizide   SUBJECTIVE:   During the last visit (01/22/2024): A1c 11.0%    Today (04/27/24): [name] [name] is here for follow-up on diabetes management.  He checks his blood sugars 1x daily  . He had one episode of hypoglycemia mid day. He is symptomatic   He is accompanied by his spouse today Has occasional nausea but no vomiting  Has occasional constipation   Takes humalog  Breakfast and supper  Patient had on juice last night with his pills He is on Mounjaro through an outside provider   HOME DIABETES REGIMEN: Humalog  10 units TIDQAC- takes 12 units  Lantus   30 units daily - takes 20 units  Mounjaro 2.5 mg weekly     Statin: yes ACE-I/ARB: Yes    METER DOWNLOAD SUMMARY: did not bring    DIABETIC COMPLICATIONS: Microvascular complications:  Neuropathy  Denies: CKD Last eye exam: Completed  03/09/2024  Macrovascular complications:   Denies: CAD, PVD, CVA   PAST HISTORY: Past Medical History:  Past Medical History:  Diagnosis Date   Arthritis    Depression    Diabetes mellitus    Hypertension    Past Surgical History:  Past Surgical History:  Procedure Laterality Date   ANKLE SURGERY  1996   L ankle   CLAVICLE SURGERY  2010   JOINT REPLACEMENT     metal plate in lft knee   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   Procedure: RIGHT TOTAL HIP ARTHROPLASTY ANTERIOR APPROACH;  Surgeon: Lonni CINDERELLA Poli, MD;  Location: [facility];  Service: Orthopedics;  Laterality: Right;   UMBILICAL HERNIA REPAIR  2009    Social History:  reports that he has been smoking cigars. He has never used smokeless tobacco. He reports current alcohol use. He reports that he does not use drugs. Family History:  Family History  Problem Relation Age of Onset   Diabetes Mother    Stroke Mother    Cancer Father    Diabetes Sister    Diabetes Sister    Diabetes Sister    Leukemia Maternal Aunt      HOME MEDICATIONS: Allergies as of 04/27/2024       Reactions   Ozempic (0.25 Or 0.5 Mg-dose) [semaglutide(0.25 Or 0.5mg -dos)] Nausea And Vomiting   Ozempic 2 mg dose        Medication List  Accurate as of April 27, 2024  8:31 AM. If you have any questions, ask your nurse or doctor.          STOP taking these medications    Trulicity  1.5 MG/0.5ML Soaj Generic drug: Dulaglutide  Stopped by: Dejanae Helser J Cheryle Dark       TAKE these medications    amLODipine  10 MG tablet Commonly known as: NORVASC  Take 1 tablet (10 mg total) by mouth daily.   aspirin  325 MG tablet Take 1 tablet (325 mg total) by mouth daily.   atorvastatin  10 MG tablet Commonly known as: LIPITOR Take 1 tablet (10 mg total) by mouth daily.   carvedilol  12.5 MG tablet Commonly known as: COREG  Take 1 tablet (12.5 mg total) by mouth 2 (two) times daily with a meal.   diclofenac 75 MG EC  tablet Commonly known as: VOLTAREN Take 75 mg by mouth 2 (two) times daily.   Embecta Pen Needle Nano 2 Gen 32G X 4 MM Misc Generic drug: Insulin  Pen Needle Use in the morning, at noon, in the evening, and at bedtime.   FreeStyle Libre 3 Plus Sensor Misc Change sensor every 15 days.   hydrochlorothiazide  25 MG tablet Commonly known as: HYDRODIURIL  Take 1 tablet (25 mg total) by mouth daily.   insulin  lispro 100 UNIT/ML KwikPen Commonly known as: HumaLOG  KwikPen Inject 8 Units into the skin 3 (three) times daily.   Lancet Device Misc use as directed   Lantus  SoloStar 100 UNIT/ML Solostar Pen Generic drug: insulin  glargine Inject 30 Units into the skin daily.   losartan  100 MG tablet Commonly known as: COZAAR  Take 1 tablet (100 mg total) by mouth every morning.   methocarbamol  500 MG tablet Commonly known as: ROBAXIN  Take 1 tablet (500 mg total) by mouth every 6 (six) hours as needed for muscle spasms.   Mounjaro 2.5 MG/0.5ML Pen Generic drug: tirzepatide Inject 2.5 mg into the skin once a week.   pyridoxine 100 MG tablet Commonly known as: B-6 Take 100 mg by mouth daily.   vitamin C 1000 MG tablet Take 1,000 mg by mouth daily.   Vitamin D  (Ergocalciferol ) 1.25 MG (50000 UNIT) Caps capsule Commonly known as: DRISDOL  Take 1 capsule (50,000 Units total) by mouth once a week.         ALLERGIES: Allergies  Allergen Reactions   Ozempic (0.25 Or 0.5 Mg-Dose) [Semaglutide(0.25 Or 0.5mg -Dos)] Nausea And Vomiting    Ozempic 2 mg dose     REVIEW OF SYSTEMS: A comprehensive ROS was conducted with the patient and is negative except as per HPI     OBJECTIVE:   VITAL SIGNS: BP 126/80 (BP Location: Left Arm, Patient Position: Sitting, Cuff Size: Normal)   Pulse 70   Ht 5' 10 (1.778 m)   Wt 211 lb (95.7 kg)   SpO2 96%   BMI 30.28 kg/m    PHYSICAL EXAM:  General: Pt appears well and is in NAD  Lungs: Clear with good BS bilat   Heart: RRR   Neuro: MS is  good with appropriate affect, pt is alert and Ox3    DM foot exam: 10/14/2023   The skin of the feet is intact without sores or ulcerations. The pedal pulses are 2+ on right and 2+ on left. The sensation is absent to a screening 5.07, 10 gram monofilament at the left great toe   DATA REVIEWED:  Lab Results  Component Value Date   HGBA1C 9.9 (A) 04/27/2024   HGBA1C 11.0 (A) 01/22/2024  HGBA1C 13.0 (A) 09/25/2023    Latest Reference Range & Units 03/03/24 09:04  Sodium 135 - 145 mEq/L 138  Potassium 3.5 - 5.1 mEq/L 3.7  Chloride 96 - 112 mEq/L 100  CO2 19 - 32 mEq/L 28  Glucose 70 - 99 mg/dL 792 (H)  BUN 6 - 23 mg/dL 20  Creatinine 9.59 - 8.49 mg/dL 8.76  Calcium  8.4 - 10.5 mg/dL 9.0  Alkaline Phosphatase 39 - 117 U/L 70  Albumin 3.5 - 5.2 g/dL 4.1  AST 0 - 37 U/L 16  ALT 0 - 53 U/L 15  Total Protein 6.0 - 8.3 g/dL 7.3  Bilirubin, Direct 0.0 - 0.3 mg/dL 0.1  Total Bilirubin 0.2 - 1.2 mg/dL 0.5  GFR >39.99 mL/min 62.76   In office BG 279 MGs/DL  ASSESSMENT / PLAN / RECOMMENDATIONS:   1) Type 2 Diabetes Mellitus, Poorly controlled, With neuropathic  complications - Most recent A1c of 9.9 %. Goal A1c < 7.0 %.    - A1c trended down from 13.0% to 11.0% and now is 9.9% -Intolerant to [employer] - Not a candidate for pioglitazone due to lower extremity edema - Patient is on Mounjaro, per patient his insurance is covering [place], will increase - Lantus  causes burning sensation, will change and increase the dose - He is on more Humalog  than previously prescribed, will decrease as below - A refill for freestyle herlene has been sent - Patient advised to avoid sugar sweetened beverages  MEDICATIONS: Increase Humalog  10 units with each meal Continue Lantus  30 units daily  EDUCATION / INSTRUCTIONS: BG monitoring instructions: Patient is instructed to check his blood sugars 1 times a day, fasting. Call [facility] if: BG persistently < 70  I reviewed the Rule  of 15 for the treatment of hypoglycemia in detail with the patient. Literature supplied.   2) Diabetic complications:  Eye: Does not have known diabetic retinopathy.   Neuro/ Feet: Does have known diabetic peripheral neuropathy. Renal: Patient does not have known baseline CKD. He is  on an ACEI/ARB at present.    Follow-up in 6 months   Signed electronically by: Stefano Redgie Butts, MD  [facility]  [facility] [address] [place] [place], [place] [address] Phone: [phone] FAX: [phone]   CC: Lendia Boby CROME, NP-C [address] [place] [address] Phone: [phone]  Fax: [phone]    Return to Endocrinology clinic as below: Future Appointments  Date Time Provider Department Center  04/29/2024  8:30 AM May, Deanna J, NP [facility]

## 2024-04-28 ENCOUNTER — Other Ambulatory Visit: Payer: Self-pay

## 2024-04-28 ENCOUNTER — Telehealth: Payer: Self-pay | Admitting: Pharmacy Technician

## 2024-04-28 NOTE — Telephone Encounter (Signed)
 Pharmacy Patient Advocate Encounter   Received notification from CoverMyMeds that prior authorization for FreeStyle Libre 3 Plus Sensor  is required/requested.   Insurance verification completed.   The patient is insured through [facility] MEDICAID.   Per test claim: PA required; PA submitted to above mentioned insurance via Fax Key/confirmation #/EOC [id number] Status is pending

## 2024-04-28 NOTE — Telephone Encounter (Signed)
 Pharmacy Patient Advocate Encounter   Received notification from CoverMyMeds that prior authorization for Toujeo  SoloStar 300UNIT/ML pen-injectors is required/requested.   Insurance verification completed.   The patient is insured through [facility] MEDICAID.   Per test claim: PA required; PA submitted to above mentioned insurance via [facility] Tracks Key/confirmation #/EOC [id number] F Status is pending

## 2024-04-28 NOTE — Telephone Encounter (Signed)
 Pharmacy Patient Advocate Encounter   Received notification from CoverMyMeds that prior authorization for Mounjaro 5MG /0.5ML auto-injectors  is required/requested.   Insurance verification completed.   The patient is insured through [facility] MEDICAID.   Per test claim: PA required; PA submitted to above mentioned insurance via Latent Key/confirmation #/EOC [id number] Status is pending   **May need to send a second PA to secondary insurance [place] once approved.**

## 2024-04-29 ENCOUNTER — Other Ambulatory Visit: Payer: Self-pay

## 2024-04-29 ENCOUNTER — Telehealth: Payer: Self-pay | Admitting: Pharmacy Technician

## 2024-04-29 ENCOUNTER — Ambulatory Visit (INDEPENDENT_AMBULATORY_CARE_PROVIDER_SITE_OTHER): Admitting: Gastroenterology

## 2024-04-29 ENCOUNTER — Other Ambulatory Visit (HOSPITAL_COMMUNITY): Payer: Self-pay

## 2024-04-29 ENCOUNTER — Encounter: Payer: Self-pay | Admitting: Gastroenterology

## 2024-04-29 VITALS — BP 120/90 | HR 68 | Ht 69.0 in | Wt 211.4 lb

## 2024-04-29 DIAGNOSIS — K8020 Calculus of gallbladder without cholecystitis without obstruction: Secondary | ICD-10-CM

## 2024-04-29 DIAGNOSIS — Z1211 Encounter for screening for malignant neoplasm of colon: Secondary | ICD-10-CM | POA: Diagnosis not present

## 2024-04-29 DIAGNOSIS — D649 Anemia, unspecified: Secondary | ICD-10-CM

## 2024-04-29 DIAGNOSIS — K5730 Diverticulosis of large intestine without perforation or abscess without bleeding: Secondary | ICD-10-CM

## 2024-04-29 DIAGNOSIS — K219 Gastro-esophageal reflux disease without esophagitis: Secondary | ICD-10-CM | POA: Diagnosis not present

## 2024-04-29 DIAGNOSIS — R142 Eructation: Secondary | ICD-10-CM

## 2024-04-29 MED ORDER — NA SULFATE-K SULFATE-MG SULF 17.5-3.13-1.6 GM/177ML PO SOLN
1.0000 | Freq: Once | ORAL | 0 refills | Status: AC
  Filled 2024-04-29: qty 354, 2d supply, fill #0

## 2024-04-29 NOTE — Progress Notes (Signed)
 Chief Complaint:Screen for colon cancer , anemia  Primary GI Doctor: Dr. Charlanne   HPI:  Patient is a  63  year old male patient with past medical history of DM, hypertension, who was referred to me by Lendia Boby CROME, NP-C on 01/09/24 for a evaluation of Screen for colon cancer  .    Interval History  Patient presents for evaluation of colon screening colonoscopy and discuss anemia. Accompanied by his wife.  Patient has history of anemia, unknown etiology. Not vegan. Does not donate blood. Denies blood in stool. Denies hematuria. Denies hematemesis.   Patient has occasional heartburn if he eats certain foods. Denies dysphagia. He notes increased reflux over the last month, recently started on Mounjaro. He has also had odorous burps.   He has occasional nausea. No vomiting. He reports it was worse when he was on Ozempic.   Patient has regular BM 1-2 every day. No blood in stool.   Patient smokes cigars occasionally. Drinks a beer twice a month.   Not on any blood thinners.  Never had EGD.  Surgical history: umbilical hernia  Patient's family history includes: great GM leukemia, aunt leukemia  Wt Readings from Last 3 Encounters:  04/29/24 211 lb 6 oz (95.9 kg)  04/27/24 211 lb (95.7 kg)  03/03/24 206 lb (93.4 kg)    Past Medical History:  Diagnosis Date   Anxiety    Arthritis    Depression    Diabetes mellitus    Hypertension    Liver disease     Past Surgical History:  Procedure Laterality Date   ANKLE SURGERY  07/02/1994   L ankle   CLAVICLE SURGERY Left 07/02/2008   JOINT REPLACEMENT     metal plate in lft knee   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   Procedure: RIGHT TOTAL HIP ARTHROPLASTY ANTERIOR APPROACH;  Surgeon: Lonni CINDERELLA Poli, MD;  Location: [facility];  Service: Orthopedics;  Laterality: Right;   UMBILICAL HERNIA REPAIR  07/03/2007    Current Outpatient Medications  Medication Sig Dispense Refill   amLODipine  (NORVASC ) 10 MG tablet Take 1  tablet (10 mg total) by mouth daily. 90 tablet 1   Ascorbic Acid (VITAMIN C) 1000 MG tablet Take 1,000 mg by mouth daily.     atorvastatin  (LIPITOR) 10 MG tablet Take 1 tablet (10 mg total) by mouth daily. 90 tablet 0   carvedilol  (COREG ) 12.5 MG tablet Take 1 tablet (12.5 mg total) by mouth 2 (two) times daily with a meal. 180 tablet 1   Continuous Glucose Sensor (FREESTYLE LIBRE 3 PLUS SENSOR) MISC Change sensor every 15 days. 2 each 5   diclofenac (VOLTAREN) 75 MG EC tablet Take 75 mg by mouth 2 (two) times daily.     ergocalciferol  (VITAMIN D2) 1.25 MG (50000 UT) capsule Take 1 capsule (50,000 Units total) by mouth once a week. 6 capsule 0   hydrochlorothiazide  (HYDRODIURIL ) 25 MG tablet Take 1 tablet (25 mg total) by mouth daily. 90 tablet 0   insulin  glargine, 1 Unit Dial , (TOUJEO  SOLOSTAR) 300 UNIT/ML Solostar Pen Inject 24 Units into the skin daily in the afternoon. 30 mL 3   insulin  lispro (HUMALOG  KWIKPEN) 100 UNIT/ML KwikPen Inject 10 Units into the skin 3 (three) times daily. 30 mL 3   Insulin  Pen Needle (PEN NEEDLES) 32G X 4 MM MISC Use in the morning, at noon, in the evening, and at bedtime. 400 each 2   Lancet Device MISC use as directed 1 each 0  losartan  (COZAAR ) 100 MG tablet Take 1 tablet (100 mg total) by mouth every morning. 90 tablet 0   methocarbamol  (ROBAXIN ) 500 MG tablet Take 1 tablet (500 mg total) by mouth every 6 (six) hours as needed for muscle spasms. 40 tablet 1   Na Sulfate-K Sulfate-Mg Sulfate concentrate (SUPREP) 17.5-3.13-1.6 GM/177ML SOLN Take 1 kit (354 mLs total) by mouth once for 1 dose. 354 mL 0   pyridoxine (B-6) 100 MG tablet Take 100 mg by mouth daily.     tirzepatide (MOUNJARO) 5 MG/0.5ML Pen Inject 5 mg into the skin once a week. 6 mL 3   No current facility-administered medications for this visit.    Allergies as of 04/29/2024 - Review Complete 04/29/2024  Allergen Reaction Noted   Ozempic (0.25 or 0.5 mg-dose) [semaglutide(0.25 or 0.5mg -dos)]  Nausea And Vomiting 09/20/2022    Family History  Problem Relation Age of Onset   Diabetes Mother    Stroke Mother    Hypertension Mother    Diabetes Sister    Diabetes Sister    Diabetes Sister    Colon cancer Maternal Grandmother    Anemia Daughter    Lupus Daughter    Post-traumatic stress disorder Son    Leukemia Maternal Aunt     Review of Systems:    Constitutional: No weight loss, fever, chills, weakness or fatigue HEENT: Eyes: No change in vision               Ears, Nose, Throat:  No change in hearing or congestion Skin: No rash or itching Cardiovascular: No chest pain, chest pressure or palpitations   Respiratory: No SOB or cough Gastrointestinal: See HPI and otherwise negative Genitourinary: No dysuria or change in urinary frequency Neurological: No headache, dizziness or syncope Musculoskeletal: No new muscle or joint pain Hematologic: No bleeding or bruising Psychiatric: No history of depression or anxiety    Physical Exam:  Vital signs: BP (!) 120/90 (BP Location: Left Arm, Patient Position: Sitting, Cuff Size: Normal)   Pulse 68   Ht 5' 9 (1.753 m) Comment: height measured without shoes  Wt 211 lb 6 oz (95.9 kg)   BMI 31.21 kg/m   Constitutional: Pleasant male appears to be in NAD, Well developed, Well nourished, alert and cooperative Throat: Oral cavity and pharynx without inflammation, swelling or lesion.  Respiratory: Respirations even and unlabored. Lungs clear to auscultation bilaterally.   No wheezes, crackles, or rhonchi.  Cardiovascular: Normal S1, S2. Regular rate and rhythm. No peripheral edema, cyanosis or pallor.  Gastrointestinal:  Soft, nondistended, nontender. No rebound or guarding. Normal bowel sounds. No appreciable masses or hepatomegaly. Rectal:  Not performed.  Msk:  Symmetrical without gross deformities. Without edema, no deformity or joint abnormality.  Neurologic:  Alert and  oriented x4;  grossly normal neurologically.  Skin:    Dry and intact without significant lesions or rashes.  RELEVANT LABS AND IMAGING: CBC    Latest Ref Rng & Units 03/03/2024    9:04 AM 01/09/2024    9:18 AM 09/25/2023   11:59 AM  CBC  WBC 4.0 - 10.5 K/uL 3.4  3.1  3.1   Hemoglobin 13.0 - 17.0 g/dL 87.9  87.7  86.8   Hematocrit 39.0 - 52.0 % 36.5  37.4  39.8   Platelets 150.0 - 400.0 K/uL 133.0  172.0  145.0      CMP     Latest Ref Rng & Units 03/03/2024    9:04 AM 01/09/2024    9:18 AM  12/26/2023    4:12 PM  CMP  Glucose 70 - 99 mg/dL 792  867    BUN 6 - 23 mg/dL 20  19    Creatinine 9.59 - 1.50 mg/dL 8.76  8.71  8.49   Sodium 135 - 145 mEq/L 138  139    Potassium 3.5 - 5.1 mEq/L 3.7  4.0    Chloride 96 - 112 mEq/L 100  104    CO2 19 - 32 mEq/L 28  29    Calcium  8.4 - 10.5 mg/dL 9.0  9.2    Total Protein 6.0 - 8.3 g/dL 7.3  7.8    Total Bilirubin 0.2 - 1.2 mg/dL 0.5  0.4    Alkaline Phos 39 - 117 U/L 70  63    AST 0 - 37 U/L 16  17    ALT 0 - 53 U/L 15  14       Lab Results  Component Value Date   TSH 2.64 01/09/2024  12/2023 B 12 413, vitmain D 31.40, ferritin 269.6, folate 15.5  03/06/24 fecal occult negative 12/2015 colonoscopy -normal per patient First colonoscopy approximately 42-30 years old and normal   12/26/23 CTAP IMPRESSION: No acute findings. Cholelithiasis. No radiographic evidence of cholecystitis. Colonic diverticulosis, without radiographic evidence of diverticulitis. Mildly enlarged prostate.    Assessment: Encounter Diagnoses  Name Primary?   Anemia, unspecified type Yes   Special screening for malignant neoplasms, colon    Gastroesophageal reflux disease, unspecified whether esophagitis present    Belching    Diverticulosis of colon without hemorrhage    Calculus of gallbladder without cholecystitis without obstruction     63 year old male patient who presents for evaluation for colon screening colonoscopy and discuss anemia of unknown etiology.  Patient has had intermittent anemia that dates back  as far as February 2010. Last colon 2017 and per patient normal. Never had EGD.  Baseline 12-13.  Hemoglobin stable currently at 12.  Negative fecal occult.  Normal ferritin level.  B12 413.  Folate 15.5.  Recent CTAP with findings of cholelithiasis and colonic diverticulosis.  Patient denies any RUQ pain.  Patient has had some mild nausea with increased reflux and belching since starting Mounjaro.  We did discuss that those are possible side effects of the medication and patient can use over-the-counter Pepcid as needed.  Patient denies any overt bleeding.  Will go ahead and proceed with upper GI endoscopy and colonoscopy to evaluate.  If negative workup Brookie Wayment consider small capsule.  CT scan also showed diverticulosis and recommended high-fiber diet.  Patient denies any altered bowel habits.  Plan: - OTC pepcid prn  - Recommend GERD diet, no late meals -recommend high fiber diet -Schedule EGD in [facility] with Dr. Guota. The risks and benefits of EGD with possible biopsies and esophageal dilation were discussed with the patient who agrees to proceed. -Schedule for a colonoscopy in [facility] with Dr. Charlanne. The risks and benefits of colonoscopy with possible polypectomy / biopsies were discussed and the patient agrees to proceed.  -hold Mounjaro 1 week prior (Sundays) -request records of colonoscopy from [facility] 2017   Thank you for the courtesy of this consult. Please call me with any questions or concerns.   Georgi Tuel, FNP-C [facility] 04/29/2024, 10:18 AM  Cc: Henson, Vickie L, NP-C

## 2024-04-29 NOTE — Telephone Encounter (Signed)
 Pharmacy Patient Advocate Encounter  Received notification from [place] MEDICAID that Prior Authorization for MOUNJARO 5 MG/0.5ML  has been APPROVED from 04-28-2024 to 04-28-2025   PA #/Case ID/Reference #: [id number]

## 2024-04-29 NOTE — Telephone Encounter (Signed)
 Pharmacy Patient Advocate Encounter  Received notification from [facility] MEDICAID that Prior Authorization for Mounjaro 5MG /0.5ML auto-injectors  has been APPROVED from 04/28/24 to 04/28/25. Ran test claim, Copay is $25.00. This test claim was processed through [facility]- copay amounts may vary at other pharmacies due to pharmacy/plan contracts, or as the patient moves through the different stages of their insurance plan.   PA #/Case ID/Reference #: [id number]

## 2024-04-29 NOTE — Telephone Encounter (Signed)
 Pharmacy Patient Advocate Encounter   Received notification from CoverMyMeds that prior authorization for MOUNJARO 5 MG/0.5ML  is required/requested.   Insurance verification completed.   The patient is insured through [facility] MEDICAID.   Per test claim: PA required; PA submitted to above mentioned insurance via [facility] Tracks Key/confirmation #/EOC [id number] W Status is pending

## 2024-04-29 NOTE — Telephone Encounter (Signed)
 Faxed received via medicaid. Currently pending. Confirmation #: [id number]

## 2024-04-29 NOTE — Telephone Encounter (Deleted)
 Pharmacy Patient Advocate Encounter  Received notification from  MEDICAID that Prior Authorization for Toujeo  SoloStar 300UNIT/ML pen-injectors  has been APPROVED from 04/29/24 to 04/29/25   PA #/Case ID/Reference #: [id number]  **Accidentally hit the Void button on [employer]. Called them and the agent said that its still approved and no action is needed.**

## 2024-04-29 NOTE — Patient Instructions (Addendum)
 GERD Recommend GERD diet Can use otc pepcid as needed  Diverticulosis Recommend high fiber diet  We have sent the following medications to your pharmacy for you to pick up at your convenience: SUPREP  You have been scheduled for an endoscopy and colonoscopy. Please follow the written instructions given to you at your visit today.  If you use inhalers (even only as needed), please bring them with you on the day of your procedure.  DO NOT TAKE 7 DAYS PRIOR TO TEST- Trulicity  (dulaglutide ) Ozempic, Wegovy (semaglutide) Mounjaro (tirzepatide) Bydureon Bcise (exanatide extended release)  DO NOT TAKE 1 DAY PRIOR TO YOUR TEST Rybelsus (semaglutide) Adlyxin (lixisenatide) Victoza (liraglutide) Byetta (exanatide) ___________________________________________________________________________  Due to recent changes in healthcare laws, you may see the results of your imaging and laboratory studies on MyChart before your provider has had a chance to review them.  We understand that in some cases there may be results that are confusing or concerning to you. Not all laboratory results come back in the same time frame and the provider may be waiting for multiple results in order to interpret others.  Please give us  48 hours in order for your provider to thoroughly review all the results before contacting the office for clarification of your results.   _______________________________________________________  If your blood pressure at your visit was 140/90 or greater, please contact your primary care physician to follow up on this.  _______________________________________________________  If you are age 77 or older, your body mass index should be between 23-30. Your Body mass index is 31.21 kg/m. If this is out of the aforementioned range listed, please consider follow up with your Primary Care Provider.  If you are age 28 or younger, your body mass index should be between 19-25. Your Body mass index  is 31.21 kg/m. If this is out of the aformentioned range listed, please consider follow up with your Primary Care Provider.   ________________________________________________________  The [facility] providers would like to encourage you to use MYCHART to communicate with providers for non-urgent requests or questions.  Due to long hold times on the telephone, sending your provider a message by [facility] may be a faster and more efficient way to get a response.  Please allow 48 business hours for a response.  Please remember that this is for non-urgent requests.  _______________________________________________________  [facility] is using a team-based approach to care.  Your team is made up of your doctor and two to three APPS. Our APPS (Nurse Practitioners and Physician Assistants) work with your physician to ensure care continuity for you. They are fully qualified to address your health concerns and develop a treatment plan. They communicate directly with your gastroenterologist to care for you. Seeing the Advanced Practice Practitioners on your physician's team can help you by facilitating care more promptly, often allowing for earlier appointments, access to diagnostic testing, procedures, and other specialty referrals.   Thank you for trusting me with your gastrointestinal care. Deanna May, FNP-C

## 2024-04-30 ENCOUNTER — Other Ambulatory Visit: Payer: Self-pay

## 2024-04-30 NOTE — Telephone Encounter (Signed)
 Pharmacy Patient Advocate Encounter  Received notification from [place] MEDICAID that Prior Authorization for Toujeo  SoloStar 300UNIT/ML pen-injectors  has been APPROVED from 04/29/24 to 04/29/25. Ran test claim, Copay is $4.00. This test claim was processed through [facility]- copay amounts may vary at other pharmacies due to pharmacy/plan contracts, or as the patient moves through the different stages of their insurance plan.

## 2024-04-30 NOTE — Telephone Encounter (Signed)
 Pharmacy Patient Advocate Encounter  Received notification from [place] MEDICAID that Prior Authorization for FreeStyle Libre 3 Plus Sensor  has been APPROVED from 04/29/24 to 04/24/25   PA #/Case ID/Reference #: [id number]

## 2024-05-05 ENCOUNTER — Other Ambulatory Visit: Payer: Self-pay

## 2024-06-08 ENCOUNTER — Other Ambulatory Visit: Payer: Self-pay

## 2024-06-08 ENCOUNTER — Other Ambulatory Visit: Payer: Self-pay | Admitting: Family Medicine

## 2024-06-08 DIAGNOSIS — I10 Essential (primary) hypertension: Secondary | ICD-10-CM

## 2024-06-08 MED ORDER — LOSARTAN POTASSIUM 100 MG PO TABS
100.0000 mg | ORAL_TABLET | Freq: Every morning | ORAL | 0 refills | Status: AC
  Filled 2024-06-08 – 2024-07-28 (×3): qty 90, 90d supply, fill #0

## 2024-06-10 ENCOUNTER — Ambulatory Visit: Admitting: Gastroenterology

## 2024-06-10 ENCOUNTER — Other Ambulatory Visit: Payer: Self-pay | Admitting: Gastroenterology

## 2024-06-10 ENCOUNTER — Encounter: Payer: Self-pay | Admitting: Gastroenterology

## 2024-06-10 VITALS — BP 141/97 | HR 68 | Temp 97.2°F | Resp 15 | Ht 69.0 in | Wt 211.0 lb

## 2024-06-10 DIAGNOSIS — Z1211 Encounter for screening for malignant neoplasm of colon: Secondary | ICD-10-CM | POA: Diagnosis present

## 2024-06-10 DIAGNOSIS — K624 Stenosis of anus and rectum: Secondary | ICD-10-CM

## 2024-06-10 DIAGNOSIS — D509 Iron deficiency anemia, unspecified: Secondary | ICD-10-CM | POA: Diagnosis not present

## 2024-06-10 DIAGNOSIS — D649 Anemia, unspecified: Secondary | ICD-10-CM

## 2024-06-10 DIAGNOSIS — K5989 Other specified functional intestinal disorders: Secondary | ICD-10-CM

## 2024-06-10 DIAGNOSIS — K5730 Diverticulosis of large intestine without perforation or abscess without bleeding: Secondary | ICD-10-CM | POA: Diagnosis not present

## 2024-06-10 DIAGNOSIS — K3189 Other diseases of stomach and duodenum: Secondary | ICD-10-CM

## 2024-06-10 DIAGNOSIS — Q439 Congenital malformation of intestine, unspecified: Secondary | ICD-10-CM

## 2024-06-10 DIAGNOSIS — K640 First degree hemorrhoids: Secondary | ICD-10-CM | POA: Diagnosis not present

## 2024-06-10 DIAGNOSIS — K449 Diaphragmatic hernia without obstruction or gangrene: Secondary | ICD-10-CM

## 2024-06-10 MED ORDER — SODIUM CHLORIDE 0.9 % IV SOLN: 500.0000 mL | INTRAVENOUS | Status: DC

## 2024-06-10 NOTE — Progress Notes (Signed)
 Called to room to assist during endoscopic procedure.  Patient ID and intended procedure confirmed with present staff. Received instructions for my participation in the procedure from the performing physician.

## 2024-06-10 NOTE — Progress Notes (Signed)
 Chief Complaint:Screen for colon cancer , anemia  Primary GI Doctor: Dr. Charlanne    HPI:  Patient is a  63  year old male patient with past medical history of DM, hypertension, who was referred to me by Lendia Boby CROME, NP-C on 01/09/24 for a evaluation of Screen for colon cancer  .     Interval History   Patient presents for evaluation of colon screening colonoscopy and discuss anemia. Accompanied by his wife.   Patient has history of anemia, unknown etiology. Not vegan. Does not donate blood. Denies blood in stool. Denies hematuria. Denies hematemesis.    Patient has occasional heartburn if he eats certain foods. Denies dysphagia. He notes increased reflux over the last month, recently started on Mounjaro . He has also had odorous burps.    He has occasional nausea. No vomiting. He reports it was worse when he was on Ozempic.    Patient has regular BM 1-2 every day. No blood in stool.    Patient smokes cigars occasionally. Drinks a beer twice a month.    Not on any blood thinners.   Never had EGD.   Surgical history: umbilical hernia   Patient's family history includes: great GM leukemia, aunt leukemia      Wt Readings from Last 3 Encounters:  04/29/24 211 lb 6 oz (95.9 kg)  04/27/24 211 lb (95.7 kg)  03/03/24 206 lb (93.4 kg)        Past Medical History:  Diagnosis Date   Anxiety     Arthritis     Depression     Diabetes mellitus     Hypertension     Liver disease                 Past Surgical History:  Procedure Laterality Date   ANKLE SURGERY   07/02/1994    L ankle   CLAVICLE SURGERY Left 07/02/2008   JOINT REPLACEMENT        metal plate in lft knee   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    Procedure: RIGHT TOTAL HIP ARTHROPLASTY ANTERIOR APPROACH;  Surgeon: Lonni CINDERELLA Poli, MD;  Location: [facility];  Service: Orthopedics;  Laterality: Right;   UMBILICAL HERNIA REPAIR   07/03/2007                Current Outpatient Medications  Medication  Sig Dispense Refill   amLODipine  (NORVASC ) 10 MG tablet Take 1 tablet (10 mg total) by mouth daily. 90 tablet 1   Ascorbic Acid (VITAMIN C) 1000 MG tablet Take 1,000 mg by mouth daily.       atorvastatin  (LIPITOR) 10 MG tablet Take 1 tablet (10 mg total) by mouth daily. 90 tablet 0   carvedilol  (COREG ) 12.5 MG tablet Take 1 tablet (12.5 mg total) by mouth 2 (two) times daily with a meal. 180 tablet 1   Continuous Glucose Sensor (FREESTYLE LIBRE 3 PLUS SENSOR) MISC Change sensor every 15 days. 2 each 5   diclofenac (VOLTAREN) 75 MG EC tablet Take 75 mg by mouth 2 (two) times daily.       ergocalciferol  (VITAMIN D2) 1.25 MG (50000 UT) capsule Take 1 capsule (50,000 Units total) by mouth once a week. 6 capsule 0   hydrochlorothiazide  (HYDRODIURIL ) 25 MG tablet Take 1 tablet (25 mg total) by mouth daily. 90 tablet 0   insulin  glargine, 1 Unit Dial , (TOUJEO  SOLOSTAR) 300 UNIT/ML Solostar Pen Inject 24 Units into the skin daily in the afternoon. 30 mL 3  insulin  lispro (HUMALOG  KWIKPEN) 100 UNIT/ML KwikPen Inject 10 Units into the skin 3 (three) times daily. 30 mL 3   Insulin  Pen Needle (PEN NEEDLES) 32G X 4 MM MISC Use in the morning, at noon, in the evening, and at bedtime. 400 each 2   Lancet Device MISC use as directed 1 each 0   losartan  (COZAAR ) 100 MG tablet Take 1 tablet (100 mg total) by mouth every morning. 90 tablet 0   methocarbamol  (ROBAXIN ) 500 MG tablet Take 1 tablet (500 mg total) by mouth every 6 (six) hours as needed for muscle spasms. 40 tablet 1   Na Sulfate-K Sulfate-Mg Sulfate concentrate (SUPREP) 17.5-3.13-1.6 GM/177ML SOLN Take 1 kit (354 mLs total) by mouth once for 1 dose. 354 mL 0   pyridoxine (B-6) 100 MG tablet Take 100 mg by mouth daily.       tirzepatide  (MOUNJARO ) 5 MG/0.5ML Pen Inject 5 mg into the skin once a week. 6 mL 3      No current facility-administered medications for this visit.             Allergies as of 04/29/2024 - Review Complete 04/29/2024   Allergen Reaction Noted   Ozempic (0.25 or 0.5 mg-dose) [semaglutide(0.25 or 0.5mg -dos)] Nausea And Vomiting 09/20/2022           Family History  Problem Relation Age of Onset   Diabetes Mother     Stroke Mother     Hypertension Mother     Diabetes Sister     Diabetes Sister     Diabetes Sister     Colon cancer Maternal Grandmother     Anemia Daughter     Lupus Daughter     Post-traumatic stress disorder Son     Leukemia Maternal Aunt            Review of Systems:    Constitutional: No weight loss, fever, chills, weakness or fatigue HEENT: Eyes: No change in vision               Ears, Nose, Throat:  No change in hearing or congestion Skin: No rash or itching Cardiovascular: No chest pain, chest pressure or palpitations   Respiratory: No SOB or cough Gastrointestinal: See HPI and otherwise negative Genitourinary: No dysuria or change in urinary frequency Neurological: No headache, dizziness or syncope Musculoskeletal: No new muscle or joint pain Hematologic: No bleeding or bruising Psychiatric: No history of depression or anxiety      Physical Exam:  Vital signs: BP (!) 120/90 (BP Location: Left Arm, Patient Position: Sitting, Cuff Size: Normal)   Pulse 68   Ht 5' 9 (1.753 m) Comment: height measured without shoes  Wt 211 lb 6 oz (95.9 kg)   BMI 31.21 kg/m    Constitutional: Pleasant male appears to be in NAD, Well developed, Well nourished, alert and cooperative Throat: Oral cavity and pharynx without inflammation, swelling or lesion.  Respiratory: Respirations even and unlabored. Lungs clear to auscultation bilaterally.   No wheezes, crackles, or rhonchi.  Cardiovascular: Normal S1, S2. Regular rate and rhythm. No peripheral edema, cyanosis or pallor.  Gastrointestinal:  Soft, nondistended, nontender. No rebound or guarding. Normal bowel sounds. No appreciable masses or hepatomegaly. Rectal:  Not performed.  Msk:  Symmetrical without gross deformities. Without  edema, no deformity or joint abnormality.  Neurologic:  Alert and  oriented x4;  grossly normal neurologically.  Skin:   Dry and intact without significant lesions or rashes.   RELEVANT LABS AND IMAGING:  CBC     Latest Ref Rng & Units 03/03/2024    9:04 AM 01/09/2024    9:18 AM 09/25/2023   11:59 AM  CBC  WBC 4.0 - 10.5 K/uL 3.4  3.1  3.1   Hemoglobin 13.0 - 17.0 g/dL 87.9  87.7  86.8   Hematocrit 39.0 - 52.0 % 36.5  37.4  39.8   Platelets 150.0 - 400.0 K/uL 133.0  172.0  145.0       CMP         Latest Ref Rng & Units 03/03/2024    9:04 AM 01/09/2024    9:18 AM 12/26/2023    4:12 PM  CMP  Glucose 70 - 99 mg/dL 792  867     BUN 6 - 23 mg/dL 20  19     Creatinine 9.59 - 1.50 mg/dL 8.76  8.71  8.49   Sodium 135 - 145 mEq/L 138  139     Potassium 3.5 - 5.1 mEq/L 3.7  4.0     Chloride 96 - 112 mEq/L 100  104     CO2 19 - 32 mEq/L 28  29     Calcium  8.4 - 10.5 mg/dL 9.0  9.2     Total Protein 6.0 - 8.3 g/dL 7.3  7.8     Total Bilirubin 0.2 - 1.2 mg/dL 0.5  0.4     Alkaline Phos 39 - 117 U/L 70  63     AST 0 - 37 U/L 16  17     ALT 0 - 53 U/L 15  14         Recent Labs       Lab Results  Component Value Date    TSH 2.64 01/09/2024    12/2023 B 12 413, vitmain D 31.40, ferritin 269.6, folate 15.5  03/06/24 fecal occult negative 12/2015 colonoscopy -normal per patient First colonoscopy approximately 34-45 years old and normal    12/26/23 CTAP IMPRESSION: No acute findings. Cholelithiasis. No radiographic evidence of cholecystitis. Colonic diverticulosis, without radiographic evidence of diverticulitis. Mildly enlarged prostate.     Assessment:     Encounter Diagnoses  Name Primary?   Anemia, unspecified type Yes   Special screening for malignant neoplasms, colon     Gastroesophageal reflux disease, unspecified whether esophagitis present     Belching     Diverticulosis of colon without hemorrhage     Calculus of gallbladder without cholecystitis without obstruction       63 year old male patient who presents for evaluation for colon screening colonoscopy and discuss anemia of unknown etiology.  Patient has had intermittent anemia that dates back as far as February 2010. Last colon 2017 and per patient normal. Never had EGD.  Baseline 12-13.  Hemoglobin stable currently at 12.  Negative fecal occult.  Normal ferritin level.  B12 413.  Folate 15.5.  Recent CTAP with findings of cholelithiasis and colonic diverticulosis.  Patient denies any RUQ pain.  Patient has had some mild nausea with increased reflux and belching since starting Mounjaro .  We did discuss that those are possible side effects of the medication and patient can use over-the-counter Pepcid as needed.  Patient denies any overt bleeding.  Will go ahead and proceed with upper GI endoscopy and colonoscopy to evaluate.  If negative workup may consider small capsule.  CT scan also showed diverticulosis and recommended high-fiber diet.  Patient denies any altered bowel habits.   Plan: - OTC pepcid prn  - Recommend GERD diet,  no late meals -recommend high fiber diet -Schedule EGD in [facility] with Dr. Guota. The risks and benefits of EGD with possible biopsies and esophageal dilation were discussed with the patient who agrees to proceed. -Schedule for a colonoscopy in [facility] with Dr. Charlanne. The risks and benefits of colonoscopy with possible polypectomy / biopsies were discussed and the patient agrees to proceed.  -hold Mounjaro  1 week prior (Sundays) -request records of colonoscopy from [facility] 2017     Thank you for the courtesy of this consult. Please call me with any questions or concerns.    Deanna May, FNP-C [facility]     Attending physician's note   I have taken history, reviewed the chart and examined the patient. I performed a substantive portion of this encounter, including complete performance of at least one of the key components, in conjunction with the APP. I agree with the Advanced  Practitioner's note, impression and recommendations.   For EGD/colon today Needs hematology consult therafter for pancytopenia. CT AP neg for cirrhosis, heme neg   Anselm Charlanne, MD [facility] [phone]

## 2024-06-10 NOTE — Op Note (Signed)
 [facility] Patient Name: [name] [name] Procedure Date: 06/10/2024 8:12 AM MRN: [id number] Endoscopist: Lynnie Bring , MD, [id number] Age: 63 Referring MD:  Date of Birth: Feb 16, 1961 Gender: Male Account #: [id number] Procedure:                Upper GI endoscopy Indications:              Iron deficiency anemia with heme-negative stools.                            Negative CT Abdo/pelvis for any etiology. GERD. Medicines:                Monitored Anesthesia Care Procedure:                Pre-Anesthesia Assessment:                           - Prior to the procedure, a History and Physical                            was performed, and patient medications and                            allergies were reviewed. The patient's tolerance of                            previous anesthesia was also reviewed. The risks                            and benefits of the procedure and the sedation                            options and risks were discussed with the patient.                            All questions were answered, and informed consent                            was obtained. Prior Anticoagulants: The patient has                            taken no anticoagulant or antiplatelet agents. ASA                            Grade Assessment: II - A patient with mild systemic                            disease. After reviewing the risks and benefits,                            the patient was deemed in satisfactory condition to                            undergo the procedure.  After obtaining informed consent, the endoscope was                            passed under direct vision. Throughout the                            procedure, the patient's blood pressure, pulse, and                            oxygen saturations were monitored continuously. The                            Olympus scope [id number] was introduced through the                            mouth,  and advanced to the second part of duodenum.                            The upper GI endoscopy was accomplished without                            difficulty. The patient tolerated the procedure                            well. Scope In: Scope Out: Findings:                 The examined esophagus was normal.                           A 2 cm hiatal hernia was present extending from 35                            up to 37 cm (GE junction up to diaphragmatic                            hiatus).                           The entire examined stomach was J-shaped and                            patulous but normal.                           The examined duodenum was normal. Biopsies for                            histology were taken with a cold forceps for                            evaluation of celiac disease. Complications:            No immediate complications. Estimated Blood Loss:     Estimated blood loss: none. Impression:               -  2 cm hiatal hernia.                           - Otherwise normal EGD. Recommendation:           - Patient has a contact number available for                            emergencies. The signs and symptoms of potential                            delayed complications were discussed with the                            patient. Return to normal activities tomorrow.                            Written discharge instructions were provided to the                            patient.                           - Resume previous diet.                           - Continue present medications including Pepcid.                           - Proceed with colonoscopy.                           - The findings and recommendations were discussed                            with the patient's family. Lynnie Bring, MD 06/10/2024 8:58:13 AM This report has been signed electronically.

## 2024-06-10 NOTE — Op Note (Signed)
 [facility] Patient Name: [name] [name] Procedure Date: 06/10/2024 8:11 AM MRN: [id number] Endoscopist: Lynnie Bring , MD, [id number] Age: 63 Referring MD:  Date of Birth: August 27, 1960 Gender: Male Account #: [id number] Procedure:                Colonoscopy Indications:              Unexplained iron deficiency anemia with                            heme-negative stools. Negative CT Abdo/pelvis for                            any etiology of anemia. Medicines:                Monitored Anesthesia Care Procedure:                Pre-Anesthesia Assessment:                           - Prior to the procedure, a History and Physical                            was performed, and patient medications and                            allergies were reviewed. The patient's tolerance of                            previous anesthesia was also reviewed. The risks                            and benefits of the procedure and the sedation                            options and risks were discussed with the patient.                            All questions were answered, and informed consent                            was obtained. Prior Anticoagulants: The patient has                            taken no anticoagulant or antiplatelet agents. ASA                            Grade Assessment: II - A patient with mild systemic                            disease. After reviewing the risks and benefits,                            the patient was deemed in satisfactory condition to  undergo the procedure.                           After obtaining informed consent, the colonoscope                            was passed under direct vision. Throughout the                            procedure, the patient's blood pressure, pulse, and                            oxygen saturations were monitored continuously. The                            CF [id number] #[id number] was introduced through the  anus                            and advanced to the 2 cm into the ileum. The                            colonoscopy was performed without difficulty. The                            patient tolerated the procedure well. The quality                            of the bowel preparation was good. The terminal                            ileum, ileocecal valve, appendiceal orifice, and                            rectum were photographed. Scope In: 8:31:28 AM Scope Out: 8:55:16 AM Scope Withdrawal Time: 0 hours 15 minutes 11 seconds  Total Procedure Duration: 0 hours 23 minutes 48 seconds  Findings:                 Multiple medium-mouthed diverticula were found in                            the sigmoid colon, transverse colon and ascending                            colon.                           Non-bleeding internal hemorrhoids were found during                            retroflexion. The hemorrhoids were Grade I                            (internal hemorrhoids that do not prolapse). Rectal  exam also revealed minor degree of rectal stenosis.                            No obvious lesions were noted.                           The terminal ileum appeared normal.                           The exam was otherwise without abnormality on                            direct and retroflexion views. The colon was highly                            torturous and atonic. Complications:            No immediate complications. Estimated Blood Loss:     Estimated blood loss: none. Impression:               - Moderate pancolonic diverticulosis.                           - Non-bleeding internal hemorrhoids.                           - The examined portion of the ileum was normal.                            Atonic and torturous colon.                           - The examination was otherwise normal on direct                            and retroflexion views.                            - No specimens collected. Recommendation:           - Patient has a contact number available for                            emergencies. The signs and symptoms of potential                            delayed complications were discussed with the                            patient. Return to normal activities tomorrow.                            Written discharge instructions were provided to the                            patient.                           -  High fiber diet. If constipated, start MiraLAX  17                            g p.o. daily                           - Continue present medications.                           - Repeat colonoscopy in 10 years for screening                            purposes. Earlier, with any new problems or change                            in family history.                           - The findings and recommendations were discussed                            with the patient's family.                           - Recommend hematology consultation since patient                            has pancytopenia. Lynnie Bring, MD 06/10/2024 9:02:57 AM This report has been signed electronically.

## 2024-06-10 NOTE — Progress Notes (Signed)
 A/o x 3, VSS, good SR's, pleased with anesthesia, report to RN

## 2024-06-10 NOTE — Patient Instructions (Signed)
-  Await pathology results -Handout on hemorrhoids and diverticulosis provided -If constipated, start MiraLax  17g by mouth daily  YOU HAD AN ENDOSCOPIC PROCEDURE TODAY AT THE [facility]:   Refer to the procedure report that was given to you for any specific questions about what was found during the examination.  If the procedure report does not answer your questions, please call your gastroenterologist to clarify.  If you requested that your care partner not be given the details of your procedure findings, then the procedure report has been included in a sealed envelope for you to review at your convenience later.  YOU SHOULD EXPECT: Some feelings of bloating in the abdomen. Passage of more gas than usual.  Walking can help get rid of the air that was put into your GI tract during the procedure and reduce the bloating. If you had a lower endoscopy (such as a colonoscopy or flexible sigmoidoscopy) you may notice spotting of blood in your stool or on the toilet paper. If you underwent a bowel prep for your procedure, you may not have a normal bowel movement for a few days.  Please Note:  You might notice some irritation and congestion in your nose or some drainage.  This is from the oxygen used during your procedure.  There is no need for concern and it should clear up in a day or so.  SYMPTOMS TO REPORT IMMEDIATELY:  Following lower endoscopy (colonoscopy or flexible sigmoidoscopy):  Excessive amounts of blood in the stool  Significant tenderness or worsening of abdominal pains  Swelling of the abdomen that is new, acute  Fever of 100F or higher  Following upper endoscopy (EGD)  Vomiting of blood or coffee ground material  New chest pain or pain under the shoulder blades  Painful or persistently difficult swallowing  New shortness of breath  Fever of 100F or higher  Black, tarry-looking stools  For urgent or emergent issues, a gastroenterologist can be reached at any hour by  calling [phone]. Do not use MyChart messaging for urgent concerns.    DIET:  We do recommend a small meal at first, but then you may proceed to your regular diet.  Drink plenty of fluids but you should avoid alcoholic beverages for 24 hours.  ACTIVITY:  You should plan to take it easy for the rest of today and you should NOT DRIVE or use heavy machinery until tomorrow (because of the sedation medicines used during the test).    FOLLOW UP: Our staff will call the number listed on your records the next business day following your procedure.  We will call around 7:15- 8:00 am to check on you and address any questions or concerns that you may have regarding the information given to you following your procedure. If we do not reach you, we will leave a message.     If any biopsies were taken you will be contacted by phone or by letter within the next 1-3 weeks.  Please call us  at [phone] if you have not heard about the biopsies in 3 weeks.    SIGNATURES/CONFIDENTIALITY: You and/or your care partner have signed paperwork which will be entered into your electronic medical record.  These signatures attest to the fact that that the information above on your After Visit Summary has been reviewed and is understood.  Full responsibility of the confidentiality of this discharge information lies with you and/or your care-partner.

## 2024-06-11 ENCOUNTER — Telehealth: Payer: Self-pay | Admitting: Lactation Services

## 2024-06-11 NOTE — Telephone Encounter (Signed)
 Patient returning call stating he is feeling just fine, a bit sore but other than that he's fine  Please advise  Thank you

## 2024-06-11 NOTE — Telephone Encounter (Signed)
 No answer left voice mail

## 2024-06-12 LAB — SURGICAL PATHOLOGY

## 2024-06-13 ENCOUNTER — Ambulatory Visit: Payer: Self-pay | Admitting: Gastroenterology

## 2024-06-17 ENCOUNTER — Other Ambulatory Visit: Payer: Self-pay

## 2024-06-30 ENCOUNTER — Ambulatory Visit: Payer: Self-pay

## 2024-06-30 NOTE — Telephone Encounter (Signed)
 FYI Only or Action Required?: FYI only for provider: ED advised.  Patient was last seen in primary care on 03/03/2024 by Lendia Boby CROME, NP-C.  Called Nurse Triage reporting Fall and Loss of Consciousness.  Symptoms began today.  Interventions attempted: Nothing.  Symptoms are: stable.  Triage Disposition: Go to ED Now (Notify PCP)  Patient/caregiver understands and will follow disposition?: Yes         Copied from CRM [id number]. Topic: Clinical - Red Word Triage >> Jun 30, 2024  7:50 AM [name] [name] wrote: Red Word that prompted transfer to Nurse Triage: Patient called in stating that he fell yesterday twice due to fainting. He said he fainted once while on the toilet. He fainted another time after getting off the toilet, hit his face and busted his lip. Patient is concerned that he may need stitches and is hoping for a same day appointment.   CB: [phone] Reason for Disposition  Any head or face injury  Answer Assessment - Initial Assessment Questions 1. ONSET: How long were you unconscious? (e.g., minutes, seconds) When did it happen?     3am this morning.  2. CONTENT: What happened during the period of unconsciousness? (e.g., seizure activity)      Patient describes first incident as falling asleep and the second as passing out.  3. MENTAL STATUS: Alert and oriented now? (e.g., oriented x 3 = name, month, location)      Alert and oriented now.  4. TRIGGER: What do you think caused the fainting? What were you doing just before you fainted?  (e.g., exercise, sudden standing up, prolonged standing)     Sitting on the toilet both times.  5. RECURRENT SYMPTOM: Have you ever passed out before? If Yes, ask: When was the last time? and What happened that time?      Did not assess.  6. INJURY: Did you hurt yourself when you fell?      Head/face. 1 inch split open gash to bottom lip.  7. CARDIAC SYMPTOMS: Have you had any of the following symptoms:  chest pain, difficulty breathing, palpitations?     No.  8. NEUROLOGIC SYMPTOMS: Have you had any of the following symptoms: headache, numbness, vertigo, weakness?     Did not assess.  9. GI SYMPTOMS: Have you had any of the following symptoms: abdomen pain, vomiting, diarrhea, blood in stools?     Nausea. No vomiting or blood in stools.  10. OTHER SYMPTOMS: Do you have any other symptoms?       Patient states he has suspected influenza due to exposure to his granddaughter who tested positive and states now everyone in his family has caught it. He states he has been sick recently with flu like symptoms.  Protocols used: [employer]

## 2024-07-08 ENCOUNTER — Other Ambulatory Visit: Payer: Self-pay | Admitting: Family Medicine

## 2024-07-08 ENCOUNTER — Other Ambulatory Visit: Payer: Self-pay

## 2024-07-08 MED ORDER — ATORVASTATIN CALCIUM 10 MG PO TABS
10.0000 mg | ORAL_TABLET | Freq: Every day | ORAL | 0 refills | Status: AC
  Filled 2024-07-08: qty 90, 90d supply, fill #0

## 2024-07-08 MED ORDER — HYDROCHLOROTHIAZIDE 25 MG PO TABS
25.0000 mg | ORAL_TABLET | Freq: Every day | ORAL | 0 refills | Status: AC
  Filled 2024-07-08: qty 90, 90d supply, fill #0

## 2024-07-28 ENCOUNTER — Other Ambulatory Visit: Payer: Self-pay

## 2024-10-26 ENCOUNTER — Ambulatory Visit: Admitting: Internal Medicine
